# Patient Record
Sex: Male | Born: 1935 | Race: White | Hispanic: No
Health system: Southern US, Community
[De-identification: ages and names within clinical notes are randomized; demographics above are authoritative.]

## PROBLEM LIST (undated history)

## (undated) DIAGNOSIS — C61 Malignant neoplasm of prostate: Secondary | ICD-10-CM

## (undated) DIAGNOSIS — M1990 Unspecified osteoarthritis, unspecified site: Secondary | ICD-10-CM

## (undated) DIAGNOSIS — F0151 Vascular dementia with behavioral disturbance: Secondary | ICD-10-CM

## (undated) DIAGNOSIS — M8448XA Pathological fracture, other site, initial encounter for fracture: Secondary | ICD-10-CM

## (undated) DIAGNOSIS — N400 Enlarged prostate without lower urinary tract symptoms: Secondary | ICD-10-CM

## (undated) DIAGNOSIS — M810 Age-related osteoporosis without current pathological fracture: Secondary | ICD-10-CM

## (undated) DIAGNOSIS — I4892 Unspecified atrial flutter: Secondary | ICD-10-CM

## (undated) DIAGNOSIS — I442 Atrioventricular block, complete: Secondary | ICD-10-CM

## (undated) DIAGNOSIS — I5042 Chronic combined systolic (congestive) and diastolic (congestive) heart failure: Secondary | ICD-10-CM

## (undated) DIAGNOSIS — R634 Abnormal weight loss: Secondary | ICD-10-CM

## (undated) DIAGNOSIS — I509 Heart failure, unspecified: Secondary | ICD-10-CM

## (undated) HISTORY — PX: TONSILLECTOMY: SUR1361

## (undated) HISTORY — DX: Heart failure, unspecified: I50.9

## (undated) HISTORY — DX: Pathological fracture, other site, initial encounter for fracture: M84.48XA

## (undated) HISTORY — DX: Malignant neoplasm of prostate: C61

## (undated) HISTORY — DX: Abnormal weight loss: R63.4

## (undated) HISTORY — DX: Chronic combined systolic (congestive) and diastolic (congestive) heart failure: I50.42

## (undated) HISTORY — DX: Vascular dementia with behavioral disturbance: F01.51

## (undated) HISTORY — DX: Unspecified atrial flutter: I48.92

## (undated) HISTORY — DX: Age-related osteoporosis without current pathological fracture: M81.0

## (undated) HISTORY — PX: ANTERIOR CRUCIATE LIGAMENT REPAIR: SHX115

---

## 2001-05-16 ENCOUNTER — Ambulatory Visit: Admission: RE | Admit: 2001-05-16 | Discharge: 2001-08-14 | Payer: Self-pay | Admitting: Radiation Oncology

## 2001-08-15 ENCOUNTER — Ambulatory Visit: Admission: RE | Admit: 2001-08-15 | Discharge: 2001-11-13 | Payer: Self-pay | Admitting: Radiation Oncology

## 2003-01-20 ENCOUNTER — Ambulatory Visit (HOSPITAL_BASED_OUTPATIENT_CLINIC_OR_DEPARTMENT_OTHER): Admission: RE | Admit: 2003-01-20 | Discharge: 2003-01-20 | Payer: Self-pay | Admitting: Urology

## 2004-03-14 ENCOUNTER — Encounter: Admission: RE | Admit: 2004-03-14 | Discharge: 2004-03-14 | Payer: Self-pay | Admitting: Orthopedic Surgery

## 2004-06-11 LAB — HM COLONOSCOPY

## 2004-07-05 ENCOUNTER — Ambulatory Visit: Payer: Self-pay | Admitting: Internal Medicine

## 2004-07-14 ENCOUNTER — Ambulatory Visit: Payer: Self-pay | Admitting: Internal Medicine

## 2009-07-07 ENCOUNTER — Encounter (INDEPENDENT_AMBULATORY_CARE_PROVIDER_SITE_OTHER): Payer: Self-pay | Admitting: *Deleted

## 2010-02-01 ENCOUNTER — Telehealth: Payer: Self-pay | Admitting: Internal Medicine

## 2010-07-11 NOTE — Progress Notes (Signed)
Summary: Schedule NP3 to Discuss Colonoscopy  Phone Note Outgoing Call Call back at [facility] Phone [phone]   Call placed by: Harlow Mares CMA Duncan Dull),  February 01, 2010 9:51 AM Call placed to: Patient Summary of Call: Left message on patients machine to call back. patient is due for a colonoscopy but will need a office visit to discuss due to age. Initial call taken by: Harlow Mares CMA Duncan Dull),  February 01, 2010 9:52 AM  Follow-up for Phone Call        Patient was told by Dr Jacky Kindle that he did not need a colonoscopy at his age. Follow-up by: Tawni Levy,  February 01, 2010 9:56 AM  Additional Follow-up for Phone Call Additional follow up Details #1::        called patient back and Left a message on patients machine to call back, so i can discuss coming in for an office visit to discuss colonoscopy with Dr. 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 Additional Follow-up by: Harlow Mares CMA Duncan Dull),  February 03, 2010 11:44 AM    Additional Follow-up for Phone Call Additional follow up Details #2::    spoke to the patient he states that he was told by Dr. Jacky Kindle that due to his age he did not need another colonoscopy, I advised him that Dr. Marina Goodell would like to see him in the office to discuss a colonoscopy. He would ike Dr. Jacky Kindle to have an input into if he needs a colonoscopy or not, I advised him I would forward this note to his office and he says he will call them as well. Patient informed he had tubular adenomas on his past colonoscopy in 2006 and he states he is in good heath so i strongly advised him to at least come for an office visit. He still wanted to talk to Dr. Jacky Kindle Follow-up by: Harlow Mares CMA Duncan Dull),  February 03, 2010 2:30 PM

## 2010-07-11 NOTE — Letter (Signed)
Summary: Colonoscopy-Changed to Office Visit Letter  [facility]  [address] [place], [place] [address]   Phone: [phone]  Fax: [phone]      July 07, 2009 MRN: [id number]   KIYOTO SLOMSKI [address] [place], [place]  [address]   Dear Mr. Perillo,   According to our records, it is time for you to schedule a Colonoscopy. However, after reviewing your medical record, I feel that an office visit would be most appropriate to more completely evaluate you and determine your need for a repeat procedure.  Please call [phone] (option #2) at your convenience to schedule an office visit. If you have any questions, concerns, or feel that this letter is in error, we would appreciate your call.   Sincerely,  Wilhemina Bonito. Marina Goodell, M.D.  [facility] [phone]

## 2010-07-12 HISTORY — PX: NM MYOCAR PERF WALL MOTION: HXRAD629

## 2010-08-10 HISTORY — PX: TOTAL HIP ARTHROPLASTY: SHX124

## 2010-08-10 HISTORY — PX: TRANSTHORACIC ECHOCARDIOGRAM: SHX275

## 2010-09-05 ENCOUNTER — Inpatient Hospital Stay (HOSPITAL_COMMUNITY): Payer: Medicare Other

## 2010-09-05 ENCOUNTER — Inpatient Hospital Stay (HOSPITAL_COMMUNITY)
Admission: AD | Admit: 2010-09-05 | Discharge: 2010-09-11 | DRG: 470 | Disposition: A | Discharge status: Home / Self Care | Payer: Medicare Other | Source: Ambulatory Visit | Attending: Orthopedic Surgery | Admitting: Orthopedic Surgery

## 2010-09-05 DIAGNOSIS — I4892 Unspecified atrial flutter: Secondary | ICD-10-CM | POA: Diagnosis not present

## 2010-09-05 DIAGNOSIS — D62 Acute posthemorrhagic anemia: Secondary | ICD-10-CM | POA: Diagnosis not present

## 2010-09-05 DIAGNOSIS — I4891 Unspecified atrial fibrillation: Secondary | ICD-10-CM | POA: Diagnosis present

## 2010-09-05 DIAGNOSIS — I10 Essential (primary) hypertension: Secondary | ICD-10-CM | POA: Diagnosis present

## 2010-09-05 DIAGNOSIS — I4510 Unspecified right bundle-branch block: Secondary | ICD-10-CM | POA: Diagnosis present

## 2010-09-05 DIAGNOSIS — S72009A Fracture of unspecified part of neck of unspecified femur, initial encounter for closed fracture: Principal | ICD-10-CM | POA: Diagnosis present

## 2010-09-05 LAB — TYPE AND SCREEN
ABO/RH(D): A POS
Antibody Screen: NEGATIVE

## 2010-09-05 LAB — CBC
HCT: 40.7 % (ref 39.0–52.0)
Hemoglobin: 14.1 g/dL (ref 13.0–17.0)
MCH: 30.7 pg (ref 26.0–34.0)
MCHC: 34.6 g/dL (ref 30.0–36.0)
MCV: 88.7 fL (ref 78.0–100.0)
Platelets: 206 10*3/uL (ref 150–400)
RBC: 4.59 MIL/uL (ref 4.22–5.81)
RDW: 13.6 % (ref 11.5–15.5)
WBC: 7.4 10*3/uL (ref 4.0–10.5)

## 2010-09-05 LAB — DIFFERENTIAL
Basophils Absolute: 0 10*3/uL (ref 0.0–0.1)
Basophils Relative: 0 % (ref 0–1)
Eosinophils Absolute: 0.1 10*3/uL (ref 0.0–0.7)
Eosinophils Relative: 1 % (ref 0–5)
Lymphocytes Relative: 17 % (ref 12–46)
Lymphs Abs: 1.2 10*3/uL (ref 0.7–4.0)
Monocytes Absolute: 0.8 10*3/uL (ref 0.1–1.0)
Monocytes Relative: 11 % (ref 3–12)
Neutro Abs: 5.2 10*3/uL (ref 1.7–7.7)
Neutrophils Relative %: 71 % (ref 43–77)

## 2010-09-05 LAB — MAGNESIUM: Magnesium: 2.1 mg/dL (ref 1.5–2.5)

## 2010-09-05 LAB — APTT: aPTT: 31 seconds (ref 24–37)

## 2010-09-05 LAB — COMPREHENSIVE METABOLIC PANEL
ALT: 32 U/L (ref 0–53)
AST: 51 U/L — ABNORMAL HIGH (ref 0–37)
Albumin: 3.9 g/dL (ref 3.5–5.2)
Alkaline Phosphatase: 76 U/L (ref 39–117)
BUN: 41 mg/dL — ABNORMAL HIGH (ref 6–23)
CO2: 25 mEq/L (ref 19–32)
Calcium: 9.6 mg/dL (ref 8.4–10.5)
Chloride: 107 mEq/L (ref 96–112)
Creatinine, Ser: 1.18 mg/dL (ref 0.4–1.5)
GFR calc Af Amer: >60 mL/min (ref >60)
GFR calc non Af Amer: 59 mL/min — ABNORMAL LOW (ref >60)
Glucose, Bld: 138 mg/dL — ABNORMAL HIGH (ref 70–99)
Potassium: 3.9 mEq/L (ref 3.5–5.1)
Sodium: 139 mEq/L (ref 135–145)
Total Bilirubin: 1.4 mg/dL — ABNORMAL HIGH (ref 0.3–1.2)
Total Protein: 6.6 g/dL (ref 6.0–8.3)

## 2010-09-05 LAB — SURGICAL PCR SCREEN
MRSA, PCR: NEGATIVE
Staphylococcus aureus: NEGATIVE

## 2010-09-05 LAB — PROTIME-INR
INR: 1.05 (ref 0.00–1.49)
Prothrombin Time: 13.9 seconds (ref 11.6–15.2)

## 2010-09-05 LAB — ABO/RH: ABO/RH(D): A POS

## 2010-09-05 LAB — TSH: TSH: 2.518 u[IU]/mL (ref 0.350–4.500)

## 2010-09-06 ENCOUNTER — Inpatient Hospital Stay (HOSPITAL_COMMUNITY)
Admission: RE | Admit: 2010-09-06 | Payer: PRIVATE HEALTH INSURANCE | Source: Ambulatory Visit | Admitting: Orthopedic Surgery

## 2010-09-06 ENCOUNTER — Inpatient Hospital Stay (HOSPITAL_COMMUNITY): Payer: Medicare Other

## 2010-09-06 ENCOUNTER — Other Ambulatory Visit: Payer: Self-pay | Admitting: Orthopedic Surgery

## 2010-09-06 LAB — COMPREHENSIVE METABOLIC PANEL
ALT: 21 U/L (ref 0–53)
AST: 32 U/L (ref 0–37)
Albumin: 2.8 g/dL — ABNORMAL LOW (ref 3.5–5.2)
Alkaline Phosphatase: 57 U/L (ref 39–117)
BUN: 34 mg/dL — ABNORMAL HIGH (ref 6–23)
CO2: 25 mEq/L (ref 19–32)
Calcium: 8.6 mg/dL (ref 8.4–10.5)
Chloride: 111 mEq/L (ref 96–112)
Creatinine, Ser: 1.19 mg/dL (ref 0.4–1.5)
GFR calc Af Amer: >60 mL/min (ref >60)
GFR calc non Af Amer: 59 mL/min — ABNORMAL LOW (ref >60)
Glucose, Bld: 95 mg/dL (ref 70–99)
Potassium: 3.8 mEq/L (ref 3.5–5.1)
Sodium: 141 mEq/L (ref 135–145)
Total Bilirubin: 0.7 mg/dL (ref 0.3–1.2)
Total Protein: 4.9 g/dL — ABNORMAL LOW (ref 6.0–8.3)

## 2010-09-06 LAB — CBC
HCT: 35.6 % — ABNORMAL LOW (ref 39.0–52.0)
Hemoglobin: 11.9 g/dL — ABNORMAL LOW (ref 13.0–17.0)
MCH: 30.1 pg (ref 26.0–34.0)
MCHC: 33.4 g/dL (ref 30.0–36.0)
MCV: 89.9 fL (ref 78.0–100.0)
Platelets: 176 10*3/uL (ref 150–400)
RBC: 3.96 MIL/uL — ABNORMAL LOW (ref 4.22–5.81)
RDW: 13.8 % (ref 11.5–15.5)
WBC: 5.2 10*3/uL (ref 4.0–10.5)

## 2010-09-07 LAB — BASIC METABOLIC PANEL
BUN: 18 mg/dL (ref 6–23)
CO2: 28 mEq/L (ref 19–32)
Calcium: 8.3 mg/dL — ABNORMAL LOW (ref 8.4–10.5)
Chloride: 105 mEq/L (ref 96–112)
Creatinine, Ser: 0.94 mg/dL (ref 0.4–1.5)
GFR calc Af Amer: >60 mL/min (ref >60)
GFR calc non Af Amer: >60 mL/min (ref >60)
Glucose, Bld: 109 mg/dL — ABNORMAL HIGH (ref 70–99)
Potassium: 3.8 mEq/L (ref 3.5–5.1)
Sodium: 140 mEq/L (ref 135–145)

## 2010-09-07 LAB — CBC
HCT: 30.7 % — ABNORMAL LOW (ref 39.0–52.0)
Hemoglobin: 10.3 g/dL — ABNORMAL LOW (ref 13.0–17.0)
MCH: 29.9 pg (ref 26.0–34.0)
MCHC: 33.6 g/dL (ref 30.0–36.0)
MCV: 89 fL (ref 78.0–100.0)
Platelets: 173 10*3/uL (ref 150–400)
RBC: 3.45 MIL/uL — ABNORMAL LOW (ref 4.22–5.81)
RDW: 13.7 % (ref 11.5–15.5)
WBC: 5.5 10*3/uL (ref 4.0–10.5)

## 2010-09-08 LAB — CBC
HCT: 30.6 % — ABNORMAL LOW (ref 39.0–52.0)
Hemoglobin: 10.2 g/dL — ABNORMAL LOW (ref 13.0–17.0)
MCH: 29.9 pg (ref 26.0–34.0)
MCHC: 33.3 g/dL (ref 30.0–36.0)
MCV: 89.7 fL (ref 78.0–100.0)
Platelets: 166 10*3/uL (ref 150–400)
RBC: 3.41 MIL/uL — ABNORMAL LOW (ref 4.22–5.81)
RDW: 13.8 % (ref 11.5–15.5)
WBC: 7 10*3/uL (ref 4.0–10.5)

## 2010-09-08 LAB — BASIC METABOLIC PANEL
BUN: 17 mg/dL (ref 6–23)
CO2: 27 mEq/L (ref 19–32)
Calcium: 8.4 mg/dL (ref 8.4–10.5)
Chloride: 105 mEq/L (ref 96–112)
Creatinine, Ser: 0.94 mg/dL (ref 0.4–1.5)
GFR calc Af Amer: >60 mL/min (ref >60)
GFR calc non Af Amer: >60 mL/min (ref >60)
Glucose, Bld: 121 mg/dL — ABNORMAL HIGH (ref 70–99)
Potassium: 3.8 mEq/L (ref 3.5–5.1)
Sodium: 139 mEq/L (ref 135–145)

## 2010-09-09 LAB — BASIC METABOLIC PANEL
BUN: 22 mg/dL (ref 6–23)
CO2: 29 mEq/L (ref 19–32)
Chloride: 107 mEq/L (ref 96–112)
Creatinine, Ser: 1 mg/dL (ref 0.4–1.5)
GFR calc Af Amer: >60 mL/min (ref >60)
GFR calc non Af Amer: >60 mL/min (ref >60)
Glucose, Bld: 109 mg/dL — ABNORMAL HIGH (ref 70–99)
Potassium: 4.1 mEq/L (ref 3.5–5.1)
Sodium: 141 mEq/L (ref 135–145)

## 2010-09-09 LAB — CBC
HCT: 28.5 % — ABNORMAL LOW (ref 39.0–52.0)
MCH: 30.1 pg (ref 26.0–34.0)
MCHC: 33.7 g/dL (ref 30.0–36.0)
MCV: 89.3 fL (ref 78.0–100.0)
Platelets: 178 10*3/uL (ref 150–400)
RBC: 3.19 MIL/uL — ABNORMAL LOW (ref 4.22–5.81)
RDW: 13.7 % (ref 11.5–15.5)
WBC: 7.2 10*3/uL (ref 4.0–10.5)

## 2010-09-10 LAB — PROTIME-INR: INR: 1.11 (ref 0.00–1.49)

## 2010-09-11 LAB — URINALYSIS, ROUTINE W REFLEX MICROSCOPIC
Glucose, UA: NEGATIVE mg/dL
Protein, ur: NEGATIVE mg/dL
Specific Gravity, Urine: 1.022 (ref 1.005–1.030)
pH: 6 (ref 5.0–8.0)

## 2010-09-19 NOTE — Consult Note (Signed)
NAME:  Justin Orozco, Justin Orozco NO.:  [id number]  MEDICAL RECORD NO.:  [id number]           PATIENT TYPE:  I  LOCATION:  5034                         FACILITY:  [facility]  PHYSICIAN:  Nanetta Batty, M.D.   DATE OF BIRTH:  10-20-1926  DATE OF CONSULTATION:  09/05/2010 DATE OF DISCHARGE:                                CONSULTATION   Justin Orozco is a pleasant 77 year old male who was referred to Dr. Allyson Sabal earlier this year for an incidental finding of atrial fibrillation. Apparently, the patient has paroxysmal atrial fibrillation with a sick sinus component.  On the monitor as an outpatient, he had a 3.7-second pause but he was asymptomatic.  He denies any syncope or chest pain or unusual dyspnea.  He did have a Myoview study in our office in February of this year that was low risk with diaphragmatic attenuation.  It was not gaited because of paroxysmal atrial flutter.  He was actually scheduled to have an echocardiogram in our office today.  He went to Dr. Candise Bowens office this morning with complaints of some hip pain and apparently the patient has a fracture in his hip.  He is admitted now for hip surgery tomorrow and we are asked to see him for preop clearance.  As noted above, the patient has no chest pain, shortness of breath, dizziness, or syncope.  PAST MEDICAL HISTORY:  Remarkable for BPH.  HOME MEDICATIONS:  As of our last office visit are aspirin 325 mg a day, metoprolol 25 mg b.i.d., and Flomax 0.4 mg a day.  ALLERGIES:  He has no known drug allergies.  SOCIAL HISTORY:  He is widowed with 2 children, 4 grandchildren.  He is nonsmoker, nondrinker, he drinks alcohol occasionally.  FAMILY HISTORY:  Unremarkable.  REVIEW OF SYSTEMS:  Essentially unremarkable except for noted above, there is no history of diabetes.  PHYSICAL EXAMINATION:  VITAL SIGNS:  Blood pressure 128/72, pulse 72, respirations 12. GENERAL:  He is an elderly male in no acute distress. HEENT:   Normocephalic.  Extraocular movements are intact.  Sclerae are nonicteric. NECK:  Without JVD or bruit. CHEST:  Clear to auscultation and percussion. CARDIAC:  Reveals regular rate and rhythm without obvious murmur, rub, or gallop.  Normal S1, S2. ABDOMEN:  Nontender, not distended. EXTREMITIES:  Reveal right hip pain. NEURO:  Grossly intact.  He is awake, alert, oriented, and cooperative.  Labs are pending.  His EKG September 05, 2010 shows sinus rhythm with a right bundle branch block and left anterior fascicular block.  IMPRESSION: 1. Paroxysmal atrial fibrillation and sick sinus syndrome, currently     in sinus rhythm. 2. Right bundle branch block. 3. Low-risk Myoview February 2012. 4. Right hip fracture.  PLAN:  The patient was seen by Dr. Lynnea Ferrier and myself.  We will go ahead and get his echocardiogram this afternoon.  Pending any unexpected findings on his echocardiogram, he will be cleared for surgery.  He will need telemetry postop.  We will follow along during his hospitalization.     Abelino Derrick, P.A.  ______________________________ Nanetta Batty, M.D.    Lenard Lance  D:  09/05/2010  T:  09/05/2010  Job:  [id number]  cc:   Nanetta Batty, M.D. Geoffry Paradise, M.D.  Electronically Signed by Corine Shelter P.A. on 09/08/2010 02:42:53 PM Electronically Signed by Nanetta Batty M.D. on 09/19/2010 10:45:42 AM

## 2010-10-09 NOTE — Op Note (Signed)
  NAME:  Justin Orozco, Justin Orozco NO.:  [id number]  MEDICAL RECORD NO.:  [id number]           PATIENT TYPE:  I  LOCATION:  4708                         FACILITY:  [facility]  PHYSICIAN:  Dyke Brackett, M.D.    DATE OF BIRTH:  September 13, 1926  DATE OF PROCEDURE:  09/06/2010 DATE OF DISCHARGE:                              OPERATIVE REPORT   PREOPERATIVE DIAGNOSIS:  Right displaced femoral neck fracture.  POSTOPERATIVE DIAGNOSIS:  Right displaced femoral neck fracture.  OPERATION:  Right DePuy hemiarthroplasty (Prodigy porous-coated stem, 15 large stature, +0 neck length, and 49-mm unipolar hip ball).  SURGEON:  Dyke Brackett, MD  ASSISTANTS:  [name]. Su Hilt, [place]  BLOOD LOSS:  300.  General anesthetic.  DESCRIPTION OF THE PROCEDURE:  Sterile prep and drape, lateral position, posterior approach to the hip made with a splitting iliotibial band, gluteus maximus fascia.  Identification of the sciatic nerve, followed by splitting the short external rotators of the hip.  Capsulotomy of the capsule revealing a rather high vertically oriented neck fracture, appeared to be more consistent with osteoporosis, but we did send the head to Pathology.  Head was removed with excess bone entrapments from the acetabulum.  The acetabulum showed no significant degenerative change.  We then proceeded to size the acetabulum and the head to be 49 mm.  Following this, we progressively reamed up to accept a 50-mm large stature stem with the appropriate reamers to 14.5 and appropriate broach obtaining good press fit, particularly given the patient's age of approximately 50 that he did have reasonably good bone with a Press-Fit. Again, all bony surfaces were irrigated.  We trialed the prosthesis with the broach with +0 neck length being deemed appropriate with excellent stability noted.  We followed this with placement of the real prosthesis with the appropriate anteversion, porous-coated Prodigy  stem.  We did note that the Prodigy provided slightly more stability than the AML stem and elected to use the Prodigy of the above-mentioned neck lengths.  The final prosthesis was inserted followed by one additional trailing with appropriate neck length.  We then placed the final head and then checked all parameters were excellent.  It could be dislocated with probably greater than 90 degrees flexion, 30-40 degrees adduction, and greater than 60 degrees of internal rotation.  T- capsulotomy was repaired with interrupted Ethibond.  The fascia lata with a running #1 Ethibond, subcutaneous tissues with 2-0 Vicryl and skin clips.  Marcaine with epinephrine was then infiltrated in the skin and placed in sterile dressing, knee immobilizer, taken to the recovery room in stable condition.     Dyke Brackett, M.D.     WDC/MEDQ  D:  09/06/2010  T:  09/07/2010  Job:  [id number]  Electronically Signed by W. Jerrett Baldinger M.D. on 10/09/2010 05:11:25 PM

## 2010-10-26 NOTE — Discharge Summary (Signed)
NAME:  Justin Orozco, Justin Orozco NO.:  [id number]  MEDICAL RECORD NO.:  [id number]           PATIENT TYPE:  I  LOCATION:  5004                         FACILITY:  [facility]  PHYSICIAN:  Justin Orozco, M.D.    DATE OF BIRTH:  [birth year]  DATE OF ADMISSION:  09/05/2010 DATE OF DISCHARGE:  09/11/2010                              DISCHARGE SUMMARY   DISCHARGE SUMMARY:  The patient is an 77 year old with a 2-week history of right hip pain who was initially able to ambulate, but pain worsened significantly over the last several days prior to this admission. Office visit on September 05, 2010, found a right femoral neck fracture, and the patient will be admitted for right hemi hip arthroplasty due to the fracture.  PAST MEDICAL HISTORY:  Significant for benign prostatic hypertrophy, AFib, and right bundle-branch block for his cardiac history.  FAMILY HISTORY:  Unremarkable.  SOCIAL HISTORY:  The patient is a widower who lives alone with supportive family nearby.  He does use occasional alcohol with no tobacco.  No known drug allergies.  MEDICATIONS ON ADMISSION: 1. Aspirin 325 mg daily. 2. Metoprolol 25 mg b.i.d. 3. Flomax 0.4 mg daily.  REVIEW OF SYSTEMS:  Positive for glasses.  He does not have dentures. He denies any shortness of breath, chest pain, or recent illness.  PHYSICAL EXAMINATION:  VITAL SIGNS:  The patient is afebrile, pulse 72, respirations are 12, blood pressure 128/72.  He is 5 feet 9 inches, 144- pound male. NECK:  Full range of motion. CHEST:  Clear to auscultation. HEART:  Regular rate and rhythm. ABDOMEN:  Bowel sounds 2+ and nondistended.  No masses. EXTREMITIES:  Right lower extremity with pain with any attempted movement.  He is otherwise neurovascularly intact to both lower extremities. SKIN:  Within normal limits. NEUROLOGIC:  The patient intact to light touch and moves all extremities.  He is alert and oriented x3.  Cranial nerves II-XII  are intact.  PREOPERATIVE LABORATORIES:  Hemoglobin of 14.1, WBC was 7.4.  PT is 13.9, INR is 31.  BUN of 41, and CO2 of 51.  X-rays show a right femoral neck fracture.  The patient was admitted for pain control the night prior to the procedure as well as a visit from his cardiologist to make sure that he was cleared for surgery due to his previous history of AFib and bundle branch block.  HOSPITAL COURSE:  The patient received clearance for surgery on September 05, 2010, and his surgery was planned for September 06, 2010.  On September 06, 2010, the patient underwent a right hemi hip arthroplasty with a 48-mm head, Prodigy stem by DePuy with 15 mm diameter and large stature and a tapered spacer of 0 mm.  The patient was placed on perioperative antibiotics.  She was placed on postoperative Lovenox prophylaxis who began physical therapy the day of surgery.  POSTOPERATIVE COURSE:  On first postoperative day, the patient was awake and alert, complaining of moderate pain.  No complaints of nausea or vomiting.  Wound was clean and dry.  He was on the telemetry floor for cardiac monitoring and did  show some abnormal rhythms, which kept him on the cardiac floor for continued monitoring and safekeeping.  He was able to undergo at least passive physical therapy in bed, however.  On postoperative day #2, the patient was clear to transfer to 5000 if okayed by Cardiology.  He continued complaining of only moderate pain and was tolerating diet without difficulty.  On postoperative day #3, the patient was able to transfer to the orthopedic floor.  He was making steady progress with physical therapy.  Wound was clean and dry.  With supportive family, it was felt he was cleared to be discharged to his home in their care.  At the time of his discharge, his wound was clean and dry.  He was medically stable and improving orthopedically.  ACTIVITIES:  Weightbearing as tolerated with walker.  Dressing changes  daily or as needed to keep the wound clean and dry and covered.  Diet was regular.  DISCHARGE MEDICATIONS: 1. Tylenol one to two pills by mouth every 6 hours as needed for     temperature greater than 101. 2. Vicodin 5/325 one to two tablets by mouth every 4 hours as needed     for pain. 3. TED hose for continued DVT prophylaxis. 4. Methocarbamol 500 mg one p.o. q.6 h. as needed for spasm. 5. Coumadin as directed by home pharmacy per pharmacy protocol for     prophylaxis. 6. Metoprolol 50 mg one-half tablet by mouth b.i.d. 7. Tamsulosin 0.4 mg one p.o. daily.  The patient will follow up with Dr. Madelon Orozco in approximately 1 week to 10 days.  He will follow up with Cardiology per their schedule, and they will call him for that appointment.  He will return sooner if there is any increase in temperature greater than 101, any uncontrolled drainage, or any increased swelling or pain around the area of the wound of the hip.  Once again, the diagnosis for this admission was right hip femoral neck fracture.  PROCEDURE:  Right hemi hip arthroplasty.     [name]. Justin Orozco   ______________________________ Justin Orozco, M.D.   JBR/MEDQ  D:  10/11/2010  T:  10/12/2010  Job:  [id number]  Electronically Signed by Justin Orozco P.A. on 10/23/2010 01:31:09 PM Electronically Signed by Justin Orozco M.D. on 10/26/2010 11:13:02 AM

## 2011-02-26 ENCOUNTER — Other Ambulatory Visit: Payer: Self-pay | Admitting: Dermatology

## 2011-07-09 DIAGNOSIS — Z79899 Other long term (current) drug therapy: Secondary | ICD-10-CM | POA: Diagnosis not present

## 2011-07-09 DIAGNOSIS — M810 Age-related osteoporosis without current pathological fracture: Secondary | ICD-10-CM | POA: Diagnosis not present

## 2011-07-09 DIAGNOSIS — E785 Hyperlipidemia, unspecified: Secondary | ICD-10-CM | POA: Diagnosis not present

## 2011-07-09 DIAGNOSIS — Z125 Encounter for screening for malignant neoplasm of prostate: Secondary | ICD-10-CM | POA: Diagnosis not present

## 2011-07-16 DIAGNOSIS — I4892 Unspecified atrial flutter: Secondary | ICD-10-CM | POA: Diagnosis not present

## 2011-07-16 DIAGNOSIS — E785 Hyperlipidemia, unspecified: Secondary | ICD-10-CM | POA: Diagnosis not present

## 2011-07-16 DIAGNOSIS — Z0000 Encounter for general adult medical examination without abnormal findings: Secondary | ICD-10-CM | POA: Diagnosis not present

## 2011-07-16 DIAGNOSIS — M1990 Unspecified osteoarthritis, unspecified site: Secondary | ICD-10-CM | POA: Diagnosis not present

## 2011-07-17 DIAGNOSIS — Z1212 Encounter for screening for malignant neoplasm of rectum: Secondary | ICD-10-CM | POA: Diagnosis not present

## 2011-07-19 DIAGNOSIS — Z125 Encounter for screening for malignant neoplasm of prostate: Secondary | ICD-10-CM | POA: Diagnosis not present

## 2011-08-15 DIAGNOSIS — M999 Biomechanical lesion, unspecified: Secondary | ICD-10-CM | POA: Diagnosis not present

## 2011-08-15 DIAGNOSIS — S239XXA Sprain of unspecified parts of thorax, initial encounter: Secondary | ICD-10-CM | POA: Diagnosis not present

## 2011-08-15 DIAGNOSIS — M47817 Spondylosis without myelopathy or radiculopathy, lumbosacral region: Secondary | ICD-10-CM | POA: Diagnosis not present

## 2011-09-04 DIAGNOSIS — L578 Other skin changes due to chronic exposure to nonionizing radiation: Secondary | ICD-10-CM | POA: Diagnosis not present

## 2011-09-04 DIAGNOSIS — L570 Actinic keratosis: Secondary | ICD-10-CM | POA: Diagnosis not present

## 2011-09-04 DIAGNOSIS — L821 Other seborrheic keratosis: Secondary | ICD-10-CM | POA: Diagnosis not present

## 2011-11-26 DIAGNOSIS — H2510 Age-related nuclear cataract, unspecified eye: Secondary | ICD-10-CM | POA: Diagnosis not present

## 2011-11-27 ENCOUNTER — Other Ambulatory Visit: Payer: Self-pay | Admitting: Dermatology

## 2011-11-27 DIAGNOSIS — L578 Other skin changes due to chronic exposure to nonionizing radiation: Secondary | ICD-10-CM | POA: Diagnosis not present

## 2011-11-27 DIAGNOSIS — L570 Actinic keratosis: Secondary | ICD-10-CM | POA: Diagnosis not present

## 2011-11-27 DIAGNOSIS — C44721 Squamous cell carcinoma of skin of unspecified lower limb, including hip: Secondary | ICD-10-CM | POA: Diagnosis not present

## 2011-11-27 DIAGNOSIS — L821 Other seborrheic keratosis: Secondary | ICD-10-CM | POA: Diagnosis not present

## 2011-11-27 DIAGNOSIS — L819 Disorder of pigmentation, unspecified: Secondary | ICD-10-CM | POA: Diagnosis not present

## 2011-11-28 DIAGNOSIS — S239XXA Sprain of unspecified parts of thorax, initial encounter: Secondary | ICD-10-CM | POA: Diagnosis not present

## 2011-11-28 DIAGNOSIS — M47817 Spondylosis without myelopathy or radiculopathy, lumbosacral region: Secondary | ICD-10-CM | POA: Diagnosis not present

## 2011-11-28 DIAGNOSIS — M999 Biomechanical lesion, unspecified: Secondary | ICD-10-CM | POA: Diagnosis not present

## 2011-12-19 DIAGNOSIS — I4891 Unspecified atrial fibrillation: Secondary | ICD-10-CM | POA: Diagnosis not present

## 2012-02-21 DIAGNOSIS — Z23 Encounter for immunization: Secondary | ICD-10-CM | POA: Diagnosis not present

## 2012-02-28 DIAGNOSIS — S239XXA Sprain of unspecified parts of thorax, initial encounter: Secondary | ICD-10-CM | POA: Diagnosis not present

## 2012-02-28 DIAGNOSIS — M999 Biomechanical lesion, unspecified: Secondary | ICD-10-CM | POA: Diagnosis not present

## 2012-02-28 DIAGNOSIS — M47817 Spondylosis without myelopathy or radiculopathy, lumbosacral region: Secondary | ICD-10-CM | POA: Diagnosis not present

## 2012-03-04 DIAGNOSIS — L821 Other seborrheic keratosis: Secondary | ICD-10-CM | POA: Diagnosis not present

## 2012-03-04 DIAGNOSIS — L570 Actinic keratosis: Secondary | ICD-10-CM | POA: Diagnosis not present

## 2012-03-04 DIAGNOSIS — L820 Inflamed seborrheic keratosis: Secondary | ICD-10-CM | POA: Diagnosis not present

## 2012-03-04 DIAGNOSIS — L819 Disorder of pigmentation, unspecified: Secondary | ICD-10-CM | POA: Diagnosis not present

## 2012-03-04 DIAGNOSIS — Z85828 Personal history of other malignant neoplasm of skin: Secondary | ICD-10-CM | POA: Diagnosis not present

## 2012-03-04 DIAGNOSIS — L578 Other skin changes due to chronic exposure to nonionizing radiation: Secondary | ICD-10-CM | POA: Diagnosis not present

## 2012-04-23 DIAGNOSIS — C61 Malignant neoplasm of prostate: Secondary | ICD-10-CM | POA: Diagnosis not present

## 2012-04-27 IMAGING — CR DG HIP 1V PORT*R*
1 series · 1 of 1 positions shown · non-contrast
Comparison: None.

CLINICAL DATA: Right hip pain

PORTABLE RIGHT HIP - 1 VIEW

[view not recorded]
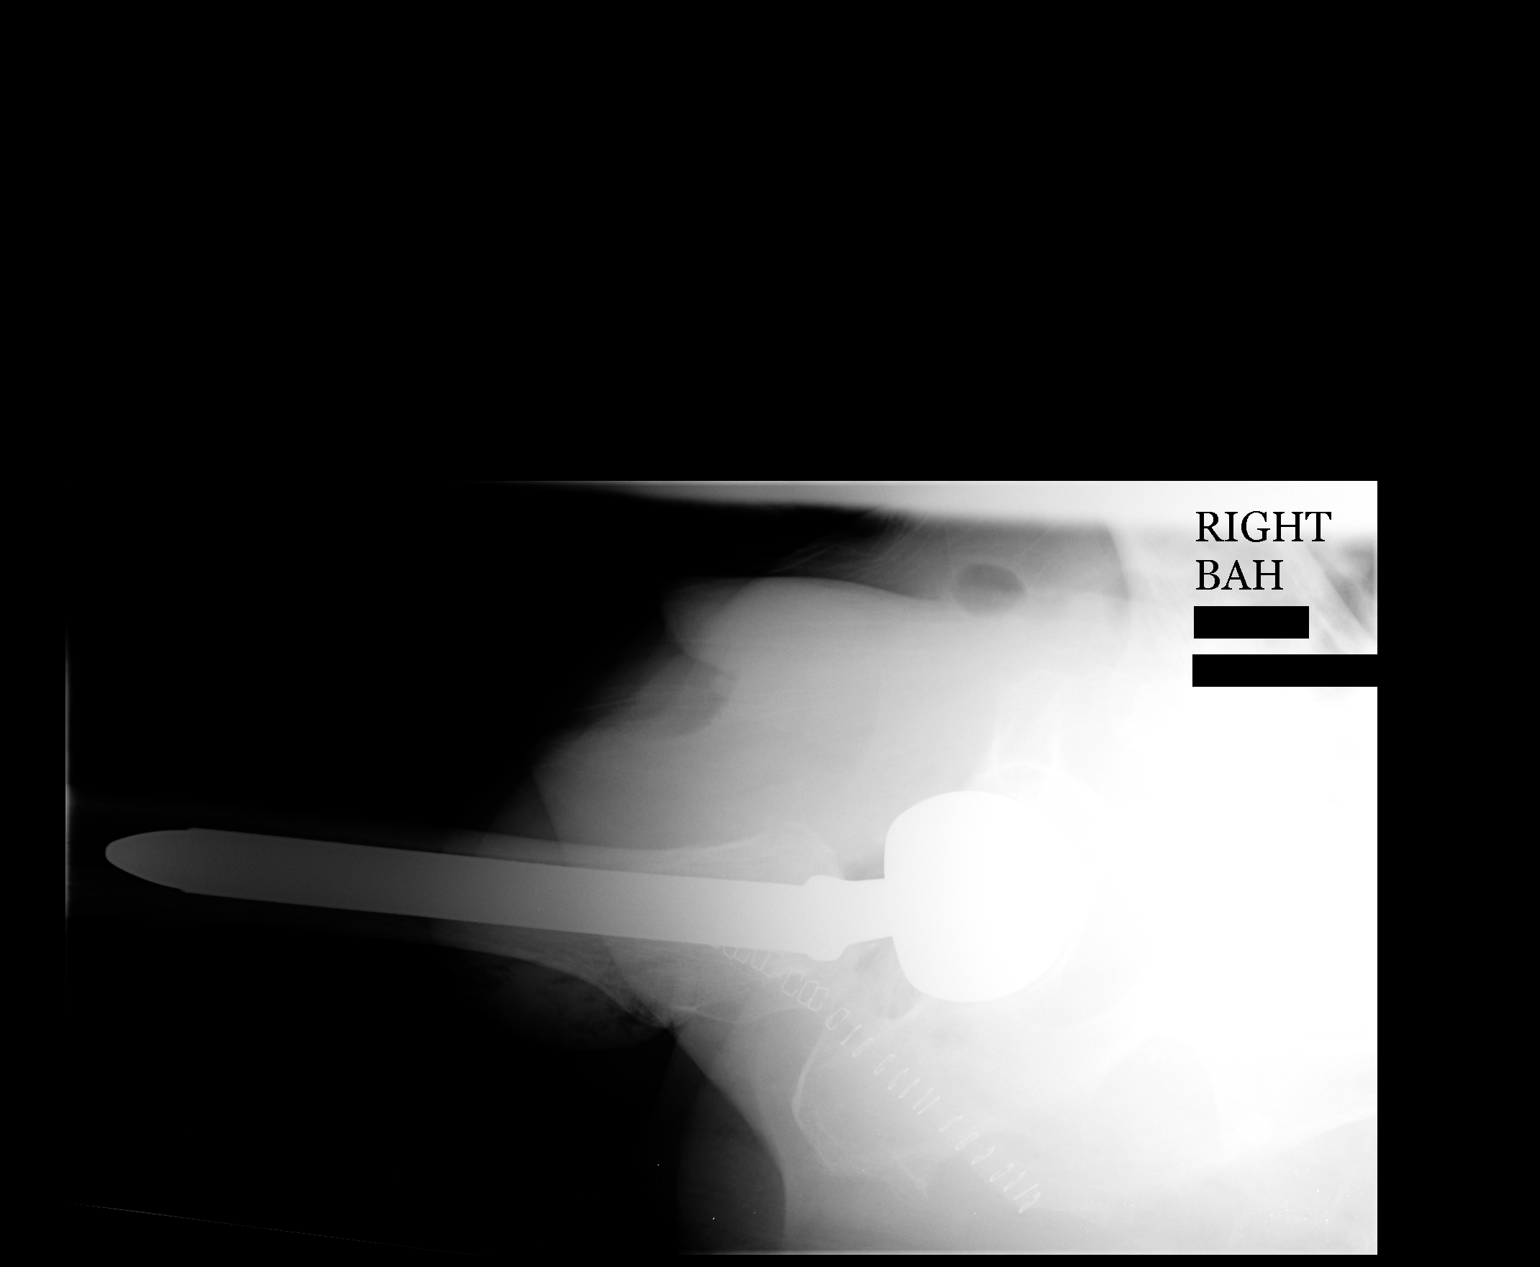

[1 of 1 positions shown; findings below may reference images not displayed]

FINDINGS: Lateral view status post right total hip replacement
demonstrates satisfactory position and alignment.
IMPRESSION: As above.

## 2012-04-30 DIAGNOSIS — N320 Bladder-neck obstruction: Secondary | ICD-10-CM | POA: Diagnosis not present

## 2012-04-30 DIAGNOSIS — C61 Malignant neoplasm of prostate: Secondary | ICD-10-CM | POA: Diagnosis not present

## 2012-05-27 ENCOUNTER — Other Ambulatory Visit: Payer: Self-pay | Admitting: Dermatology

## 2012-05-27 DIAGNOSIS — L819 Disorder of pigmentation, unspecified: Secondary | ICD-10-CM | POA: Diagnosis not present

## 2012-05-27 DIAGNOSIS — C44319 Basal cell carcinoma of skin of other parts of face: Secondary | ICD-10-CM | POA: Diagnosis not present

## 2012-05-27 DIAGNOSIS — L259 Unspecified contact dermatitis, unspecified cause: Secondary | ICD-10-CM | POA: Diagnosis not present

## 2012-05-27 DIAGNOSIS — L821 Other seborrheic keratosis: Secondary | ICD-10-CM | POA: Diagnosis not present

## 2012-05-27 DIAGNOSIS — D485 Neoplasm of uncertain behavior of skin: Secondary | ICD-10-CM | POA: Diagnosis not present

## 2012-05-27 DIAGNOSIS — L578 Other skin changes due to chronic exposure to nonionizing radiation: Secondary | ICD-10-CM | POA: Diagnosis not present

## 2012-06-27 DIAGNOSIS — S239XXA Sprain of unspecified parts of thorax, initial encounter: Secondary | ICD-10-CM | POA: Diagnosis not present

## 2012-06-27 DIAGNOSIS — M999 Biomechanical lesion, unspecified: Secondary | ICD-10-CM | POA: Diagnosis not present

## 2012-06-27 DIAGNOSIS — M47817 Spondylosis without myelopathy or radiculopathy, lumbosacral region: Secondary | ICD-10-CM | POA: Diagnosis not present

## 2012-06-27 DIAGNOSIS — S335XXA Sprain of ligaments of lumbar spine, initial encounter: Secondary | ICD-10-CM | POA: Diagnosis not present

## 2012-07-10 DIAGNOSIS — E785 Hyperlipidemia, unspecified: Secondary | ICD-10-CM | POA: Diagnosis not present

## 2012-07-10 DIAGNOSIS — Z79899 Other long term (current) drug therapy: Secondary | ICD-10-CM | POA: Diagnosis not present

## 2012-07-10 DIAGNOSIS — Z125 Encounter for screening for malignant neoplasm of prostate: Secondary | ICD-10-CM | POA: Diagnosis not present

## 2012-07-10 DIAGNOSIS — M810 Age-related osteoporosis without current pathological fracture: Secondary | ICD-10-CM | POA: Diagnosis not present

## 2012-07-16 DIAGNOSIS — I4892 Unspecified atrial flutter: Secondary | ICD-10-CM | POA: Diagnosis not present

## 2012-07-16 DIAGNOSIS — Z0000 Encounter for general adult medical examination without abnormal findings: Secondary | ICD-10-CM | POA: Diagnosis not present

## 2012-07-16 DIAGNOSIS — Z79899 Other long term (current) drug therapy: Secondary | ICD-10-CM | POA: Diagnosis not present

## 2012-07-16 DIAGNOSIS — Z23 Encounter for immunization: Secondary | ICD-10-CM | POA: Diagnosis not present

## 2012-07-16 DIAGNOSIS — Z1331 Encounter for screening for depression: Secondary | ICD-10-CM | POA: Diagnosis not present

## 2012-07-16 DIAGNOSIS — E785 Hyperlipidemia, unspecified: Secondary | ICD-10-CM | POA: Diagnosis not present

## 2012-07-18 DIAGNOSIS — Z1212 Encounter for screening for malignant neoplasm of rectum: Secondary | ICD-10-CM | POA: Diagnosis not present

## 2012-07-28 DIAGNOSIS — C44319 Basal cell carcinoma of skin of other parts of face: Secondary | ICD-10-CM | POA: Diagnosis not present

## 2012-09-19 DIAGNOSIS — S335XXA Sprain of ligaments of lumbar spine, initial encounter: Secondary | ICD-10-CM | POA: Diagnosis not present

## 2012-09-19 DIAGNOSIS — M47817 Spondylosis without myelopathy or radiculopathy, lumbosacral region: Secondary | ICD-10-CM | POA: Diagnosis not present

## 2012-09-19 DIAGNOSIS — S239XXA Sprain of unspecified parts of thorax, initial encounter: Secondary | ICD-10-CM | POA: Diagnosis not present

## 2012-09-19 DIAGNOSIS — M999 Biomechanical lesion, unspecified: Secondary | ICD-10-CM | POA: Diagnosis not present

## 2012-11-25 DIAGNOSIS — L819 Disorder of pigmentation, unspecified: Secondary | ICD-10-CM | POA: Diagnosis not present

## 2012-11-25 DIAGNOSIS — L578 Other skin changes due to chronic exposure to nonionizing radiation: Secondary | ICD-10-CM | POA: Diagnosis not present

## 2012-11-25 DIAGNOSIS — L821 Other seborrheic keratosis: Secondary | ICD-10-CM | POA: Diagnosis not present

## 2012-11-25 DIAGNOSIS — Z85828 Personal history of other malignant neoplasm of skin: Secondary | ICD-10-CM | POA: Diagnosis not present

## 2012-12-03 DIAGNOSIS — H35379 Puckering of macula, unspecified eye: Secondary | ICD-10-CM | POA: Diagnosis not present

## 2012-12-03 DIAGNOSIS — H2510 Age-related nuclear cataract, unspecified eye: Secondary | ICD-10-CM | POA: Diagnosis not present

## 2012-12-26 DIAGNOSIS — M47817 Spondylosis without myelopathy or radiculopathy, lumbosacral region: Secondary | ICD-10-CM | POA: Diagnosis not present

## 2012-12-26 DIAGNOSIS — S335XXA Sprain of ligaments of lumbar spine, initial encounter: Secondary | ICD-10-CM | POA: Diagnosis not present

## 2012-12-26 DIAGNOSIS — M999 Biomechanical lesion, unspecified: Secondary | ICD-10-CM | POA: Diagnosis not present

## 2012-12-26 DIAGNOSIS — S239XXA Sprain of unspecified parts of thorax, initial encounter: Secondary | ICD-10-CM | POA: Diagnosis not present

## 2013-02-25 DIAGNOSIS — Z85828 Personal history of other malignant neoplasm of skin: Secondary | ICD-10-CM | POA: Diagnosis not present

## 2013-02-25 DIAGNOSIS — L723 Sebaceous cyst: Secondary | ICD-10-CM | POA: Diagnosis not present

## 2013-02-25 DIAGNOSIS — L570 Actinic keratosis: Secondary | ICD-10-CM | POA: Diagnosis not present

## 2013-02-25 DIAGNOSIS — L821 Other seborrheic keratosis: Secondary | ICD-10-CM | POA: Diagnosis not present

## 2013-03-27 DIAGNOSIS — Z23 Encounter for immunization: Secondary | ICD-10-CM | POA: Diagnosis not present

## 2013-04-01 DIAGNOSIS — M47817 Spondylosis without myelopathy or radiculopathy, lumbosacral region: Secondary | ICD-10-CM | POA: Diagnosis not present

## 2013-04-01 DIAGNOSIS — M999 Biomechanical lesion, unspecified: Secondary | ICD-10-CM | POA: Diagnosis not present

## 2013-04-01 DIAGNOSIS — S239XXA Sprain of unspecified parts of thorax, initial encounter: Secondary | ICD-10-CM | POA: Diagnosis not present

## 2013-05-01 DIAGNOSIS — C61 Malignant neoplasm of prostate: Secondary | ICD-10-CM | POA: Diagnosis not present

## 2013-05-01 DIAGNOSIS — R351 Nocturia: Secondary | ICD-10-CM | POA: Diagnosis not present

## 2013-05-01 DIAGNOSIS — Z8546 Personal history of malignant neoplasm of prostate: Secondary | ICD-10-CM | POA: Diagnosis not present

## 2013-07-04 ENCOUNTER — Other Ambulatory Visit: Payer: Self-pay | Admitting: Cardiovascular Disease

## 2013-07-13 DIAGNOSIS — E785 Hyperlipidemia, unspecified: Secondary | ICD-10-CM | POA: Diagnosis not present

## 2013-07-13 DIAGNOSIS — M810 Age-related osteoporosis without current pathological fracture: Secondary | ICD-10-CM | POA: Diagnosis not present

## 2013-07-13 DIAGNOSIS — Z125 Encounter for screening for malignant neoplasm of prostate: Secondary | ICD-10-CM | POA: Diagnosis not present

## 2013-07-13 DIAGNOSIS — Z79899 Other long term (current) drug therapy: Secondary | ICD-10-CM | POA: Diagnosis not present

## 2013-07-17 DIAGNOSIS — E785 Hyperlipidemia, unspecified: Secondary | ICD-10-CM | POA: Diagnosis not present

## 2013-07-17 DIAGNOSIS — Z8546 Personal history of malignant neoplasm of prostate: Secondary | ICD-10-CM | POA: Diagnosis not present

## 2013-07-17 DIAGNOSIS — M1990 Unspecified osteoarthritis, unspecified site: Secondary | ICD-10-CM | POA: Diagnosis not present

## 2013-07-17 DIAGNOSIS — Z1331 Encounter for screening for depression: Secondary | ICD-10-CM | POA: Diagnosis not present

## 2013-07-17 DIAGNOSIS — I4892 Unspecified atrial flutter: Secondary | ICD-10-CM | POA: Diagnosis not present

## 2013-07-17 DIAGNOSIS — IMO0002 Reserved for concepts with insufficient information to code with codable children: Secondary | ICD-10-CM | POA: Diagnosis not present

## 2013-07-17 DIAGNOSIS — Z0000 Encounter for general adult medical examination without abnormal findings: Secondary | ICD-10-CM | POA: Diagnosis not present

## 2013-07-17 DIAGNOSIS — Z125 Encounter for screening for malignant neoplasm of prostate: Secondary | ICD-10-CM | POA: Diagnosis not present

## 2013-07-18 ENCOUNTER — Other Ambulatory Visit: Payer: Self-pay | Admitting: Cardiovascular Disease

## 2013-07-20 DIAGNOSIS — M47817 Spondylosis without myelopathy or radiculopathy, lumbosacral region: Secondary | ICD-10-CM | POA: Diagnosis not present

## 2013-07-20 DIAGNOSIS — S239XXA Sprain of unspecified parts of thorax, initial encounter: Secondary | ICD-10-CM | POA: Diagnosis not present

## 2013-07-20 DIAGNOSIS — M999 Biomechanical lesion, unspecified: Secondary | ICD-10-CM | POA: Diagnosis not present

## 2013-07-22 DIAGNOSIS — Z1212 Encounter for screening for malignant neoplasm of rectum: Secondary | ICD-10-CM | POA: Diagnosis not present

## 2013-07-29 DIAGNOSIS — K921 Melena: Secondary | ICD-10-CM | POA: Diagnosis not present

## 2013-08-09 ENCOUNTER — Other Ambulatory Visit: Payer: Self-pay | Admitting: Cardiovascular Disease

## 2013-08-09 DIAGNOSIS — I442 Atrioventricular block, complete: Secondary | ICD-10-CM

## 2013-08-09 HISTORY — DX: Atrioventricular block, complete: I44.2

## 2013-08-10 NOTE — Telephone Encounter (Signed)
E-sent- refuse rx -- Last visit 12/2011

## 2013-08-10 NOTE — Telephone Encounter (Signed)
FORWARD TO  MED REC NEED PAPER CHART

## 2013-08-13 ENCOUNTER — Other Ambulatory Visit: Payer: Self-pay | Admitting: Cardiovascular Disease

## 2013-08-13 NOTE — Telephone Encounter (Signed)
Rx was sent to pharmacy electronically. 

## 2013-08-20 ENCOUNTER — Encounter (HOSPITAL_COMMUNITY): Payer: Self-pay | Admitting: *Deleted

## 2013-08-20 ENCOUNTER — Inpatient Hospital Stay (HOSPITAL_COMMUNITY): Payer: Medicare Other

## 2013-08-20 ENCOUNTER — Inpatient Hospital Stay (HOSPITAL_COMMUNITY)
Admission: AD | Admit: 2013-08-20 | Discharge: 2013-08-23 | DRG: 244 | Disposition: A | Discharge status: Home / Self Care | Payer: Medicare Other | Source: Ambulatory Visit | Attending: Internal Medicine | Admitting: Internal Medicine

## 2013-08-20 DIAGNOSIS — M810 Age-related osteoporosis without current pathological fracture: Secondary | ICD-10-CM | POA: Diagnosis not present

## 2013-08-20 DIAGNOSIS — R9389 Abnormal findings on diagnostic imaging of other specified body structures: Secondary | ICD-10-CM | POA: Diagnosis not present

## 2013-08-20 DIAGNOSIS — T59891A Toxic effect of other specified gases, fumes and vapors, accidental (unintentional), initial encounter: Secondary | ICD-10-CM | POA: Diagnosis present

## 2013-08-20 DIAGNOSIS — Z96649 Presence of unspecified artificial hip joint: Secondary | ICD-10-CM

## 2013-08-20 DIAGNOSIS — R609 Edema, unspecified: Secondary | ICD-10-CM | POA: Diagnosis not present

## 2013-08-20 DIAGNOSIS — I509 Heart failure, unspecified: Secondary | ICD-10-CM | POA: Diagnosis present

## 2013-08-20 DIAGNOSIS — E785 Hyperlipidemia, unspecified: Secondary | ICD-10-CM | POA: Diagnosis not present

## 2013-08-20 DIAGNOSIS — I080 Rheumatic disorders of both mitral and aortic valves: Secondary | ICD-10-CM | POA: Diagnosis not present

## 2013-08-20 DIAGNOSIS — I359 Nonrheumatic aortic valve disorder, unspecified: Secondary | ICD-10-CM | POA: Diagnosis not present

## 2013-08-20 DIAGNOSIS — I129 Hypertensive chronic kidney disease with stage 1 through stage 4 chronic kidney disease, or unspecified chronic kidney disease: Secondary | ICD-10-CM | POA: Diagnosis present

## 2013-08-20 DIAGNOSIS — Z6825 Body mass index (BMI) 25.0-25.9, adult: Secondary | ICD-10-CM | POA: Diagnosis not present

## 2013-08-20 DIAGNOSIS — T59811A Toxic effect of smoke, accidental (unintentional), initial encounter: Secondary | ICD-10-CM | POA: Diagnosis present

## 2013-08-20 DIAGNOSIS — Z7982 Long term (current) use of aspirin: Secondary | ICD-10-CM

## 2013-08-20 DIAGNOSIS — I442 Atrioventricular block, complete: Secondary | ICD-10-CM | POA: Diagnosis not present

## 2013-08-20 DIAGNOSIS — Z8546 Personal history of malignant neoplasm of prostate: Secondary | ICD-10-CM | POA: Diagnosis not present

## 2013-08-20 DIAGNOSIS — I498 Other specified cardiac arrhythmias: Secondary | ICD-10-CM | POA: Diagnosis present

## 2013-08-20 DIAGNOSIS — N182 Chronic kidney disease, stage 2 (mild): Secondary | ICD-10-CM | POA: Diagnosis present

## 2013-08-20 DIAGNOSIS — Z95810 Presence of automatic (implantable) cardiac defibrillator: Secondary | ICD-10-CM | POA: Diagnosis not present

## 2013-08-20 DIAGNOSIS — J90 Pleural effusion, not elsewhere classified: Secondary | ICD-10-CM | POA: Diagnosis not present

## 2013-08-20 DIAGNOSIS — I4892 Unspecified atrial flutter: Secondary | ICD-10-CM | POA: Diagnosis not present

## 2013-08-20 DIAGNOSIS — I059 Rheumatic mitral valve disease, unspecified: Secondary | ICD-10-CM | POA: Diagnosis not present

## 2013-08-20 DIAGNOSIS — Z79899 Other long term (current) drug therapy: Secondary | ICD-10-CM

## 2013-08-20 DIAGNOSIS — I4891 Unspecified atrial fibrillation: Principal | ICD-10-CM | POA: Diagnosis present

## 2013-08-20 DIAGNOSIS — I10 Essential (primary) hypertension: Secondary | ICD-10-CM | POA: Diagnosis not present

## 2013-08-20 DIAGNOSIS — N400 Enlarged prostate without lower urinary tract symptoms: Secondary | ICD-10-CM | POA: Diagnosis present

## 2013-08-20 HISTORY — DX: Atrioventricular block, complete: I44.2

## 2013-08-20 HISTORY — DX: Benign prostatic hyperplasia without lower urinary tract symptoms: N40.0

## 2013-08-20 LAB — CBC WITH DIFFERENTIAL/PLATELET
BASOS PCT: 0 % (ref 0–1)
Basophils Absolute: 0 10*3/uL (ref 0.0–0.1)
Eosinophils Absolute: 0.2 10*3/uL (ref 0.0–0.7)
Eosinophils Relative: 4 % (ref 0–5)
HEMATOCRIT: 34.1 % — AB (ref 39.0–52.0)
Hemoglobin: 11.6 g/dL — ABNORMAL LOW (ref 13.0–17.0)
LYMPHS PCT: 20 % (ref 12–46)
Lymphs Abs: 1 10*3/uL (ref 0.7–4.0)
MCH: 31 pg (ref 26.0–34.0)
MCHC: 34 g/dL (ref 30.0–36.0)
MCV: 91.2 fL (ref 78.0–100.0)
MONO ABS: 0.5 10*3/uL (ref 0.1–1.0)
Monocytes Relative: 9 % (ref 3–12)
NEUTROS ABS: 3.4 10*3/uL (ref 1.7–7.7)
NEUTROS PCT: 67 % (ref 43–77)
Platelets: 151 10*3/uL (ref 150–400)
RBC: 3.74 MIL/uL — ABNORMAL LOW (ref 4.22–5.81)
RDW: 14.7 % (ref 11.5–15.5)
WBC: 5.1 10*3/uL (ref 4.0–10.5)

## 2013-08-20 LAB — COMPREHENSIVE METABOLIC PANEL
ALBUMIN: 3.1 g/dL — AB (ref 3.5–5.2)
ALT: 40 U/L (ref 0–53)
AST: 36 U/L (ref 0–37)
Alkaline Phosphatase: 86 U/L (ref 39–117)
BUN: 45 mg/dL — AB (ref 6–23)
CO2: 23 mEq/L (ref 19–32)
CREATININE: 1.36 mg/dL — AB (ref 0.50–1.35)
Calcium: 9.1 mg/dL (ref 8.4–10.5)
Chloride: 109 mEq/L (ref 96–112)
GFR calc non Af Amer: 46 mL/min — ABNORMAL LOW (ref >90)
GFR, EST AFRICAN AMERICAN: 53 mL/min — AB (ref >90)
GLUCOSE: 147 mg/dL — AB (ref 70–99)
Potassium: 4.2 mEq/L (ref 3.7–5.3)
Sodium: 145 mEq/L (ref 137–147)
TOTAL PROTEIN: 5.3 g/dL — AB (ref 6.0–8.3)
Total Bilirubin: 0.7 mg/dL (ref 0.3–1.2)

## 2013-08-20 LAB — MAGNESIUM: MAGNESIUM: 2.2 mg/dL (ref 1.5–2.5)

## 2013-08-20 MED ORDER — HYDRALAZINE HCL 10 MG PO TABS
10.0000 mg | ORAL_TABLET | Freq: Three times a day (TID) | ORAL | Status: DC | PRN
  Filled 2013-08-20: qty 1

## 2013-08-20 MED ORDER — FUROSEMIDE 10 MG/ML IJ SOLN
40.0000 mg | Freq: Once | INTRAMUSCULAR | Status: AC
  Administered 2013-08-20: 40 mg via INTRAVENOUS
  Filled 2013-08-20: qty 4

## 2013-08-20 MED ORDER — SODIUM CHLORIDE 0.9 % IJ SOLN: 3.0000 mL | INTRAMUSCULAR | Status: DC | PRN

## 2013-08-20 MED ORDER — ASPIRIN EC 81 MG PO TBEC
81.0000 mg | DELAYED_RELEASE_TABLET | Freq: Every day | ORAL | Status: DC
  Administered 2013-08-20 – 2013-08-23 (×4): 81 mg via ORAL
  Filled 2013-08-20 (×4): qty 1

## 2013-08-20 MED ORDER — SODIUM CHLORIDE 0.9 % IV SOLN
250.0000 mL | INTRAVENOUS | Status: DC | PRN
Start: 2013-08-20 — End: 2013-08-21

## 2013-08-20 MED ORDER — ACETAMINOPHEN 650 MG RE SUPP: 650.0000 mg | Freq: Four times a day (QID) | RECTAL | Status: DC | PRN

## 2013-08-20 MED ORDER — ENOXAPARIN SODIUM 30 MG/0.3ML ~~LOC~~ SOLN
30.0000 mg | SUBCUTANEOUS | Status: DC
  Administered 2013-08-20: 30 mg via SUBCUTANEOUS
  Filled 2013-08-20 (×2): qty 0.3

## 2013-08-20 MED ORDER — SODIUM CHLORIDE 0.9 % IJ SOLN
3.0000 mL | Freq: Two times a day (BID) | INTRAMUSCULAR | Status: DC
  Administered 2013-08-20 – 2013-08-21 (×2): 3 mL via INTRAVENOUS

## 2013-08-20 MED ORDER — ACETAMINOPHEN 325 MG PO TABS: 650.0000 mg | ORAL_TABLET | Freq: Four times a day (QID) | ORAL | Status: DC | PRN

## 2013-08-20 NOTE — Progress Notes (Signed)
Cardiology at bedside with pt, notified MD of BP 202/62 manually.  New orders placed for hydralazine, will carry out MD orders and continue to monitor.

## 2013-08-20 NOTE — Progress Notes (Addendum)
Pt admitted to room 3e30, pt alert and oriented and ambulatory.  BLE +3 pitting edema, BP 202/62 manually and HR sustaining in the 30's.  Pt asymptomatic.  Rapid response called and MD notified.  Will continue to monitor.

## 2013-08-20 NOTE — Consult Note (Signed)
CARDIOLOGY CONSULT NOTE   Patient ID: Justin Orozco MRN: [id number], DOB/AGE: 78-Apr-1928   Admit date: 08/20/2013 Date of Consult: 08/20/2013   Primary Physician: Geoffery Lyons, MD Primary Cardiologist: Dr. [name]  Pt. Profile  80 year old gentleman admitted with marked bradycardia  Problem List  No past medical history on file.  No past surgical history on file.   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 Reynaldo Minium because of worsening peripheral edema for the past 5 or 6 days.  Today he went to see Dr. Reynaldo Minium in the office and was noted to have marked bradycardia with a heart rate in the 30s.  He has a past history of paroxysmal atrial flutter and has been on metoprolol 25 mg twice a day with last dose taken this morning.  He has not been experiencing any chest pain.  He has not had syncope.  He has had some shortness of breath with exertion and has had decreased stamina recently. Inpatient Medications  . aspirin EC  81 mg Oral Daily  . enoxaparin (LOVENOX) injection  30 mg Subcutaneous Q24H  . furosemide  40 mg Intravenous Once  . sodium chloride  3 mL Intravenous Q12H    Family History No family history on file.   Social History History   Social History  . Marital Status: Widowed    Spouse Name: N/A    Number of Children: N/A  . Years of Education: N/A   Occupational History  . Not on file.   Social History Main Topics  . Smoking status: Not on file  . Smokeless tobacco: Not on file  . Alcohol Use: Not on file  . Drug Use: Not on file  . Sexual Activity: Not on file   Other Topics Concern  . Not on file   Social History Narrative  . No narrative on file     Review of Systems  General:  No chills, fever, night sweats or weight changes.  Cardiovascular:  No chest pain, dyspnea on exertion,  orthopnea, palpitations, paroxysmal nocturnal dyspnea.  Positive for peripheral  edema Dermatological: No rash, lesions/masses Respiratory: No cough, dyspnea Urologic: No hematuria, dysuria Abdominal:   No nausea, vomiting, diarrhea, bright red blood per rectum, melena, or hematemesis Neurologic:  No visual changes, wkns, changes in mental status. All other systems reviewed and are otherwise negative except as noted above.  Physical Exam  Blood pressure 202/62, pulse 34, temperature 97.3 F (36.3 C), temperature source Oral, resp. rate 18, height 5\' 9"  (1.753 m), weight 153 lb (69.4 kg), SpO2 100.00%.  General: Pleasant, NAD Psych: Normal affect. Neuro: Alert and oriented X 3. Moves all extremities spontaneously. HEENT: Normal  Neck: Supple without bruits or JVD.  He has intermittent cannon A waves in his neck. Lungs:  Resp regular and unlabored, CTA. Heart: RRR no s3, s4, or murmurs. Abdomen: Soft, non-tender, non-distended, BS + x 4.  Extremities: No clubbing, DP/PT/Radials 2+ and equal bilaterally.  There is significant bilateral pitting edema up to above the knees. Labs  No results found for this basename: CKTOTAL, CKMB, TROPONINI,  in the last 72 hours Lab Results  Component Value Date   WBC 7.2 09/09/2010   HGB 9.6* 09/09/2010   HCT 28.5* 09/09/2010   MCV 89.3 09/09/2010   PLT 178 09/09/2010   No results found for this basename: NA, K, CL, CO2, BUN, CREATININE, CALCIUM, LABALBU, PROT, BILITOT, ALKPHOS, ALT, AST, GLUCOSE,  in the last 168 hours No results found for this basename: CHOL,  HDL,  LDLCALC,  TRIG   No results found for this basename: DDIMER    Radiology/Studies  No results found.  ECG EKG shows normal sinus rhythm with complete heart block  ASSESSMENT AND PLAN 1.  Complete heart block 2.  past history of paroxysmal atrial flutter 3.  recent onset of peripheral edema  Plan: The patient is tolerating his rhythm well.  Lopressor has been stopped we will observe whether his AV conduction improves off of beta blocker.  If not, he will need a  pacemaker.  We will ask EP to round on him tomorrow. He has significant systolic hypertension with blood pressure above 200 tonight.  Some of this reflects his large stroke volume secondary to a heart rate of 30.  He has been given one dose of Lasix for his edema and this should also help his blood pressure.  We will also add low-dose hydralazine.   Signed, Darlin Coco, MD  08/20/2013, 7:29 PM

## 2013-08-20 NOTE — H&P (Signed)
PCP:   No primary provider on file.   Chief Complaint:  swelling  HPI: Justin Orozco is a gentleman well-known to myself with a history of paroxysmal atrial flutter followed by Dr. Alvester Chou and prostate cancer presenting at this time with increasing swelling for about 5-6 days.  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  He is widowed but does have a friend and every supportive family.  He is fully independent in his ADLs presents this time with several days of lower extremity swelling.  He's had no chest pain or shortness of breath.  He's had no syncope or near syncope.  He's had no fever or chills.  He's had no change in bowel or urinary habits.  In the office he was found to have a heart rate in the 30s with fairly significant lower extremity swelling and questionable pleural effusions.  Office EKG demonstrated either atrial fibrillation with very slow ventricular response or a high grade AV block former is favored.  I did discuss this with cardiology and admitted overnight for observation, holding the beta blocker and consideration for pacemaker.  Past Medical History: No past medical history on file. No past surgical history on file.  Medications: Prior to Admission medications   Medication Sig Start Date End Date Taking? Authorizing Provider  metoprolol (LOPRESSOR) 50 MG tablet TAKE 1/2 TABLET BY MOUTH TWICE DAILY. PATIENT NEEDS OFFICE VISIT 08/13/13   Lorretta Harp, MD    Allergies:  Allergies not on file  Social History:  has no tobacco, alcohol, and drug history on file.  Family History: No family history on file.  Physical Exam: There were no vitals filed for this visit. General appearance: alert, cooperative and no distress Head: Normocephalic, without obvious abnormality, atraumatic Eyes: conjunctivae/corneas clear. PERRL, EOM's intact.  Nose: Nares normal. Septum midline. Mucosa normal. No drainage or sinus tenderness. Throat: lips, mucosa, and tongue normal;  teeth and gums normal Neck: no adenopathy, no carotid bruit, no JVD and thyroid not enlarged, symmetric, no tenderness/mass/nodules Resp: clear to auscultation bilaterally Cardio: regular rate and rhythm, distant heart sounds no obvious murmur GI: soft, non-tender; bowel sounds normal; no masses,  no organomegaly Extremities: extremities normal, atraumatic, no cyanosis, 2-3+ pitting edema from the foot up to the mid thigh bilateral Pulses: 2+ and symmetric Lymph nodes: Cervical adenopathy: no cervical lymphadenopathy Neurologic: Alert and oriented X 3, normal strength and tone. Normal symmetric reflexes.     Labs on Admission:  No results found for this basename: NA, K, CL, CO2, GLUCOSE, BUN, CREATININE, CALCIUM, MG, PHOS,  in the last 72 hours No results found for this basename: AST, ALT, ALKPHOS, BILITOT, PROT, ALBUMIN,  in the last 72 hours No results found for this basename: LIPASE, AMYLASE,  in the last 72 hours No results found for this basename: WBC, NEUTROABS, HGB, HCT, MCV, PLT,  in the last 72 hours No results found for this basename: CKTOTAL, CKMB, CKMBINDEX, TROPONINI,  in the last 72 hours No results found for this basename: TSH, T4TOTAL, FREET3, T3FREE, THYROIDAB,  in the last 72 hours No results found for this basename: VITAMINB12, FOLATE, FERRITIN, TIBC, IRON, RETICCTPCT,  in the last 72 hours  Radiological Exams on Admission: No results found. Orders placed during the hospital encounter of 09/05/10  . EKG  . EKG  . EKG  . EKG    Assessment/Plan Active Problems:   CHF (congestive heart failure)    Bradycardia believe atrial fibrillation with slow ventricular response  Admit for diuresis, cardiac workup and consideration for cardiac pacemaker  Doctor Sheahan A 08/20/2013, 5:38 PM

## 2013-08-21 ENCOUNTER — Encounter (HOSPITAL_COMMUNITY): Payer: Self-pay | Admitting: *Deleted

## 2013-08-21 ENCOUNTER — Encounter (HOSPITAL_COMMUNITY)
Admission: AD | Disposition: A | Discharge status: Home / Self Care | Payer: Self-pay | Source: Ambulatory Visit | Attending: Internal Medicine

## 2013-08-21 DIAGNOSIS — I359 Nonrheumatic aortic valve disorder, unspecified: Secondary | ICD-10-CM

## 2013-08-21 DIAGNOSIS — I442 Atrioventricular block, complete: Secondary | ICD-10-CM

## 2013-08-21 DIAGNOSIS — I4892 Unspecified atrial flutter: Secondary | ICD-10-CM

## 2013-08-21 HISTORY — PX: PERMANENT PACEMAKER INSERTION: SHX5480

## 2013-08-21 LAB — BASIC METABOLIC PANEL
BUN: 41 mg/dL — ABNORMAL HIGH (ref 6–23)
CHLORIDE: 109 meq/L (ref 96–112)
CO2: 23 meq/L (ref 19–32)
CREATININE: 1.43 mg/dL — AB (ref 0.50–1.35)
Calcium: 9.3 mg/dL (ref 8.4–10.5)
GFR calc non Af Amer: 43 mL/min — ABNORMAL LOW (ref >90)
GFR, EST AFRICAN AMERICAN: 50 mL/min — AB (ref >90)
Glucose, Bld: 95 mg/dL (ref 70–99)
POTASSIUM: 3.9 meq/L (ref 3.7–5.3)
Sodium: 146 mEq/L (ref 137–147)

## 2013-08-21 LAB — T4, FREE: Free T4: 1.09 ng/dL (ref 0.80–1.80)

## 2013-08-21 LAB — TSH: TSH: 3.81 u[IU]/mL (ref 0.350–4.500)

## 2013-08-21 LAB — PRO B NATRIURETIC PEPTIDE: PRO B NATRI PEPTIDE: 6238 pg/mL — AB (ref 0–450)

## 2013-08-21 SURGERY — PERMANENT PACEMAKER INSERTION
Anesthesia: LOCAL

## 2013-08-21 MED ORDER — SODIUM CHLORIDE 0.9 % IJ SOLN
3.0000 mL | Freq: Two times a day (BID) | INTRAMUSCULAR | Status: DC
  Administered 2013-08-21 – 2013-08-23 (×4): 3 mL via INTRAVENOUS

## 2013-08-21 MED ORDER — HYDROCODONE-ACETAMINOPHEN 5-325 MG PO TABS: 1.0000 | ORAL_TABLET | ORAL | Status: DC | PRN

## 2013-08-21 MED ORDER — LIDOCAINE HCL (PF) 1 % IJ SOLN
INTRAMUSCULAR | Status: AC
Start: 2013-08-21 — End: 2013-08-21
  Filled 2013-08-21: qty 60

## 2013-08-21 MED ORDER — ACETAMINOPHEN 325 MG PO TABS: 325.0000 mg | ORAL_TABLET | ORAL | Status: DC | PRN

## 2013-08-21 MED ORDER — CEFAZOLIN SODIUM-DEXTROSE 2-3 GM-% IV SOLR
2.0000 g | INTRAVENOUS | Status: DC
  Filled 2013-08-21: qty 50

## 2013-08-21 MED ORDER — CEFAZOLIN SODIUM 1-5 GM-% IV SOLN
1.0000 g | Freq: Four times a day (QID) | INTRAVENOUS | Status: AC
  Administered 2013-08-21 – 2013-08-22 (×3): 1 g via INTRAVENOUS
  Filled 2013-08-21 (×3): qty 50

## 2013-08-21 MED ORDER — ONDANSETRON HCL 4 MG/2ML IJ SOLN: 4.0000 mg | Freq: Four times a day (QID) | INTRAMUSCULAR | Status: DC | PRN

## 2013-08-21 MED ORDER — SODIUM CHLORIDE 0.9 % IV SOLN: 250.0000 mL | INTRAVENOUS | Status: DC | PRN

## 2013-08-21 MED ORDER — CHLORHEXIDINE GLUCONATE 4 % EX LIQD
60.0000 mL | Freq: Once | CUTANEOUS | Status: DC
  Filled 2013-08-21: qty 60

## 2013-08-21 MED ORDER — SODIUM CHLORIDE 0.9 % IJ SOLN: 3.0000 mL | INTRAMUSCULAR | Status: DC | PRN

## 2013-08-21 MED ORDER — SODIUM CHLORIDE 0.9 % IV SOLN
INTRAVENOUS | Status: DC
  Administered 2013-08-21: 11:00:00 via INTRAVENOUS

## 2013-08-21 MED ORDER — LIDOCAINE HCL (PF) 1 % IJ SOLN
INTRAMUSCULAR | Status: AC
Start: 2013-08-21 — End: 2013-08-21
  Filled 2013-08-21: qty 30

## 2013-08-21 MED ORDER — SODIUM CHLORIDE 0.9 % IR SOLN
80.0000 mg | Status: DC
  Filled 2013-08-21: qty 2

## 2013-08-21 NOTE — Progress Notes (Signed)
Orthopedic Tech Progress Note Patient Details:  Justin Orozco [birth year] [id number]  Ortho Devices Type of Ortho Device: Arm sling Ortho Device/Splint Interventions: Application   [name] 08/21/2013, 2:43 PM

## 2013-08-21 NOTE — Consult Note (Signed)
ELECTROPHYSIOLOGY CONSULT NOTE    Patient ID: Justin Orozco MRN: [id number], DOB/AGE: [birth year] 80 y.o.  Admit date: 08/20/2013 Date of Consult: 08-21-2013  Primary Physician: Geoffery Lyons, MD Primary Cardiologist: [name]  Reason for Consultation: heart block  HPI:  Justin Orozco is a 80 y.o. male with a past medical history of atrial flutter and BPH.  He was evaluated at his PCP's office yesterday for a 1 week history of lower extremity edema. There he was found to be in complete heart block with a ventricular rate in the 30's and was [facility].  He was evaluated by Dr [name] in 2012 for palpitations and was found to have atrial arrhythmias.  He was placed on Metoprolol at that time with resolution of his symptoms.  Last echo 2012 demonstrated normal EF.  Repeat echo pending this morning.   He denies recent chest pain, shortness of breath, fatigue, palpitations, dizziness or syncope.  He is on Metoprolol 25mg  twice daily at home with last dose 3-12 at 7:30AM.    EP has been asked to evaluate for treatment options.  ROS is negative except as outlined above.   Past Medical History  Diagnosis Date  . Atrial flutter   . BPH (benign prostatic hyperplasia)   . Complete heart block      Surgical History:  Past Surgical History  Procedure Laterality Date  . Tonsillectomy      "when i was 26 or 80 years old"  . Total hip arthroplasty  08/2010    right hip - Dr French Ana     Prescriptions prior to admission  Medication Sig Dispense Refill  . aspirin EC 81 MG tablet Take 81 mg by mouth daily.      . metoprolol (LOPRESSOR) 50 MG tablet Take 25 mg by mouth 2 (two) times daily.      . Multiple Vitamins-Minerals (MULTIVITAMIN PO) Take 1 tablet by mouth daily.      . tamsulosin (FLOMAX) 0.4 MG CAPS capsule Take 0.4-0.8 mg by mouth at bedtime. *MD said he can take a 2nd capsule if he needs it*        Inpatient Medications:  . aspirin EC  81 mg Oral  Daily  .  ceFAZolin (ANCEF) IV  2 g Intravenous On Call  . chlorhexidine  60 mL Topical Once  . chlorhexidine  60 mL Topical Once  . gentamicin irrigation  80 mg Irrigation On Call  . sodium chloride  3 mL Intravenous Q12H    Allergies: No Known Allergies  History   Social History  . Marital Status: Widowed    Spouse Name: N/A    Number of Children: N/A  . Years of Education: N/A   Occupational History  . Not on file.   Social History Main Topics  . Smoking status: Passive Smoke Exposure - Never Smoker  . Smokeless tobacco: Not on file  . Alcohol Use: 2.3 oz/week    3 Glasses of wine, 1 Drinks containing 0.5 oz of alcohol per week  . Drug Use: No  . Sexual Activity: No   Other Topics Concern  . Not on file   Social History Narrative  . No narrative on file     History reviewed. No pertinent family history.    Physical Exam: Filed Vitals:   08/20/13 2025 08/21/13 0117 08/21/13 0500 08/21/13 0701  BP: 151/49 180/55  194/44  Pulse: 55 30  28  Temp: 98 F (36.7 C)  97.4 F (36.3 C)  TempSrc: Oral   Oral  Resp: 18 20  20  Height:      Weight:   149 lb 14.4 oz (67.994 kg)   SpO2: 97% 99%  97%    GEN- The patient is elderly and ill appearing, alert and oriented x 3 today.   Head- normocephalic, atraumatic Eyes-  Sclera clear, conjunctiva pink Ears- hearing intact Oropharynx- clear Neck- supple  Lungs- Clear to ausculation bilaterally, normal work of breathing Heart- bradycardic regular rhythm GI- soft, NT, ND, + BS Extremities- no clubbing, cyanosis, or edema MS- diffuse muscle atrophy Skin- no rash or lesion Psych- euthymic mood, full affect Neuro- strength and sensation are intact  Labs:   Lab Results  Component Value Date   WBC 5.1 08/20/2013   HGB 11.6* 08/20/2013   HCT 34.1* 08/20/2013   MCV 91.2 08/20/2013   PLT 151 08/20/2013     Recent Labs Lab 08/20/13 2058 08/21/13 0554  NA 145 146  K 4.2 3.9  CL 109 109  CO2 23 23  BUN 45* 41*    CREATININE 1.36* 1.43*  CALCIUM 9.1 9.3  PROT 5.3*  --   BILITOT 0.7  --   ALKPHOS 86  --   ALT 40  --   AST 36  --   GLUCOSE 147* 95    Radiology/Studies: Dg Chest 2 View 08/21/2013   CLINICAL DATA:  CHF, former smoker  EXAM: CHEST  2 VIEW  COMPARISON:  DG CHEST 1V PORT dated 09/05/2010  FINDINGS: Grossly unchanged borderline enlarged cardiac silhouette and mediastinal contours with atherosclerotic plaque within the aortic arch. Interval development of small bilateral effusions and associated bibasilar heterogeneous/consolidative opacities, left greater than right. The lungs remain hyperexpanded. Mild cephalization of flow. No pneumothorax. No acute osseus abnormalities.  IMPRESSION: Pulmonary venous congestion superimposed on advanced left cm change with small bilateral effusions and associated bibasilar opacities, left greater than right, atelectasis versus infiltrate.   Electronically Signed   By: John  Watts M.D.   On: 08/21/2013 07:58    EKG:complete heart block, ventricular rate 31, RBBB, LAFB, QRS 140  TELEMETRY: complete heart block, ventricular rates in the 30's  Assessment and Plan:  1. Complete heart block The patient has chronic degenerative conduction disease which has now progressed to complete heart block.  This persists despite adequate washout of metoprolol.  No reversible causes have been found. I would therefore recommend pacemaker implantation at this time.  Risks, benefits, alternatives to pacemaker implantation were discussed in detail with the patient today. The patient understands that the risks include but are not limited to bleeding, infection, pneumothorax, perforation, tamponade, vascular damage, renal failure, MI, stroke, death,  and lead dislodgement and wishes to proceed. We will therefore schedule the procedure at the next available time.  2. Atrial flutter No clinical recurrences Will monitor through his pacemaker   

## 2013-08-21 NOTE — Progress Notes (Signed)
Echocardiogram 2D Echocardiogram has been performed.  Justin Orozco 08/21/2013, 10:30 AM

## 2013-08-21 NOTE — Interval H&P Note (Signed)
History and Physical Interval Note:  08/21/2013 9:07 AM  Justin Orozco  has presented today for surgery, with the diagnosis of snycope  The various methods of treatment have been discussed with the patient and family. After consideration of risks, benefits and other options for treatment, the patient has consented to  Procedure(s): PERMANENT PACEMAKER INSERTION (N/A) as a surgical intervention .  The patient's history has been reviewed, patient examined, no change in status, stable for surgery.  I have reviewed the patient's chart and labs.  Questions were answered to the patient's satisfaction.     [name]

## 2013-08-21 NOTE — Op Note (Signed)
SURGEON:  [name], MD     PREPROCEDURE DIAGNOSIS:  Symptomatic complete heart block    POSTPROCEDURE DIAGNOSIS:  Symptomatic complete heart block     PROCEDURES:   1. Left upper extremity venography.   2. Pacemaker implantation.     INTRODUCTION:  Justin Orozco is a 80 y.o. male with a history of complete heart block who presents today for pacemaker implantation.  The patient reports progressive symptoms of dizziness and fatigue.  He presents in complete heart block. His AV block persists despite adequate holding of beta blocker therapy. No reversible causes have been identified.  The patient therefore presents today for pacemaker implantation.     DESCRIPTION OF PROCEDURE:  Informed written consent was obtained, and  the patient was brought to the electrophysiology lab in a fasting state.  The patient required no sedation for the procedure today.  The patients left chest was prepped and draped in the usual sterile fashion by the EP lab staff. The skin overlying the left deltopectoral region was infiltrated with lidocaine for local analgesia.  A 4-cm incision was made over the left deltopectoral region.  A left subcutaneous pacemaker pocket was fashioned using a combination of sharp and blunt dissection. Electrocautery was required to assure hemostasis.    Left Upper Extremity Venography: A venogram of the left upper extremity was performed, which revealed a small left cephalic vein, which emptied into a large left subclavian vein.  The left axillary vein was moderate in size.    RA/RV Lead Placement: The left axillary vein was therefore cannulated.  Through the left axillary vein, a [employer] model [id number] (serial number  [id number]) right atrial lead and a [facility] model [phone] (serial number  [id number]) right ventricular lead were advanced with fluoroscopic visualization into the right atrial appendage and right ventricular apex positions respectively.  Initial atrial lead P-  waves measured 7 mV with impedance of 447 ohms and a threshold of 0.5 V at 0.5 msec.  Right ventricular lead R-waves measured 3 mV with an impedance of 678 ohms and a threshold of 0.5 V at 0.5 msec.  Both leads were secured to the pectoralis fascia using #2-0 silk over the suture sleeves.   Device Placement:  The leads were then connected to a [employer] DR  model [id number] (serial number  [id number] ) pacemaker.  The pocket was irrigated with copious gentamicin solution.  The pacemaker was then placed into the pocket.  The pocket was then closed in 2 layers with 2.0 Vicryl suture for the subcutaneous and subcuticular layers.  Steri-  Strips and a sterile dressing were then applied.  There were no early apparent complications.     CONCLUSIONS:   1. Successful implantation of a [employer] DR  dual-chamber pacemaker for symptomatic complete heart block  2. No early apparent complications.           [name], MD 08/21/2013 1:49 PM

## 2013-08-21 NOTE — H&P (View-Only) (Signed)
ELECTROPHYSIOLOGY CONSULT NOTE    Patient ID: Justin Orozco MRN: [id number], DOB/AGE: [birth year] 80 y.o.  Admit date: 08/20/2013 Date of Consult: 08-21-2013  Primary Physician: Geoffery Lyons, MD Primary Cardiologist: [name]  Reason for Consultation: heart block  HPI:  ANOTHONY Orozco is a 80 y.o. male with a past medical history of atrial flutter and BPH.  He was evaluated at his PCP's office yesterday for a 1 week history of lower extremity edema. There he was found to be in complete heart block with a ventricular rate in the 30's and [facility].  He was evaluated by Dr [name] in 2012 for palpitations and was found to have atrial arrhythmias.  He was placed on Metoprolol at that time with resolution of his symptoms.  Last echo 2012 demonstrated normal EF.  Repeat echo pending this morning.   He denies recent chest pain, shortness of breath, fatigue, palpitations, dizziness or syncope.  He is on Metoprolol 25mg  twice daily at home with last dose 3-12 at 7:30AM.    EP has been asked to evaluate for treatment options.  ROS is negative except as outlined above.   Past Medical History  Diagnosis Date  . Atrial flutter   . BPH (benign prostatic hyperplasia)   . Complete heart block      Surgical History:  Past Surgical History  Procedure Laterality Date  . Tonsillectomy      "when i was 26 or 80 years old"  . Total hip arthroplasty  08/2010    right hip - Dr French Ana     Prescriptions prior to admission  Medication Sig Dispense Refill  . aspirin EC 81 MG tablet Take 81 mg by mouth daily.      . metoprolol (LOPRESSOR) 50 MG tablet Take 25 mg by mouth 2 (two) times daily.      . Multiple Vitamins-Minerals (MULTIVITAMIN PO) Take 1 tablet by mouth daily.      . tamsulosin (FLOMAX) 0.4 MG CAPS capsule Take 0.4-0.8 mg by mouth at bedtime. *MD said he can take a 2nd capsule if he needs it*        Inpatient Medications:  . aspirin EC  81 mg Oral  Daily  .  ceFAZolin (ANCEF) IV  2 g Intravenous On Call  . chlorhexidine  60 mL Topical Once  . chlorhexidine  60 mL Topical Once  . gentamicin irrigation  80 mg Irrigation On Call  . sodium chloride  3 mL Intravenous Q12H    Allergies: No Known Allergies  History   Social History  . Marital Status: Widowed    Spouse Name: N/A    Number of Children: N/A  . Years of Education: N/A   Occupational History  . Not on file.   Social History Main Topics  . Smoking status: Passive Smoke Exposure - Never Smoker  . Smokeless tobacco: Not on file  . Alcohol Use: 2.3 oz/week    3 Glasses of wine, 1 Drinks containing 0.5 oz of alcohol per week  . Drug Use: No  . Sexual Activity: No   Other Topics Concern  . Not on file   Social History Narrative  . No narrative on file     History reviewed. No pertinent family history.    Physical Exam: Filed Vitals:   08/20/13 2025 08/21/13 0117 08/21/13 0500 08/21/13 0701  BP: 151/49 180/55  194/44  Pulse: 55 30  28  Temp: 98 F (36.7 C)  97.4 F (36.3 C)  TempSrc: Oral   Oral  Resp: 18 20  20   Height:      Weight:   149 lb 14.4 oz (67.994 kg)   SpO2: 97% 99%  97%    GEN- The patient is elderly and ill appearing, alert and oriented x 3 today.   Head- normocephalic, atraumatic Eyes-  Sclera clear, conjunctiva pink Ears- hearing intact Oropharynx- clear Neck- supple  Lungs- Clear to ausculation bilaterally, normal work of breathing Heart- bradycardic regular rhythm GI- soft, NT, ND, + BS Extremities- no clubbing, cyanosis, or edema MS- diffuse muscle atrophy Skin- no rash or lesion Psych- euthymic mood, full affect Neuro- strength and sensation are intact  Labs:   Lab Results  Component Value Date   WBC 5.1 08/20/2013   HGB 11.6* 08/20/2013   HCT 34.1* 08/20/2013   MCV 91.2 08/20/2013   PLT 151 08/20/2013     Recent Labs Lab 08/20/13 2058 08/21/13 0554  NA 145 146  K 4.2 3.9  CL 109 109  CO2 23 23  BUN 45* 41*    CREATININE 1.36* 1.43*  CALCIUM 9.1 9.3  PROT 5.3*  --   BILITOT 0.7  --   ALKPHOS 86  --   ALT 40  --   AST 36  --   GLUCOSE 147* 95    Radiology/Studies: Dg Chest 2 View 08/21/2013   CLINICAL DATA:  CHF, former smoker  EXAM: CHEST  2 VIEW  COMPARISON:  DG CHEST 1V PORT dated 09/05/2010  FINDINGS: Grossly unchanged borderline enlarged cardiac silhouette and mediastinal contours with atherosclerotic plaque within the aortic arch. Interval development of small bilateral effusions and associated bibasilar heterogeneous/consolidative opacities, left greater than right. The lungs remain hyperexpanded. Mild cephalization of flow. No pneumothorax. No acute osseus abnormalities.  IMPRESSION: Pulmonary venous congestion superimposed on advanced left cm change with small bilateral effusions and associated bibasilar opacities, left greater than right, atelectasis versus infiltrate.   Electronically Signed   By: Sandi Mariscal M.D.   On: 08/21/2013 07:58    CXK:GYJEHUDJ heart block, ventricular rate 31, RBBB, LAFB, QRS 140  TELEMETRY: complete heart block, ventricular rates in the 30's  Assessment and Plan:  1. Complete heart block The patient has chronic degenerative conduction disease which has now progressed to complete heart block.  This persists despite adequate washout of metoprolol.  No reversible causes have been found. I would therefore recommend pacemaker implantation at this time.  Risks, benefits, alternatives to pacemaker implantation were discussed in detail with the patient today. The patient understands that the risks include but are not limited to bleeding, infection, pneumothorax, perforation, tamponade, vascular damage, renal failure, MI, stroke, death,  and lead dislodgement and wishes to proceed. We will therefore schedule the procedure at the next available time.  2. Atrial flutter No clinical recurrences Will monitor through his pacemaker

## 2013-08-21 NOTE — Care Management Note (Signed)
    Page 1 of 1   08/21/2013     4:35:14 PM   CARE MANAGEMENT NOTE 08/21/2013  Patient:  Justin Orozco, Justin Orozco   Account Number:  [id number]  Date Initiated:  08/21/2013  Documentation initiated by:  [facility]  Subjective/Objective Assessment:   80 y.o. male with a past medical history of atrial flutter and BPH. In PCP office, he was found to be in complete heart block with a ventricular rate in the 30s.//Home alone     Action/Plan:   Pacemaker insertion//Access for [facility] needs   Anticipated DC Date:  08/22/2013   Anticipated DC Plan:  [place]  CM consult      Choice offered to / List presented to:             Status of service:  In process, will continue to follow Medicare Important Message given?   (If response is "NO", the following Medicare IM given date fields will be blank) Date Medicare IM given:   Date Additional Medicare IM given:    Discharge Disposition:    Per UR Regulation:    If discussed at Long Length of Stay Meetings, dates discussed:    Comments:

## 2013-08-21 NOTE — Progress Notes (Signed)
Subjective: Well, echo in progrwess. dtr bedside. Pacemaker today.  Objective: Vital signs in last 24 hours: Temp:  [97.3 F (36.3 C)-98 F (36.7 C)] 97.4 F (36.3 C) (03/13 0701) Pulse Rate:  [28-55] 28 (03/13 0701) Resp:  [18-20] 20 (03/13 0701) BP: (151-202)/(44-62) 194/44 mmHg (03/13 0701) SpO2:  [97 %-100 %] 97 % (03/13 0701) Weight:  [67.994 kg (149 lb 14.4 oz)-69.4 kg (153 lb)] 67.994 kg (149 lb 14.4 oz) (03/13 0500) Weight change:   CBG (last 3)  No results found for this basename: GLUCAP,  in the last 72 hours  Intake/Output from previous day: 03/12 0701 - 03/13 0700 In: -  Out: 650 [Urine:650]  Physical Exam: Awake, alert, no distress No jvd, flat Chest decrease breath soubnds at bases, o/w clear HSm distant, slow Edema 2-3+ Neuro normal    Lab Results:  Recent Labs  08/20/13 2058 08/21/13 0554  NA 145 146  K 4.2 3.9  CL 109 109  CO2 23 23  GLUCOSE 147* 95  BUN 45* 41*  CREATININE 1.36* 1.43*  CALCIUM 9.1 9.3  MG 2.2  --     Recent Labs  08/20/13 2058  AST 36  ALT 40  ALKPHOS 86  BILITOT 0.7  PROT 5.3*  ALBUMIN 3.1*    Recent Labs  08/20/13 2058  WBC 5.1  NEUTROABS 3.4  HGB 11.6*  HCT 34.1*  MCV 91.2  PLT 151   Lab Results  Component Value Date   INR 1.55* 09/11/2010   INR 1.11 09/10/2010   INR 1.05 09/05/2010   No results found for this basename: CKTOTAL, CKMB, CKMBINDEX, TROPONINI,  in the last 72 hours  Recent Labs  08/20/13 2058  TSH 3.810   No results found for this basename: VITAMINB12, FOLATE, FERRITIN, TIBC, IRON, RETICCTPCT,  in the last 72 hours  Studies/Results: Dg Chest 2 View  08/21/2013   CLINICAL DATA:  CHF, former smoker  EXAM: CHEST  2 VIEW  COMPARISON:  DG CHEST 1V PORT dated 09/05/2010  FINDINGS: Grossly unchanged borderline enlarged cardiac silhouette and mediastinal contours with atherosclerotic plaque within the aortic arch. Interval development of small bilateral effusions and associated bibasilar  heterogeneous/consolidative opacities, left greater than right. The lungs remain hyperexpanded. Mild cephalization of flow. No pneumothorax. No acute osseus abnormalities.  IMPRESSION: Pulmonary venous congestion superimposed on advanced left cm change with small bilateral effusions and associated bibasilar opacities, left greater than right, atelectasis versus infiltrate.   Electronically Signed   By: Sandi Mariscal M.D.   On: 08/21/2013 07:58     Assessment/Plan: 1. CHB-pacemaker 2. CHF right and left sided, echo pending, suspect will improve with pacemaker and improved CO 3. HTN- consider meds add after pacemaker, bblocker held 4. CKD2-baseline  pacemaker   LOS: 1 day   Buzz Axel A 08/21/2013, 9:27 AM

## 2013-08-21 NOTE — Progress Notes (Signed)
Pt in complete heart block with heart rate of 27-56. Pt also hypertensive. Previous EKG from 7pm had shown marked SB (HR 30)with R BBB and LAFB. MD aware and was on floor to assess patient. Patient has been asymptomatic throughout shift. Orders received for PRN Hydralazine. Will continue to closely monitor.

## 2013-08-21 NOTE — Progress Notes (Signed)
Utilization Review Completed Areana Kosanke J. Zalika Tieszen, RN, BSN, NCM [phone]  

## 2013-08-21 NOTE — Progress Notes (Signed)
HR still 27-30 with hypertension.  No symptoms from pt.  EP to see today.

## 2013-08-21 NOTE — Progress Notes (Signed)
AM EKG performed. Pt continues with complete heart block with HR around 30 bpm. MD made aware. No new orders at this time. MDs plan for an EP consult. Will continue to monitor.

## 2013-08-22 ENCOUNTER — Inpatient Hospital Stay (HOSPITAL_COMMUNITY): Payer: Medicare Other

## 2013-08-22 LAB — BASIC METABOLIC PANEL
BUN: 32 mg/dL — AB (ref 6–23)
CALCIUM: 8.6 mg/dL (ref 8.4–10.5)
CO2: 24 mEq/L (ref 19–32)
Chloride: 108 mEq/L (ref 96–112)
Creatinine, Ser: 1.28 mg/dL (ref 0.50–1.35)
GFR, EST AFRICAN AMERICAN: 58 mL/min — AB (ref >90)
GFR, EST NON AFRICAN AMERICAN: 50 mL/min — AB (ref >90)
Glucose, Bld: 104 mg/dL — ABNORMAL HIGH (ref 70–99)
POTASSIUM: 3.7 meq/L (ref 3.7–5.3)
Sodium: 144 mEq/L (ref 137–147)

## 2013-08-22 MED ORDER — HYDRALAZINE HCL 10 MG PO TABS
10.0000 mg | ORAL_TABLET | Freq: Two times a day (BID) | ORAL | Status: DC
  Administered 2013-08-22 – 2013-08-23 (×3): 10 mg via ORAL
  Filled 2013-08-22 (×4): qty 1

## 2013-08-22 NOTE — Progress Notes (Signed)
Subjective: S/P Pacer and already feeling better. Fairly week.  Min Shoulder soreness. Difficulty with urination and has missed the urinal and got some on the bed. Had BM yesterday. Eating well  Objective: Vital signs in last 24 hours: Temp:  [97.4 F (36.3 C)-98.5 F (36.9 C)] 97.4 F (36.3 C) (03/14 0932) Pulse Rate:  [30-79] 66 (03/14 0932) Resp:  [18-20] 20 (03/14 0932) BP: (140-178)/(60-82) 140/67 mmHg (03/14 0932) SpO2:  [96 %-98 %] 98 % (03/14 0932) Weight:  [66.8 kg (147 lb 4.3 oz)] 66.8 kg (147 lb 4.3 oz) (03/14 0449) Weight change: -2.6 kg (-5 lb 11.7 oz)  CBG (last 3)  No results found for this basename: GLUCAP,  in the last 72 hours  Intake/Output from previous day: 03/13 0701 - 03/14 0700 In: 600 [P.O.:600] Out: 975 [Urine:975]  Physical Exam:  Awake, alert, no distress No jvd Chest decrease breath soubnds at bases, o/w clear HS +m distant, Pacer L Chest Edema 2-3+ Neuro normal    Lab Results:  Recent Labs  08/20/13 2058 08/21/13 0554 08/22/13 0542  NA 145 146 144  K 4.2 3.9 3.7  CL 109 109 108  CO2 23 23 24   GLUCOSE 147* 95 104*  BUN 45* 41* 32*  CREATININE 1.36* 1.43* 1.28  CALCIUM 9.1 9.3 8.6  MG 2.2  --   --     Recent Labs  08/20/13 2058  AST 36  ALT 40  ALKPHOS 86  BILITOT 0.7  PROT 5.3*  ALBUMIN 3.1*    Recent Labs  08/20/13 2058  WBC 5.1  NEUTROABS 3.4  HGB 11.6*  HCT 34.1*  MCV 91.2  PLT 151   Lab Results  Component Value Date   INR 1.55* 09/11/2010   INR 1.11 09/10/2010   INR 1.05 09/05/2010   No results found for this basename: CKTOTAL, CKMB, CKMBINDEX, TROPONINI,  in the last 72 hours  Recent Labs  08/20/13 2058  TSH 3.810   No results found for this basename: VITAMINB12, FOLATE, FERRITIN, TIBC, IRON, RETICCTPCT,  in the last 72 hours  Studies/Results: Dg Chest 2 View  08/22/2013   CLINICAL DATA:  Status post defibrillator placement  EXAM: CHEST  2 VIEW  COMPARISON:  08/20/2013  FINDINGS: Cardiac shadow  is stable. Chronic changes are noted in the bases bilaterally. No new focal infiltrate is seen. No pneumothorax is noted.  IMPRESSION: No acute abnormality noted.   Electronically Signed   By: Inez Catalina M.D.   On: 08/22/2013 07:16   Dg Chest 2 View  08/21/2013   CLINICAL DATA:  CHF, former smoker  EXAM: CHEST  2 VIEW  COMPARISON:  DG CHEST 1V PORT dated 09/05/2010  FINDINGS: Grossly unchanged borderline enlarged cardiac silhouette and mediastinal contours with atherosclerotic plaque within the aortic arch. Interval development of small bilateral effusions and associated bibasilar heterogeneous/consolidative opacities, left greater than right. The lungs remain hyperexpanded. Mild cephalization of flow. No pneumothorax. No acute osseus abnormalities.  IMPRESSION: Pulmonary venous congestion superimposed on advanced left cm change with small bilateral effusions and associated bibasilar opacities, left greater than right, atelectasis versus infiltrate.   Electronically Signed   By: Sandi Mariscal M.D.   On: 08/21/2013 07:58     Assessment/Plan: 1. CHB-pacemaker has helped.  Anticipate D/c tomorrow. 2. CHF right and left sided, echo:  Left ventricle: The cavity size was normal. Wall thickness was normal. Systolic function was normal c EF 60% to 65%. Wall motion was normal; there were no regional wall motion  abnormalities. - Aortic valve: Sclerosis without stenosis. Mild regurgitation. - Mitral valve: Mild regurgitation. - Left atrium: The atrium was mildly dilated. - Right atrium: The atrium was mildly dilated. - Systemic veins: Marked dilitation of the IVC. - Pericardium, extracardiac: A trivial pericardial effusion was identified posterior to the heart. Expect improvement with pacemaker and improved Cardiac outpt. Will let him auto-diurese and if needed Low dose diuretic as outpt.  Will follow up with Dr A this week and Dr Thana Farr for PPM followup   3. HTN-  Currently Hydralazine prn and will  do low dose standing. bblocker held and can restart prn 4. CKD2-baseline to 1.28    LOS: 2 days   Crystalann Korf M 08/22/2013, 11:44 AM

## 2013-08-22 NOTE — Progress Notes (Signed)
Patient ID: Justin Orozco, male   DOB: 29-Jul-1926, 80 y.o.   MRN: [id number]   Patient Name: Justin Orozco Date of Encounter: 08/22/2013     Active Problems:   CHF (congestive heart failure)   Complete heart block    SUBJECTIVE  S/p PPM, doing well. No chest pain or sob. CURRENT MEDS . aspirin EC  81 mg Oral Daily  . sodium chloride  3 mL Intravenous Q12H    OBJECTIVE  Filed Vitals:   08/21/13 1630 08/21/13 1700 08/21/13 2006 08/22/13 0449  BP: 153/62 170/63 146/60 155/71  Pulse:   76 64  Temp:   98.5 F (36.9 C) 98 F (36.7 C)  TempSrc:   Oral Oral  Resp:   18 18  Height:      Weight:    147 lb 4.3 oz (66.8 kg)  SpO2:   96% 98%    Intake/Output Summary (Last 24 hours) at 08/22/13 0812 Last data filed at 08/21/13 2300  Gross per 24 hour  Intake    600 ml  Output    975 ml  Net   -375 ml   Filed Weights   08/20/13 1819 08/21/13 0500 08/22/13 0449  Weight: 153 lb (69.4 kg) 149 lb 14.4 oz (67.994 kg) 147 lb 4.3 oz (66.8 kg)    PHYSICAL EXAM  General: Pleasant, elderly man, NAD. Neuro: Alert and oriented X 3. Moves all extremities spontaneously. HEENT:  Normal  Neck: Supple without bruits or JVD. Lungs:  Resp regular and unlabored, CTA. No hematoma. Heart: RRR no s3, s4, or murmurs. Abdomen: Soft, non-tender, non-distended, BS + x 4.  Extremities: No clubbing, cyanosis or edema. DP/PT/Radials 2+ and equal bilaterally.  Accessory Clinical Findings  CBC  Recent Labs  08/20/13 2058  WBC 5.1  NEUTROABS 3.4  HGB 11.6*  HCT 34.1*  MCV 91.2  PLT 578   Basic Metabolic Panel  Recent Labs  08/20/13 2058 08/21/13 0554 08/22/13 0542  NA 145 146 144  K 4.2 3.9 3.7  CL 109 109 108  CO2 23 23 24   GLUCOSE 147* 95 104*  BUN 45* 41* 32*  CREATININE 1.36* 1.43* 1.28  CALCIUM 9.1 9.3 8.6  MG 2.2  --   --    Liver Function Tests  Recent Labs  08/20/13 2058  AST 36  ALT 40  ALKPHOS 86  BILITOT 0.7  PROT 5.3*  ALBUMIN 3.1*   No results  found for this basename: LIPASE, AMYLASE,  in the last 72 hours Cardiac Enzymes No results found for this basename: CKTOTAL, CKMB, CKMBINDEX, TROPONINI,  in the last 72 hours BNP No components found with this basename: POCBNP,  D-Dimer No results found for this basename: DDIMER,  in the last 72 hours Hemoglobin A1C No results found for this basename: HGBA1C,  in the last 72 hours Fasting Lipid Panel No results found for this basename: CHOL, HDL, LDLCALC, TRIG, CHOLHDL, LDLDIRECT,  in the last 72 hours Thyroid Function Tests  Recent Labs  08/20/13 2058  TSH 3.810    TELE NSR with P synchronous ventricular pacing  Radiology/Studies  Dg Chest 2 View  08/22/2013   CLINICAL DATA:  Status post defibrillator placement  EXAM: CHEST  2 VIEW  COMPARISON:  08/20/2013  FINDINGS: Cardiac shadow is stable. Chronic changes are noted in the bases bilaterally. No new focal infiltrate is seen. No pneumothorax is noted.  IMPRESSION: No acute abnormality noted.   Electronically Signed   By: Linus Mako.D.  On: 08/22/2013 07:16   Dg Chest 2 View  08/21/2013   CLINICAL DATA:  CHF, former smoker  EXAM: CHEST  2 VIEW  COMPARISON:  DG CHEST 1V PORT dated 09/05/2010  FINDINGS: Grossly unchanged borderline enlarged cardiac silhouette and mediastinal contours with atherosclerotic plaque within the aortic arch. Interval development of small bilateral effusions and associated bibasilar heterogeneous/consolidative opacities, left greater than right. The lungs remain hyperexpanded. Mild cephalization of flow. No pneumothorax. No acute osseus abnormalities.  IMPRESSION: Pulmonary venous congestion superimposed on advanced left cm change with small bilateral effusions and associated bibasilar opacities, left greater than right, atelectasis versus infiltrate.   Electronically Signed   By: Sandi Mariscal M.D.   On: 08/21/2013 07:58    ASSESSMENT AND PLAN 1. CHB 2. S/p PPM  Rec: ok for discharge home, either today or  tomorrow. We will arrange PPM followup in our office.  Gregg Taylor,M.D.  08/22/2013 8:12 AM

## 2013-08-22 NOTE — Progress Notes (Signed)
Justin Orozco ambulated in the hall approximately 500 feet. He did report slight lightheadedness with walking. Symptoms resolved with sitting.

## 2013-08-23 MED ORDER — ACETAMINOPHEN 325 MG PO TABS: 325.0000 mg | ORAL_TABLET | Freq: Four times a day (QID) | ORAL | Status: DC | PRN

## 2013-08-23 MED ORDER — HYDRALAZINE HCL 10 MG PO TABS: 10.0000 mg | ORAL_TABLET | Freq: Two times a day (BID) | ORAL | Status: DC

## 2013-08-23 NOTE — Discharge Instructions (Signed)
Biventricular Pacemaker Implantation °A pacemaker is a small, lightweight, battery-powered device that is placed (implanted) under the skin in the upper chest. Your caregiver may prescribe a pacemaker for you if your heartbeat is too slow (bradycardia). A biventricular pacemaker is a pulse generator connected by wires called leads that go into the two lower chambers on the right and left sides of your heart (ventricles). It is used to treat symptoms of heart failure. The pulse generator is a small computer run by a battery. The generator creates a regular electronic pulse. The pulse is sent through the leads, which go through a blood vessel and into the ventricles of your heart. This type of pacemaker makes a weak heart more efficient. °LET YOUR CAREGIVER KNOW ABOUT °· Any allergies. Some allergies can cause serious problems during the procedure. Allergies to shellfish or agents, such as iodine, used in liquids that enhance specific areas of your body on X-ray images (contrast dyes) are especially problematic.   °· All medicines you take. These include vitamins, herbs, eyedrops, over-the-counter medicines, and creams.   °· Use of steroids.   °· Problems with numbing medicines (anesthetics).   °· Bleeding problems.   °· Past surgeries.   °· Other health problems. °RISKS AND COMPLICATIONS °Implanting a biventricular pacemaker is usually a safe procedure but problems can occur. For example:  °· Too much bleeding may occur.   °· Infection may develop.   °· Blood vessels, your lungs, or your heart may be harmed.   °· The pacemaker may not make your condition better. °BEFORE THE PROCEDURE  °· You may need to have blood tests, heart tests, or a chest X-ray done before the day of the procedure.   °· Ask your caregiver about changing or stopping your regular medicines.   °· Make plans to have someone drive you home. You will usually need to stay in the hospital overnight after the procedure.   °· Stop smoking at least 24  hours before the procedure.   °· Take a bath or shower the night before the procedure. You may need to scrub your chest with a special type of soap.   °· Do not eat or drink anything after midnight the night before your procedure. Ask if it is okay to take any needed medicine with a small sip of water. °PROCEDURE °The procedure to put a pacemaker in your chest is usually done at a hospital in a room that has a large X-ray machine called a fluoroscope. The machine will be above you during the procedure. It will help your doctor see your heart during the procedure. Implanting a biventricular pacemaker usually takes 2 5 hours. Before the procedure:  °· Small monitors will be put on your body. They will be used to check your heart, blood pressure, and oxygen level.   °· A needle will be put into a vein in your hand or arm. This is called an intravenous (IV) access tube. Fluids and medicine will flow directly into your body through the IV tube.   °· Your chest will be cleaned with a germ-killing (antiseptic) solution. Your chest may be shaved.   °· You may be given medicine to help you relax (sedative).   °· You will be given a numbing medicine called a local anesthetic. This medicine will make your chest area have no feeling while the pacemaker is implanted. You will be sleepy but awake during the procedure. °After you are numb the procedure will begin. The caregiver will:  °· Make a small cut (incision). This will make a pocket deep under your skin that will   hold the pulse generator.   Guide the leads through a large blood vessel into your heart and attach them to the heart muscles.   Test the pacemaker.   Close the incision with stitches, glue, or staples. AFTER THE PROCEDURE  You may feel pain. Some pain is normal. It may last a few days.   You may stay in a recovery area until the local anesthetic has worn off. Your blood pressure and pulse will be checked often. You will be taken to a room where your  heart beat will be monitored.   A chest X-ray will be taken. This checks that the pacemaker is in the right place.   You may stay in the hospital overnight.   The pacemaker will be checked before you go home. It can be adjusted if that is needed. Document Released: 02/20/2012 Document Reviewed: 02/20/2012 [facility] Patient Information 2014 [place], [place].

## 2013-08-23 NOTE — Progress Notes (Signed)
D/C Tele, D/C IV, D/C Instructions reviewed with pt. And pt.'s daughter, D/C paperwork and prescriptions given to pt., Pt. Belongings sent with pt., Pt. And daughter verbalized understanding of D/C instructions and pt. Showed no signs or symptoms of distress.

## 2013-08-23 NOTE — Discharge Summary (Signed)
Physician Discharge Summary  DISCHARGE SUMMARY   Patient ID: Justin Orozco MR#: [id number] DOB/AGE: 1926-12-07 80 y.o.   Attending 53 M  Patient's PPJ:KDTOIZT,IWPYKDX A, MD  Consults:Treatment Team:  Rounding Lbcardiology, MD**  Admit date: 08/20/2013 Discharge date: 08/23/2013  Discharge Diagnoses:  Active Problems:   CHF (congestive heart failure)   Complete heart block   Patient Active Problem List   Diagnosis Date Noted  . CHF (congestive heart failure) 08/20/2013  . Complete heart block 08/20/2013   Past Medical History  Diagnosis Date  . Atrial flutter   . BPH (benign prostatic hyperplasia)   . Complete heart block 08/2013    s/p STJ dual chamber pacemaker implant    Discharged Condition: Better/Stable.   Discharge Medications:   Medication List    STOP taking these medications       metoprolol 50 MG tablet  Commonly known as:  LOPRESSOR      TAKE these medications       acetaminophen 325 MG tablet  Commonly known as:  TYLENOL  Take 1-2 tablets (325-650 mg total) by mouth every 6 (six) hours as needed for mild pain or moderate pain.     aspirin EC 81 MG tablet  Take 81 mg by mouth daily.     hydrALAZINE 10 MG tablet  Commonly known as:  APRESOLINE  Take 1 tablet (10 mg total) by mouth every 12 (twelve) hours.     MULTIVITAMIN PO  Take 1 tablet by mouth daily.     tamsulosin 0.4 MG Caps capsule  Commonly known as:  FLOMAX  Take 0.4-0.8 mg by mouth at bedtime. *MD said he can take a 2nd capsule [facility]: Dg Chest 2 View  08/22/2013   CLINICAL DATA:  Status post defibrillator placement  EXAM: CHEST  2 VIEW  COMPARISON:  08/20/2013  FINDINGS: Cardiac shadow is stable. Chronic changes are noted in the bases bilaterally. No new focal infiltrate is seen. No pneumothorax is noted.  IMPRESSION: No acute abnormality noted.   Electronically Signed   By: Inez Catalina M.D.   On: 08/22/2013 07:16    Dg Chest 2 View  08/21/2013   CLINICAL DATA:  CHF, former smoker  EXAM: CHEST  2 VIEW  COMPARISON:  DG CHEST 1V PORT dated 09/05/2010  FINDINGS: Grossly unchanged borderline enlarged cardiac silhouette and mediastinal contours with atherosclerotic plaque within the aortic arch. Interval development of small bilateral effusions and associated bibasilar heterogeneous/consolidative opacities, left greater than right. The lungs remain hyperexpanded. Mild cephalization of flow. No pneumothorax. No acute osseus abnormalities.  IMPRESSION: Pulmonary venous congestion superimposed on advanced left cm change with small bilateral effusions and associated bibasilar opacities, left greater than right, atelectasis versus infiltrate.   Electronically Signed   By: Sandi Mariscal M.D.   On: 08/21/2013 07:58    History of Present Illness:  Justin Orozco is a gentleman well-known to Dr A c a H/O paroxysmal atrial flutter followed by Dr. [name] and prostate cancer whe presented to med attention 08/20/13 c increasing swelling for about 5-6 days. 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 He is widowed but does have a friend and a very supportive family. He is fully independent in his ADLs. He's had no chest pain or shortness of breath. He's had no syncope or near syncope. He's had no fever or chills. He's had no change in bowel or urinary habits. In the  office he was found to have a heart rate in the 30s with fairly significant lower extremity swelling and questionable pleural effusions. Office EKG demonstrated either atrial fibrillation with very slow ventricular response or a high grade AV block former is favored. Dr A did discuss this with cardiology and admitted overnight for observation, holding the beta blocker and consideration for pacemaker.   Hospital Course:  Pt admitted to room 3E30 c Bradycardia/atrial fibrillation with slow ventricular response/ and Vol Overload.  He was admitted for diuresis, cardiac  workup and consideration for cardiac pacemaker On arrival pt alert and oriented and ambulatory. BLE +3 pitting edema, BP 202/62 manually and HR sustaining in the 30's. Pt asymptomatic. Rapid response called and MD notified.  Cards saw the pt and EKG showed complete heart block. He was tolerating his rhythm well. Lopressor had been stopped. Initial plan was to observe whether his AV conduction improves off of beta blocker. If not, he will need a pacemaker. EP rounded on him 3/13. HR still 27-30 with hypertension.  He was taken to EP lab and he had a L upper chest successful implantation by Dr Rayann Heman of a [place] DR dual-chamber pacemaker for symptomatic complete heart block. No early apparent complications. L arm was placed in a sling. Dr Lovena Le saw pt the following am and ok for discharge home, either today or tomorrow. We will arrange PPM followup in our office. i saw him on 3/14 and he was not ready for D/c. CXR on 3/14 showed: No acute abnormality noted  However he is ready for D/c today on 3/15:  S/P Pacer- feeling better, much less edema.  Fairly week but improving - walking well.  Min Shoulder soreness.  Eating well/Having BMs.   Systolic hypertension with blood pressure above 200 tonight. Some of this reflected his large stroke volume secondary to a heart rate of 30. He had been given one dose of Lasix for his edema.  Low-dose hydralazine was added.  I have yet to restart BB at this moment..  1. CHB-pacemaker has helped tremendously. D/c today.  2. CHF right sided, echo: Left ventricle: The cavity size was normal. Wall thickness was normal. Systolic function was normal c EF 60% to 65%. Wall motion was normal; there were no regional wall motion abnormalities. - Aortic valve: Sclerosis without stenosis. Mild regurgitation. - Mitral valve: Mild regurgitation. - Left atrium: The atrium was mildly dilated. - Right atrium: The atrium was mildly dilated. - Systemic veins: Marked  dilitation of the IVC. - Pericardium, extracardiac: A trivial pericardial effusion was identified posterior to the heart.  Expect improvement with pacemaker and improved Cardiac outpt.  Will let him auto-diurese and if needed Low dose diuretic we can add as outpt.  Will follow up with Dr A this week and Dr Thana Farr for PPM followup   3. HTN- Currently Hydralazine and will do low dose standing. bblocker held and can restart prn  4. CKD2-baseline to 1.28    Day of Discharge Exam BP 153/67  Pulse 63  Temp(Src) 97.3 F (36.3 C) (Oral)  Resp 18  Ht 5\' 9"  (1.753 m)  Wt 66.316 kg (146 lb 3.2 oz)  BMI 21.58 kg/m2  SpO2 96%  Physical Exam: Awake, alert, no distress  No jvd  Chest decrease breath soubnds at bases, o/w clear  HS +m distant, Pacer L Chest  Edema 2+ - he states much better than admission. Neuro normal    Discharge Labs:  Recent Labs  08/20/13  2058 08/21/13 0554 08/22/13 0542  NA 145 146 144  K 4.2 3.9 3.7  CL 109 109 108  CO2 23 23 24   GLUCOSE 147* 95 104*  BUN 45* 41* 32*  CREATININE 1.36* 1.43* 1.28  CALCIUM 9.1 9.3 8.6  MG 2.2  --   --     Recent Labs  08/20/13 2058  AST 36  ALT 40  ALKPHOS 86  BILITOT 0.7  PROT 5.3*  ALBUMIN 3.1*    Recent Labs  08/20/13 2058  WBC 5.1  NEUTROABS 3.4  HGB 11.6*  HCT 34.1*  MCV 91.2  PLT 151   No results found for this basename: CKTOTAL, CKMB, CKMBINDEX, TROPONINI,  in the last 72 hours  Recent Labs  08/20/13 2058  TSH 3.810   No results found for this basename: VITAMINB12, FOLATE, FERRITIN, TIBC, IRON, RETICCTPCT,  in the last 72 hours Lab Results  Component Value Date   INR 1.55* 09/11/2010   INR 1.11 09/10/2010   INR 1.05 09/05/2010       Discharge instructions:  Future Appointments Provider Department Dept Phone   08/26/2013 11:30 AM Lorretta Harp, MD [facility] [phone]   09/02/2013 2:30 PM [facility] [place] [facility] [phone]    12/04/2013 9:45 AM [name], MD [place] [facility] [phone]     01-Home or Self Care Follow-up Information   Follow up with [name],[name], MD In 1 week.   Specialty:  Internal Medicine   Contact information:   [facility] [place] [address] [phone]        Disposition: home  Follow-up Appts: Follow-up with Dr. Reynaldo Minium at [facility] in 1 week.  Call for appointment.  Condition on Discharge: stable/better  Tests Needing Follow-up: Labs and volume status c BP med adjustment.  Time spent in discharge (includes decision making & examination of pt): 35 min  Signed: Moana Munford M 08/23/2013, 8:28 AM

## 2013-08-24 ENCOUNTER — Encounter: Payer: Self-pay | Admitting: Internal Medicine

## 2013-08-24 ENCOUNTER — Telehealth: Payer: Self-pay | Admitting: Cardiovascular Disease

## 2013-08-24 NOTE — Telephone Encounter (Signed)
Pt had pacemaker put in Friday and he came home yesterday from the hospital. She wants to know if he still need the appt with Dr [name] on Wednesday?

## 2013-08-24 NOTE — Telephone Encounter (Signed)
Advised that patient come in - has not been seen in office since EPIC - recent pacemaker. Daughter agreed with plan & voiced understanding.

## 2013-08-26 ENCOUNTER — Ambulatory Visit (INDEPENDENT_AMBULATORY_CARE_PROVIDER_SITE_OTHER): Payer: Medicare Other | Admitting: Cardiovascular Disease

## 2013-08-26 ENCOUNTER — Encounter: Payer: Self-pay | Admitting: Cardiovascular Disease

## 2013-08-26 VITALS — BP 147/81 | HR 99 | Ht 69.0 in | Wt 148.3 lb

## 2013-08-26 DIAGNOSIS — R600 Localized edema: Secondary | ICD-10-CM

## 2013-08-26 DIAGNOSIS — R609 Edema, unspecified: Secondary | ICD-10-CM | POA: Diagnosis not present

## 2013-08-26 DIAGNOSIS — I442 Atrioventricular block, complete: Secondary | ICD-10-CM

## 2013-08-26 DIAGNOSIS — Z79899 Other long term (current) drug therapy: Secondary | ICD-10-CM | POA: Diagnosis not present

## 2013-08-26 DIAGNOSIS — I4892 Unspecified atrial flutter: Secondary | ICD-10-CM | POA: Diagnosis not present

## 2013-08-26 MED ORDER — POTASSIUM CHLORIDE ER 10 MEQ PO TBCR: 10.0000 meq | EXTENDED_RELEASE_TABLET | Freq: Every day | ORAL | Status: DC

## 2013-08-26 MED ORDER — HYDROCHLOROTHIAZIDE 12.5 MG PO CAPS: 12.5000 mg | ORAL_CAPSULE | Freq: Every day | ORAL | Status: DC

## 2013-08-26 NOTE — Assessment & Plan Note (Signed)
He noted bilateral lower extremity edema approximately a week prior to admission for his complete heart block requiring insertion of permanent transvenous pacemaker. He has 2-3+ pitting edema and some dyspnea. Echo performed 3 years ago showed normal LV systolic function. I would put him on low-dose diuretic and potassium repletion, check a BMET In 2-3 weeks and have him see a mid-level provider back after that. I'm also going to get a 2-D echocardiogram for LV function.

## 2013-08-26 NOTE — Assessment & Plan Note (Signed)
The patient was recently noted to have a ventricular response of 30 and was symptomatic. He was admitted and consulted on by Dr. Mare Ferrari and underwent permanent transvenous pacemaker insertion by Dr. [name]. This was approximately one week ago. He is symptomatically improved and has appointment to see Dr. Rayann Heman in approximately one to 2 weeks.

## 2013-08-26 NOTE — Patient Instructions (Signed)
  Your physician wants you to follow-up with him in : 1 year with Dr [name]                                            and with an extender in : 1 months and again in 6 months                    You will receive a reminder letter in the mail one month in advance. If you don't receive a letter, please call our office to schedule the follow-up appointment.   Your physician recommends that you return for lab work in: 1-2 weeks   Your physician has recommended you make the following change in your medication:  1. Start Hctz 12.5mg  daily 2. Start Potassium chloride 30meq daily   Your physician has ordered the following tests: echocardiogrm

## 2013-08-26 NOTE — Progress Notes (Signed)
08/26/2013 Justin Orozco [facility]   06/25/1926  [id number]  Primary Physician [name],[name], MD Primary Cardiologist: Lorretta Harp MD Renae Gloss   HPI:  The patient is a delightful 80 year old, thin appearing, widowed Caucasian male, father of 2, grandfather to 4 grandchildren, a patient of Dr. Tamera Punt, whom I last saw in the office a year ago. He has a history of paroxysmal atrial flutter in the past. He has had a negative Myoview and a normal 2D echo.he was admitted for symptomatic bradycardia and complete heart block with ventricular response of 30 on March 12. He underwent insertion of a permanent transvenous pacemaker by Dr. [name] with improvement in his symptoms. He was complaining of shortness of breath prior to this with lower extremity edema but denied chest pain.    Current Outpatient Prescriptions  Medication Sig Dispense Refill  . acetaminophen (TYLENOL) 325 MG tablet Take 1-2 tablets (325-650 mg total) by mouth every 6 (six) hours as needed for mild pain or moderate pain.      Marland Kitchen aspirin EC 81 MG tablet Take 81 mg by mouth daily.      . hydrALAZINE (APRESOLINE) 10 MG tablet Take 1 tablet (10 mg total) by mouth every 12 (twelve) hours.  60 tablet  3  . tamsulosin (FLOMAX) 0.4 MG CAPS capsule Take 0.4-0.8 mg by mouth at bedtime. *MD said he can take a 2nd capsule if he needs it*      . hydrochlorothiazide (MICROZIDE) 12.5 MG capsule Take 1 capsule (12.5 mg total) by mouth daily.  90 capsule  3  . potassium chloride (K-DUR) 10 MEQ tablet Take 1 tablet (10 mEq total) by mouth daily.  90 tablet  3   No current facility-administered medications for this visit.    No Known Allergies  History   Social History  . Marital Status: Widowed    Spouse Name: N/A    Number of Children: N/A  . Years of Education: N/A   Occupational History  . Not on file.   Social History Main Topics  . Smoking status: Passive Smoke Exposure - Never Smoker  .  Smokeless tobacco: Not on file  . Alcohol Use: 2.3 oz/week    3 Glasses of wine, 1 Drinks containing 0.5 oz of alcohol per week  . Drug Use: No  . Sexual Activity: No   Other Topics Concern  . Not on file   Social History Narrative  . No narrative on file     Review of Systems: General: negative for chills, fever, night sweats or weight changes.  Cardiovascular: negative for chest pain, dyspnea on exertion, edema, orthopnea, palpitations, paroxysmal nocturnal dyspnea or shortness of breath Dermatological: negative for rash Respiratory: negative for cough or wheezing Urologic: negative for hematuria Abdominal: negative for nausea, vomiting, diarrhea, bright red blood per rectum, melena, or hematemesis Neurologic: negative for visual changes, syncope, or dizziness All other systems reviewed and are otherwise negative except as noted above.    Blood pressure 147/81, pulse 99, height 5\' 9"  (1.753 m), weight 148 lb 4.8 oz (67.268 kg).  General appearance: alert and no distress Neck: no adenopathy, no carotid bruit, no JVD, supple, symmetrical, trachea midline and thyroid not enlarged, symmetric, no tenderness/mass/nodules Lungs: clear to auscultation bilaterally Heart: regular rate and rhythm, S1, S2 normal, no murmur, click, rub or gallop Extremities: extremities normal, atraumatic, no cyanosis or edema  EKG not performed today  ASSESSMENT AND PLAN:   Complete heart block The patient was  recently noted to have a ventricular response of 30 and was symptomatic. He was admitted and consulted on by Dr. Mare Ferrari and underwent permanent transvenous pacemaker insertion by Dr. [name]. This was approximately one week ago. He is symptomatically improved and has appointment to see Dr. Rayann Heman in approximately one to 2 weeks.  Lower extremity edema He noted bilateral lower extremity edema approximately a week prior to admission for his complete heart block requiring insertion of  permanent transvenous pacemaker. He has 2-3+ pitting edema and some dyspnea. Echo performed 3 years ago showed normal LV systolic function. I would put him on low-dose diuretic and potassium repletion, check a BMET In 2-3 weeks and have him see a mid-level provider back after that. I'm also going to get a 2-D echocardiogram for LV function.      Lorretta Harp MD FACP,FACC,FAHA, [facility] 08/26/2013 12:53 PM

## 2013-09-02 ENCOUNTER — Ambulatory Visit (INDEPENDENT_AMBULATORY_CARE_PROVIDER_SITE_OTHER): Payer: Medicare Other | Admitting: *Deleted

## 2013-09-02 DIAGNOSIS — I442 Atrioventricular block, complete: Secondary | ICD-10-CM | POA: Diagnosis not present

## 2013-09-02 LAB — MDC_IDC_ENUM_SESS_TYPE_INCLINIC
Battery Remaining Longevity: 72 mo
Brady Statistic RA Percent Paced: 20 %
Brady Statistic RV Percent Paced: 99.88 %
Date Time Interrogation Session: 20150325150242
Implantable Pulse Generator Model: 2240
Implantable Pulse Generator Serial Number: 7596067
Lead Channel Impedance Value: 637.5 Ohm
Lead Channel Pacing Threshold Amplitude: 0.75 V
Lead Channel Pacing Threshold Pulse Width: 0.5 ms
Lead Channel Pacing Threshold Pulse Width: 0.5 ms
Lead Channel Sensing Intrinsic Amplitude: 3.5 mV
Lead Channel Setting Pacing Amplitude: 3.5 V
Lead Channel Setting Pacing Pulse Width: 0.5 ms
Lead Channel Setting Sensing Sensitivity: 5 mV
MDC IDC MSMT BATTERY VOLTAGE: 3.05 V
MDC IDC MSMT LEADCHNL RA IMPEDANCE VALUE: 512.5 Ohm
MDC IDC MSMT LEADCHNL RA PACING THRESHOLD AMPLITUDE: 0.75 V
MDC IDC SET LEADCHNL RV PACING AMPLITUDE: 3.5 V

## 2013-09-02 NOTE — Progress Notes (Signed)
Wound check appointment. Steri-strips removed. Wound without redness or edema. Incision edges approximated, wound well healed. Normal device function. Thresholds, sensing, and impedances consistent with implant measurements. Device programmed at 3.5V/auto capture programmed on for extra safety margin until 3 month visit. Histogram distribution appropriate for patient and level of activity. 19 mode switches the longest > 7 minutes, - coumadin.  No high ventricular rates or PMT noted. Patient educated about wound care, arm mobility, lifting restrictions. ROV in 3 months with implanting physician.

## 2013-09-07 ENCOUNTER — Ambulatory Visit (HOSPITAL_COMMUNITY)
Admission: RE | Admit: 2013-09-07 | Discharge: 2013-09-07 | Disposition: A | Discharge status: Home / Self Care | Payer: Medicare Other | Source: Ambulatory Visit | Attending: Cardiovascular Disease | Admitting: Cardiovascular Disease

## 2013-09-07 DIAGNOSIS — I319 Disease of pericardium, unspecified: Secondary | ICD-10-CM

## 2013-09-07 DIAGNOSIS — Z79899 Other long term (current) drug therapy: Secondary | ICD-10-CM | POA: Diagnosis not present

## 2013-09-07 DIAGNOSIS — R609 Edema, unspecified: Secondary | ICD-10-CM | POA: Diagnosis not present

## 2013-09-07 DIAGNOSIS — R600 Localized edema: Secondary | ICD-10-CM

## 2013-09-07 NOTE — Progress Notes (Signed)
  Echocardiogram 2D Echocardiogram has been performed.  Justin Orozco 09/07/2013, 11:39 AM

## 2013-09-08 LAB — BASIC METABOLIC PANEL WITH GFR
BUN: 34 mg/dL — ABNORMAL HIGH (ref 6–23)
CHLORIDE: 104 meq/L (ref 96–112)
CO2: 27 mEq/L (ref 19–32)
Calcium: 9.8 mg/dL (ref 8.4–10.5)
Creat: 1.17 mg/dL (ref 0.50–1.35)
GFR, EST AFRICAN AMERICAN: 66 mL/min
GFR, EST NON AFRICAN AMERICAN: 57 mL/min — AB
Glucose, Bld: 90 mg/dL (ref 70–99)
Potassium: 5.2 mEq/L (ref 3.5–5.3)
Sodium: 139 mEq/L (ref 135–145)

## 2013-09-21 DIAGNOSIS — IMO0002 Reserved for concepts with insufficient information to code with codable children: Secondary | ICD-10-CM | POA: Diagnosis not present

## 2013-09-21 DIAGNOSIS — E785 Hyperlipidemia, unspecified: Secondary | ICD-10-CM | POA: Diagnosis not present

## 2013-09-21 DIAGNOSIS — M810 Age-related osteoporosis without current pathological fracture: Secondary | ICD-10-CM | POA: Diagnosis not present

## 2013-09-21 DIAGNOSIS — Z8546 Personal history of malignant neoplasm of prostate: Secondary | ICD-10-CM | POA: Diagnosis not present

## 2013-09-21 DIAGNOSIS — R609 Edema, unspecified: Secondary | ICD-10-CM | POA: Diagnosis not present

## 2013-09-21 DIAGNOSIS — Z950 Presence of cardiac pacemaker: Secondary | ICD-10-CM | POA: Diagnosis not present

## 2013-09-21 DIAGNOSIS — I459 Conduction disorder, unspecified: Secondary | ICD-10-CM | POA: Diagnosis not present

## 2013-09-28 ENCOUNTER — Encounter: Payer: Self-pay | Admitting: Internal Medicine

## 2013-10-09 ENCOUNTER — Ambulatory Visit (INDEPENDENT_AMBULATORY_CARE_PROVIDER_SITE_OTHER): Payer: Medicare Other | Admitting: Cardiology

## 2013-10-09 ENCOUNTER — Encounter: Payer: Self-pay | Admitting: Cardiology

## 2013-10-09 VITALS — BP 120/60 | HR 76 | Ht 69.0 in | Wt 134.0 lb

## 2013-10-09 DIAGNOSIS — Z950 Presence of cardiac pacemaker: Secondary | ICD-10-CM

## 2013-10-09 DIAGNOSIS — I509 Heart failure, unspecified: Secondary | ICD-10-CM | POA: Diagnosis not present

## 2013-10-09 NOTE — Patient Instructions (Addendum)
1. Continue your medications you are taking now  2.Your physician recommends that you schedule a follow-up appointment in:  One year with Dr. [name]  3. If you develop any Shortness of breath or Chest pain or any other problems that concern you ,don't hesitate to call  4. See Dr. Rayann Heman as scheduled

## 2013-10-09 NOTE — Progress Notes (Signed)
Patient ID: Justin Orozco, male   DOB: 04-May-1927, 80 y.o.   MRN: [id number]     10/09/2013 [facility]   06-06-1927  [id number]  Primary Physician JustinRICHARD A, MD Primary Cardiologist: Justin Harp MD Justin Orozco   HPI:  The patient is Orozco delightful 80 year old, thin appearing, widowed Caucasian male, father of 2, grandfather to 4 grandchildren, Orozco patient of Dr. Tamera Orozco.  Dr. Gwenlyn Orozco is his primary cardiologist.  He has Orozco history of paroxysmal atrial flutter in the past. He has had Orozco negative Myoview and Orozco normal 2D echo. He was admitted for symptomatic bradycardia and complete heart block with ventricular response of 30 on August 20, 2013. He underwent insertion of Orozco permanent transvenous pacemaker by Dr. Thompson Orozco with improvement in his symptoms. He was complaining of shortness of breath prior to this with lower extremity edema but denied chest pain.  He was seen by Dr. Gwenlyn Orozco for post hospital f/u on 08/26/13. He was doing fairly well, but had 2-3+ bilateral LEE. Dr. Gwenlyn Orozco opted to place him on HCTZ and daily potassium supplementation. He ordered for him to have Orozco BMP 2 weeks afterwards to assess renal function and electrolytes. Orozco BMP was obtained on 09/07/13 and both renal function and potassium were WNL. He also ordered Orozco 2D echo to reassess LV systolic function. EF was 40-45%. This was worse compared to prior study, which demonstrated an EF of 60%. There was also grade I diastolic dysfunction. Despite this, he denies any SOB. He has had no chest pain and his LEE has resolved. He denies orthopnea, PND and LEE.     Current Outpatient Prescriptions  Medication Sig Dispense Refill  . acetaminophen (TYLENOL) 325 MG tablet Take 1-2 tablets (325-650 mg total) by mouth every 6 (six) hours as needed for mild pain or moderate pain.      Marland Kitchen aspirin EC 81 MG tablet Take 81 mg by mouth daily.      . hydrochlorothiazide (MICROZIDE) 12.5 MG capsule Take 1 capsule (12.5 mg total)  by mouth daily.  90 capsule  3  . metoprolol (LOPRESSOR) 50 MG tablet       . potassium chloride (K-DUR) 10 MEQ tablet Take 1 tablet (10 mEq total) by mouth daily.  90 tablet  3   No current facility-administered medications for this visit.    No Known Allergies  History   Social History  . Marital Status: Widowed    Spouse Name: N/Orozco    Number of Children: N/Orozco  . Years of Education: N/Orozco   Occupational History  . Not on file.   Social History Main Topics  . Smoking status: Passive Smoke Exposure - Never Smoker  . Smokeless tobacco: Not on file  . Alcohol Use: 2.3 oz/week    3 Glasses of wine, 1 Drinks containing 0.5 oz of alcohol per week  . Drug Use: No  . Sexual Activity: No   Other Topics Concern  . Not on file   Social History Narrative  . No narrative on file     Review of Systems: General: negative for chills, fever, night sweats or weight changes.  Cardiovascular: negative for chest pain, dyspnea on exertion, edema, orthopnea, palpitations, paroxysmal nocturnal dyspnea or shortness of breath Dermatological: negative for rash Respiratory: negative for cough or wheezing Urologic: negative for hematuria Abdominal: negative for nausea, vomiting, diarrhea, bright red blood per rectum, melena, or hematemesis Neurologic: negative for visual changes, syncope, or dizziness All other systems reviewed  and are otherwise negative except as noted above.    Blood pressure 120/60, pulse 76, height 5\' 9"  (1.753 m), weight 134 lb (60.782 kg).  General appearance: alert and no distress Neck: no adenopathy, no carotid bruit, no JVD, supple, symmetrical, trachea midline and thyroid not enlarged, symmetric, no tenderness/mass/nodules Lungs: clear to auscultation bilaterally Heart: regular rate and rhythm, S1, S2 normal, no murmur, click, rub or gallop Extremities: extremities normal, atraumatic, no cyanosis or edema  EKG: ventricular rate of 76 bpm  ASSESSMENT AND PLAN:    CHF (congestive heart failure) Recent 2D echo demonstrated drop in systolic function to 90-24%. He denies any SOB. No CP. Bilateral LEE has resolved with initiation of HTCZ. F/u BMP demonstrated that renal function and potassium have remained stable. BP also stable. Continue daily HCTZ with potassium supplementation.   Pacemaker Followed by Dr. Rayann Orozco. Next scheduled appointment is 12/04/13.  Plan: Continue current plan of care. Will discuss change in systolic function with Dr. Gwenlyn Orozco. He denies any SOB since undergoing PPM insertion. Edema has resolved with diuretic. He denies any recent chest pain. ? Need for ischemic eval. He will likely also benefit from addition of Orozco low dose ACE-I. After discussion with Dr. Gwenlyn Orozco, we will contact patient via phone, should we decided to start Orozco low dose ACE/ARB and will notify if Orozco NST is needed. If medications are added, we will e-prescribe order to patient's pharmacy.   Justin Jester, PA-C 10/09/13 2:49 pm

## 2013-10-09 NOTE — Assessment & Plan Note (Signed)
Recent 2D echo demonstrated drop in systolic function to 61-53%. He denies any SOB. No CP. Bilateral LEE has resolved with initiation of HTCZ. F/u BMP demonstrated that renal function and potassium have remained stable. BP also stable. Continue daily HCTZ with potassium supplementation.

## 2013-10-09 NOTE — Assessment & Plan Note (Signed)
Followed by Dr. Rayann Heman. Next scheduled appointment is 12/04/13.

## 2013-10-11 ENCOUNTER — Other Ambulatory Visit: Payer: Self-pay | Admitting: Cardiovascular Disease

## 2013-10-12 ENCOUNTER — Telehealth: Payer: Self-pay | Admitting: Cardiovascular Disease

## 2013-10-12 NOTE — Telephone Encounter (Signed)
Returned call and pt verified x 2.  Pt stated he was looking at his medications.  Stated there's one, hydralazine, he was supposed to stop taking.  Pt also stated he was told to stop taking metoprolol.  Pt informed RN reviewed chart and hydralazine was dc'd, but not metoprolol.  Discharge instructions also stated pt is to "continue your medications you are taking now."  Pt informed RN will have JC, LPN review chart as he works w/ Event [employer], [employer] and may be able to advise further.  Pt asked to speak w/ JC now.  [place] notified and reviewed chart.  Stated he will contact Brittainy and call pt back.  Informed JC, LPN that pt is not available during lunch time.  Pt informed and agreed w/ plan.  Pt will await a call back later this afternoon.  Message forwarded to [employer], PA-C/J.C. Tressie Stalker, LPN.

## 2013-10-12 NOTE — Telephone Encounter (Signed)
Saw Justin Orozco last week.  Has questions about discontinuing a med that Dr Virgina Jock prescribed him.Marland KitchenMarland KitchenHydralazine  Please call.

## 2013-10-12 NOTE — Telephone Encounter (Signed)
Rx refill sent to patient pharmacy   

## 2013-10-12 NOTE — Telephone Encounter (Signed)
Wants to talk to jc  Did not understand message

## 2013-10-14 NOTE — Telephone Encounter (Signed)
I will talk with brittany on friday

## 2013-10-20 NOTE — Telephone Encounter (Signed)
Talked with brittany PA and it was decided that on last visit pt. Was instructed to stop taking apresoline and continue metoprolol, I attempted to call pt several times over the past week without sucess

## 2013-10-23 NOTE — Addendum Note (Signed)
Addended by: Dolan Amen. on: 10/23/2013 03:01 PM   Modules accepted: Orders

## 2013-11-17 ENCOUNTER — Other Ambulatory Visit: Payer: Self-pay | Admitting: *Deleted

## 2013-11-17 DIAGNOSIS — I998 Other disorder of circulatory system: Secondary | ICD-10-CM

## 2013-11-18 ENCOUNTER — Telehealth: Payer: Self-pay | Admitting: *Deleted

## 2013-11-18 ENCOUNTER — Telehealth: Payer: Self-pay | Admitting: Cardiovascular Disease

## 2013-11-18 NOTE — Telephone Encounter (Signed)
Patient is returning a call from [place].

## 2013-11-18 NOTE — Telephone Encounter (Signed)
Pt called to tell Dr [name] that he is cancelling his Stress Test on 11-25-13. He says he does not feel it is necessary,he will do his exercise program and that will help him.

## 2013-11-18 NOTE — Telephone Encounter (Signed)
Pt's potassium reordered

## 2013-11-18 NOTE — Telephone Encounter (Signed)
I called Brittney and she said she had not called this pt.

## 2013-11-18 NOTE — Telephone Encounter (Signed)
Justin Orozco is declined to have a stress test done, he feels like it isn't needed and is going do his exercise program

## 2013-11-25 ENCOUNTER — Other Ambulatory Visit: Payer: Self-pay | Admitting: Dermatology

## 2013-11-25 ENCOUNTER — Encounter (HOSPITAL_COMMUNITY): Payer: Medicare Other

## 2013-11-25 DIAGNOSIS — L570 Actinic keratosis: Secondary | ICD-10-CM | POA: Diagnosis not present

## 2013-11-25 DIAGNOSIS — C44621 Squamous cell carcinoma of skin of unspecified upper limb, including shoulder: Secondary | ICD-10-CM | POA: Diagnosis not present

## 2013-11-25 DIAGNOSIS — Z85828 Personal history of other malignant neoplasm of skin: Secondary | ICD-10-CM | POA: Diagnosis not present

## 2013-12-02 ENCOUNTER — Other Ambulatory Visit: Payer: Self-pay

## 2013-12-03 DIAGNOSIS — C4492 Squamous cell carcinoma of skin, unspecified: Secondary | ICD-10-CM | POA: Diagnosis not present

## 2013-12-03 DIAGNOSIS — Z85828 Personal history of other malignant neoplasm of skin: Secondary | ICD-10-CM | POA: Diagnosis not present

## 2013-12-04 ENCOUNTER — Encounter: Payer: Medicare Other | Admitting: Internal Medicine

## 2013-12-07 DIAGNOSIS — H2510 Age-related nuclear cataract, unspecified eye: Secondary | ICD-10-CM | POA: Diagnosis not present

## 2013-12-31 ENCOUNTER — Other Ambulatory Visit: Payer: Self-pay

## 2014-01-01 ENCOUNTER — Ambulatory Visit (INDEPENDENT_AMBULATORY_CARE_PROVIDER_SITE_OTHER): Payer: Medicare Other | Admitting: Internal Medicine

## 2014-01-01 ENCOUNTER — Encounter: Payer: Self-pay | Admitting: Internal Medicine

## 2014-01-01 VITALS — BP 130/76 | HR 82 | Ht 69.5 in | Wt 135.2 lb

## 2014-01-01 DIAGNOSIS — I442 Atrioventricular block, complete: Secondary | ICD-10-CM | POA: Diagnosis not present

## 2014-01-01 DIAGNOSIS — I4892 Unspecified atrial flutter: Secondary | ICD-10-CM

## 2014-01-01 DIAGNOSIS — Z950 Presence of cardiac pacemaker: Secondary | ICD-10-CM | POA: Diagnosis not present

## 2014-01-01 NOTE — Progress Notes (Signed)
PCP: Geoffery Lyons, MD Primary Cardiologist: Justin Orozco is a 80 y.o. male who presents today for routine electrophysiology followup.  Since pacemaker implant 08/2013, the patient reports doing reasonably well.  Device interrogation today demonstrates brief periods of atrial flutter, the longest of which was <30 minutes. The patient is unaware of these episodes.   Today, he denies symptoms of palpitations, chest pain, shortness of breath,  lower extremity edema, dizziness, presyncope, or syncope.  The patient is otherwise without complaint today.   Past Medical History  Diagnosis Date  . Atrial flutter   . BPH (benign prostatic hyperplasia)   . Complete heart block 08/2013    s/p STJ dual chamber pacemaker implant  . Lower extremity edema   . Paroxysmal atrial flutter   . Lower extremity edema   . CHF (congestive heart failure)    Past Surgical History  Procedure Laterality Date  . Tonsillectomy      "when i was 38 or 80 years old"  . Total hip arthroplasty  08/2010    right hip - Dr French Ana  . Pacemaker insertion  08/21/2013    STJ Assurity dual chamber pacemaker implanted by Dr Rayann Heman for complete heart block    ROS- all systems are reviewed and negative except as per HPI above  Current Outpatient Prescriptions  Medication Sig Dispense Refill  . aspirin EC 81 MG tablet Take 81 mg by mouth daily.      . hydrochlorothiazide (MICROZIDE) 12.5 MG capsule Take 1 capsule (12.5 mg total) by mouth daily.  90 capsule  3  . metoprolol (LOPRESSOR) 50 MG tablet TAKE ONE-HALF TABLET BY MOUTH TWICE DAILY  30 tablet  6  . potassium chloride (K-DUR) 10 MEQ tablet Take 1 tablet (10 mEq total) by mouth daily.  90 tablet  3  . hydrALAZINE (APRESOLINE) 10 MG tablet Take 1 tablet by mouth daily.       No current facility-administered medications for this visit.    Physical Exam: Filed Vitals:   01/01/14 1553  BP: 130/76  Pulse: 82  Height: 5' 9.5" (1.765 m)  Weight: 135 lb 3.2  oz (61.326 kg)    GEN- The patient is well appearing, alert and oriented x 3 today.   Head- normocephalic, atraumatic Eyes-  Sclera clear, conjunctiva pink Ears- hearing intact Oropharynx- clear Lungs- Clear to ausculation bilaterally, normal work of breathing Chest- pacemaker pocket is well healed Heart- Regular rate and rhythm, no murmurs, rubs or gallops, PMI not laterally displaced GI- soft, NT, ND, + BS Extremities- no clubbing, cyanosis, or edema  Pacemaker interrogation- reviewed in detail today,  See PACEART report  Assessment and Plan:  1. Complete heart block Normal pacemaker function See Pace Art report No changes today  2.  Atrial flutter Asymptomatic and brief, infrequent episodes CHADS2VASC 3.  I will defer anticoagulation to Dr [name].  If his arrhythmia burden increases then he may require long term anticoagulation  Compliance with merlin remote transmissions is encouraged Return to the device clinic in 1year

## 2014-01-01 NOTE — Patient Instructions (Addendum)
Look for [employer] home monitor at home.  If you have box, hook up and call us to let us know so we can verify we are receiving transmissions.  If you do not have box, call us back to let us know so that we can send you one.    Remote monitoring is used to monitor your Pacemaker of ICD from home. This monitoring reduces the number of office visits required to check your device to one time per year. It allows [place] to keep an eye on the functioning of your device to ensure it is working properly. You are scheduled for a device check from home on April 05, 2014. You may send your transmission at any time that day. If you have a wireless device, the transmission will be sent automatically. After your physician reviews your transmission, you will receive a postcard with your next transmission date.  Your physician wants you to follow-up in: 1 year with Dr Rayann Heman.  You will receive a reminder letter in the mail two months in advance. If you don't receive a letter, please call our office to schedule the follow-up appointment.  Your physician recommends that you schedule a follow-up appointment in: 3 months with Dr [name]

## 2014-01-14 ENCOUNTER — Telehealth: Payer: Self-pay | Admitting: *Deleted

## 2014-01-14 NOTE — Telephone Encounter (Signed)
Message copied by Chauncy Lean on Thu Jan 14, 2014  1:28 PM ------      Message from: Lorretta Harp      Created: Mon Jan 04, 2014  6:53 AM      Regarding: Oral Anticoagulation       S/P PTVPM insertion. Dr. Rayann Heman raises the issue of oral anticaogulatin given increased CHA2DSVASC score of 3. ROV with me to discuss.            JJB ------

## 2014-01-14 NOTE — Telephone Encounter (Signed)
I spoke with patient and made him an appt to see Dr [name] 8/24.  Patient was agreeable.

## 2014-01-22 ENCOUNTER — Encounter: Payer: Self-pay | Admitting: *Deleted

## 2014-02-01 ENCOUNTER — Encounter: Payer: Self-pay | Admitting: Cardiovascular Disease

## 2014-02-01 ENCOUNTER — Ambulatory Visit (INDEPENDENT_AMBULATORY_CARE_PROVIDER_SITE_OTHER): Payer: Medicare Other | Admitting: Cardiovascular Disease

## 2014-02-01 VITALS — BP 108/70 | HR 100 | Ht 69.25 in | Wt 133.0 lb

## 2014-02-01 DIAGNOSIS — R609 Edema, unspecified: Secondary | ICD-10-CM | POA: Diagnosis not present

## 2014-02-01 DIAGNOSIS — R600 Localized edema: Secondary | ICD-10-CM

## 2014-02-01 DIAGNOSIS — I442 Atrioventricular block, complete: Secondary | ICD-10-CM | POA: Diagnosis not present

## 2014-02-01 NOTE — Progress Notes (Signed)
02/01/2014 Justin Orozco [facility]   [birth year]  [id number]  Primary Physician [name],[name], MD Primary Cardiologist: Lorretta Harp MD Renae Gloss   HPI:  The patient is a delightful 80 year old, thin appearing, widowed Caucasian male, father of 2, grandfather to 4 grandchildren, a patient of Dr. Tamera Punt, whom I last saw in the office a year ago. He has a history of paroxysmal atrial flutter in the past. He has had a negative Myoview and a normal 2D echo.he was admitted for symptomatic bradycardia and complete heart block with ventricular response of 30 on March 12. He underwent insertion of a permanent transvenous pacemaker by Dr. [name] with improvement in his symptoms. He was complaining of shortness of breath prior to this with lower extremity edema but denied chest pain.I saw him in the office approximately 6 months ago and Dr. Rayann Heman  saw him just one month ago. He is completely asymptomatic.Dr. Rayann Heman did interrogate his pacemaker and found a short run of paroxysmal atrial flutter lasting less than 30 minutes.   85 Current Outpatient Prescriptions  Medication Sig Dispense Refill  . aspirin EC 81 MG tablet Take 81 mg by mouth daily.      . hydrALAZINE (APRESOLINE) 10 MG tablet Take 1 tablet by mouth daily.      . hydrochlorothiazide (MICROZIDE) 12.5 MG capsule Take 1 capsule (12.5 mg total) by mouth daily.  90 capsule  3  . metoprolol (LOPRESSOR) 50 MG tablet TAKE ONE-HALF TABLET BY MOUTH TWICE DAILY  30 tablet  6  . potassium chloride (K-DUR) 10 MEQ tablet Take 1 tablet (10 mEq total) by mouth daily.  90 tablet  3   No current facility-administered medications for this visit.    No Known Allergies  History   Social History  . Marital Status: Widowed    Spouse Name: N/A    Number of Children: 2  . Years of Education: N/A   Occupational History  . Not on file.   Social History Main Topics  . Smoking status: Passive Smoke Exposure - Never Smoker    . Smokeless tobacco: Not on file  . Alcohol Use: 2.3 oz/week    3 Glasses of wine, 1 Drinks containing 0.5 oz of alcohol per week  . Drug Use: No  . Sexual Activity: No   Other Topics Concern  . Not on file   Social History Narrative  . No narrative on file     Review of Systems: General: negative for chills, fever, night sweats or weight changes.  Cardiovascular: negative for chest pain, dyspnea on exertion, edema, orthopnea, palpitations, paroxysmal nocturnal dyspnea or shortness of breath Dermatological: negative for rash Respiratory: negative for cough or wheezing Urologic: negative for hematuria Abdominal: negative for nausea, vomiting, diarrhea, bright red blood per rectum, melena, or hematemesis Neurologic: negative for visual changes, syncope, or dizziness All other systems reviewed and are otherwise negative except as noted above.    Blood pressure 108/70, pulse 100, height 5' 9.25" (1.759 m), weight 133 lb (60.328 kg).  General appearance: alert and no distress Neck: no adenopathy, no carotid bruit, no JVD, supple, symmetrical, trachea midline and thyroid not enlarged, symmetric, no tenderness/mass/nodules Lungs: clear to auscultation bilaterally Heart: regular rate and rhythm, S1, S2 normal, no murmur, click, rub or gallop Extremities: extremities normal, atraumatic, no cyanosis or edema  EKG not performed today  ASSESSMENT AND PLAN:   Complete heart block His pacemaker insertion by Dr. [name] who follows him as an  outpatient. He was recently interrogated last month and the pacer was found to be functioning normally. There was a short run of paroxysmal atrial flutter lasting less than 30 minutes.  Lower extremity edema On low-dose diuretic, no longer an issue      Lorretta Harp MD [facility], [facility] 02/01/2014 3:00 PM

## 2014-02-01 NOTE — Assessment & Plan Note (Signed)
His pacemaker insertion by Dr. [name] who follows him as an outpatient. He was recently interrogated last month and the pacer was found to be functioning normally. There was a short run of paroxysmal atrial flutter lasting less than 30 minutes.

## 2014-02-01 NOTE — Assessment & Plan Note (Signed)
On low-dose diuretic, no longer an issue

## 2014-02-01 NOTE — Patient Instructions (Signed)
Your physician wants you to follow-up in: 1 year with Dr Berry. You will receive a reminder letter in the mail two months in advance. If you don't receive a letter, please call our office to schedule the follow-up appointment.  

## 2014-02-03 ENCOUNTER — Encounter: Payer: Self-pay | Admitting: Internal Medicine

## 2014-02-26 DIAGNOSIS — Z23 Encounter for immunization: Secondary | ICD-10-CM | POA: Diagnosis not present

## 2014-03-09 ENCOUNTER — Other Ambulatory Visit: Payer: Self-pay | Admitting: Dermatology

## 2014-03-09 DIAGNOSIS — Z85828 Personal history of other malignant neoplasm of skin: Secondary | ICD-10-CM | POA: Diagnosis not present

## 2014-03-09 DIAGNOSIS — L259 Unspecified contact dermatitis, unspecified cause: Secondary | ICD-10-CM | POA: Diagnosis not present

## 2014-03-09 DIAGNOSIS — L570 Actinic keratosis: Secondary | ICD-10-CM | POA: Diagnosis not present

## 2014-03-09 DIAGNOSIS — C44621 Squamous cell carcinoma of skin of unspecified upper limb, including shoulder: Secondary | ICD-10-CM | POA: Diagnosis not present

## 2014-04-30 ENCOUNTER — Other Ambulatory Visit: Payer: Self-pay | Admitting: Dermatology

## 2014-04-30 DIAGNOSIS — C4442 Squamous cell carcinoma of skin of scalp and neck: Secondary | ICD-10-CM | POA: Diagnosis not present

## 2014-04-30 DIAGNOSIS — Z85828 Personal history of other malignant neoplasm of skin: Secondary | ICD-10-CM | POA: Diagnosis not present

## 2014-04-30 DIAGNOSIS — C44329 Squamous cell carcinoma of skin of other parts of face: Secondary | ICD-10-CM | POA: Diagnosis not present

## 2014-05-20 ENCOUNTER — Encounter (HOSPITAL_COMMUNITY): Payer: Self-pay | Admitting: Internal Medicine

## 2014-05-20 DIAGNOSIS — M5124 Other intervertebral disc displacement, thoracic region: Secondary | ICD-10-CM | POA: Diagnosis not present

## 2014-05-20 DIAGNOSIS — M5408 Panniculitis affecting regions of neck and back, sacral and sacrococcygeal region: Secondary | ICD-10-CM | POA: Diagnosis not present

## 2014-05-20 DIAGNOSIS — M9903 Segmental and somatic dysfunction of lumbar region: Secondary | ICD-10-CM | POA: Diagnosis not present

## 2014-05-20 DIAGNOSIS — M9905 Segmental and somatic dysfunction of pelvic region: Secondary | ICD-10-CM | POA: Diagnosis not present

## 2014-05-20 DIAGNOSIS — S39012A Strain of muscle, fascia and tendon of lower back, initial encounter: Secondary | ICD-10-CM | POA: Diagnosis not present

## 2014-05-20 DIAGNOSIS — M9902 Segmental and somatic dysfunction of thoracic region: Secondary | ICD-10-CM | POA: Diagnosis not present

## 2014-05-25 DIAGNOSIS — M9905 Segmental and somatic dysfunction of pelvic region: Secondary | ICD-10-CM | POA: Diagnosis not present

## 2014-05-25 DIAGNOSIS — S39012A Strain of muscle, fascia and tendon of lower back, initial encounter: Secondary | ICD-10-CM | POA: Diagnosis not present

## 2014-05-25 DIAGNOSIS — M9903 Segmental and somatic dysfunction of lumbar region: Secondary | ICD-10-CM | POA: Diagnosis not present

## 2014-05-25 DIAGNOSIS — M5408 Panniculitis affecting regions of neck and back, sacral and sacrococcygeal region: Secondary | ICD-10-CM | POA: Diagnosis not present

## 2014-05-25 DIAGNOSIS — M9902 Segmental and somatic dysfunction of thoracic region: Secondary | ICD-10-CM | POA: Diagnosis not present

## 2014-05-25 DIAGNOSIS — M5124 Other intervertebral disc displacement, thoracic region: Secondary | ICD-10-CM | POA: Diagnosis not present

## 2014-05-31 ENCOUNTER — Other Ambulatory Visit: Payer: Self-pay | Admitting: Physician Assistant

## 2014-05-31 ENCOUNTER — Inpatient Hospital Stay (HOSPITAL_COMMUNITY): Payer: Medicare Other

## 2014-05-31 ENCOUNTER — Inpatient Hospital Stay (HOSPITAL_COMMUNITY)
Admission: AD | Admit: 2014-05-31 | Discharge: 2014-06-07 | DRG: 470 | Disposition: A | Discharge status: Skilled Nursing Facility | Payer: Medicare Other | Source: Ambulatory Visit | Attending: Internal Medicine | Admitting: Internal Medicine

## 2014-05-31 ENCOUNTER — Encounter (HOSPITAL_COMMUNITY): Payer: Self-pay | Admitting: General Practice

## 2014-05-31 DIAGNOSIS — S72002A Fracture of unspecified part of neck of left femur, initial encounter for closed fracture: Principal | ICD-10-CM | POA: Diagnosis present

## 2014-05-31 DIAGNOSIS — Z8679 Personal history of other diseases of the circulatory system: Secondary | ICD-10-CM | POA: Diagnosis not present

## 2014-05-31 DIAGNOSIS — Z8546 Personal history of malignant neoplasm of prostate: Secondary | ICD-10-CM

## 2014-05-31 DIAGNOSIS — Z01818 Encounter for other preprocedural examination: Secondary | ICD-10-CM

## 2014-05-31 DIAGNOSIS — I4891 Unspecified atrial fibrillation: Secondary | ICD-10-CM | POA: Diagnosis not present

## 2014-05-31 DIAGNOSIS — Z720 Tobacco use: Secondary | ICD-10-CM

## 2014-05-31 DIAGNOSIS — R278 Other lack of coordination: Secondary | ICD-10-CM | POA: Diagnosis not present

## 2014-05-31 DIAGNOSIS — Z96641 Presence of right artificial hip joint: Secondary | ICD-10-CM | POA: Diagnosis present

## 2014-05-31 DIAGNOSIS — R600 Localized edema: Secondary | ICD-10-CM | POA: Diagnosis present

## 2014-05-31 DIAGNOSIS — M1990 Unspecified osteoarthritis, unspecified site: Secondary | ICD-10-CM | POA: Diagnosis not present

## 2014-05-31 DIAGNOSIS — M6281 Muscle weakness (generalized): Secondary | ICD-10-CM | POA: Diagnosis not present

## 2014-05-31 DIAGNOSIS — R531 Weakness: Secondary | ICD-10-CM | POA: Diagnosis not present

## 2014-05-31 DIAGNOSIS — Z7982 Long term (current) use of aspirin: Secondary | ICD-10-CM

## 2014-05-31 DIAGNOSIS — I110 Hypertensive heart disease with heart failure: Secondary | ICD-10-CM | POA: Diagnosis not present

## 2014-05-31 DIAGNOSIS — M7989 Other specified soft tissue disorders: Secondary | ICD-10-CM | POA: Diagnosis not present

## 2014-05-31 DIAGNOSIS — S72042A Displaced fracture of base of neck of left femur, initial encounter for closed fracture: Secondary | ICD-10-CM | POA: Diagnosis not present

## 2014-05-31 DIAGNOSIS — Z923 Personal history of irradiation: Secondary | ICD-10-CM | POA: Diagnosis not present

## 2014-05-31 DIAGNOSIS — I4892 Unspecified atrial flutter: Secondary | ICD-10-CM | POA: Diagnosis present

## 2014-05-31 DIAGNOSIS — E871 Hypo-osmolality and hyponatremia: Secondary | ICD-10-CM | POA: Diagnosis not present

## 2014-05-31 DIAGNOSIS — S72002D Fracture of unspecified part of neck of left femur, subsequent encounter for closed fracture with routine healing: Secondary | ICD-10-CM

## 2014-05-31 DIAGNOSIS — S72002S Fracture of unspecified part of neck of left femur, sequela: Secondary | ICD-10-CM | POA: Diagnosis not present

## 2014-05-31 DIAGNOSIS — Z950 Presence of cardiac pacemaker: Secondary | ICD-10-CM | POA: Diagnosis not present

## 2014-05-31 DIAGNOSIS — D62 Acute posthemorrhagic anemia: Secondary | ICD-10-CM | POA: Diagnosis not present

## 2014-05-31 DIAGNOSIS — T148XXA Other injury of unspecified body region, initial encounter: Secondary | ICD-10-CM

## 2014-05-31 DIAGNOSIS — X58XXXA Exposure to other specified factors, initial encounter: Secondary | ICD-10-CM | POA: Diagnosis present

## 2014-05-31 DIAGNOSIS — K5900 Constipation, unspecified: Secondary | ICD-10-CM | POA: Diagnosis not present

## 2014-05-31 DIAGNOSIS — J984 Other disorders of lung: Secondary | ICD-10-CM | POA: Diagnosis not present

## 2014-05-31 DIAGNOSIS — Z9889 Other specified postprocedural states: Secondary | ICD-10-CM | POA: Diagnosis not present

## 2014-05-31 DIAGNOSIS — Z471 Aftercare following joint replacement surgery: Secondary | ICD-10-CM | POA: Diagnosis not present

## 2014-05-31 DIAGNOSIS — S72009A Fracture of unspecified part of neck of unspecified femur, initial encounter for closed fracture: Secondary | ICD-10-CM | POA: Diagnosis present

## 2014-05-31 DIAGNOSIS — I5042 Chronic combined systolic (congestive) and diastolic (congestive) heart failure: Secondary | ICD-10-CM | POA: Diagnosis present

## 2014-05-31 DIAGNOSIS — Z96642 Presence of left artificial hip joint: Secondary | ICD-10-CM | POA: Diagnosis not present

## 2014-05-31 DIAGNOSIS — R2681 Unsteadiness on feet: Secondary | ICD-10-CM | POA: Diagnosis not present

## 2014-05-31 DIAGNOSIS — M85852 Other specified disorders of bone density and structure, left thigh: Secondary | ICD-10-CM | POA: Diagnosis not present

## 2014-05-31 DIAGNOSIS — S72012A Unspecified intracapsular fracture of left femur, initial encounter for closed fracture: Secondary | ICD-10-CM | POA: Diagnosis not present

## 2014-05-31 DIAGNOSIS — I10 Essential (primary) hypertension: Secondary | ICD-10-CM | POA: Diagnosis not present

## 2014-05-31 DIAGNOSIS — M25552 Pain in left hip: Secondary | ICD-10-CM | POA: Diagnosis not present

## 2014-05-31 HISTORY — DX: Unspecified osteoarthritis, unspecified site: M19.90

## 2014-05-31 HISTORY — DX: Chronic combined systolic (congestive) and diastolic (congestive) heart failure: I50.42

## 2014-05-31 LAB — COMPREHENSIVE METABOLIC PANEL
ALBUMIN: 3.7 g/dL (ref 3.5–5.2)
ALK PHOS: 83 U/L (ref 39–117)
ALK PHOS: 70 U/L (ref 39–117)
ALT: 17 U/L (ref 0–53)
ALT: 13 U/L (ref 0–53)
ANION GAP: 12 (ref 5–15)
AST: 30 U/L (ref 0–37)
AST: 23 U/L (ref 0–37)
Albumin: 2.9 g/dL — ABNORMAL LOW (ref 3.5–5.2)
Anion gap: 8 (ref 5–15)
BILIRUBIN TOTAL: 0.8 mg/dL (ref 0.3–1.2)
BUN: 38 mg/dL — AB (ref 6–23)
BUN: 40 mg/dL — ABNORMAL HIGH (ref 6–23)
CALCIUM: 9.1 mg/dL (ref 8.4–10.5)
CHLORIDE: 102 meq/L (ref 96–112)
CO2: 27 mEq/L (ref 19–32)
CO2: 27 mEq/L (ref 19–32)
CREATININE: 1.2 mg/dL (ref 0.50–1.35)
Calcium: 9.8 mg/dL (ref 8.4–10.5)
Chloride: 105 mEq/L (ref 96–112)
Creatinine, Ser: 1.35 mg/dL (ref 0.50–1.35)
GFR calc Af Amer: 62 mL/min — ABNORMAL LOW (ref >90)
GFR calc Af Amer: 54 mL/min — ABNORMAL LOW (ref >90)
GFR calc non Af Amer: 53 mL/min — ABNORMAL LOW (ref >90)
GFR, EST NON AFRICAN AMERICAN: 46 mL/min — AB (ref >90)
GLUCOSE: 143 mg/dL — AB (ref 70–99)
Glucose, Bld: 116 mg/dL — ABNORMAL HIGH (ref 70–99)
POTASSIUM: 3.6 meq/L — AB (ref 3.7–5.3)
Potassium: 3.7 mEq/L (ref 3.7–5.3)
SODIUM: 140 meq/L (ref 137–147)
Sodium: 141 mEq/L (ref 137–147)
TOTAL PROTEIN: 6.7 g/dL (ref 6.0–8.3)
Total Bilirubin: 0.4 mg/dL (ref 0.3–1.2)
Total Protein: 5.5 g/dL — ABNORMAL LOW (ref 6.0–8.3)

## 2014-05-31 LAB — CBC WITH DIFFERENTIAL/PLATELET
BASOS ABS: 0 10*3/uL (ref 0.0–0.1)
Basophils Relative: 0 % (ref 0–1)
EOS ABS: 0.1 10*3/uL (ref 0.0–0.7)
Eosinophils Relative: 2 % (ref 0–5)
HCT: 40.4 % (ref 39.0–52.0)
Hemoglobin: 13.3 g/dL (ref 13.0–17.0)
Lymphocytes Relative: 17 % (ref 12–46)
Lymphs Abs: 1.1 10*3/uL (ref 0.7–4.0)
MCH: 30 pg (ref 26.0–34.0)
MCHC: 32.9 g/dL (ref 30.0–36.0)
MCV: 91 fL (ref 78.0–100.0)
MONOS PCT: 10 % (ref 3–12)
Monocytes Absolute: 0.7 10*3/uL (ref 0.1–1.0)
NEUTROS ABS: 4.7 10*3/uL (ref 1.7–7.7)
NEUTROS PCT: 71 % (ref 43–77)
Platelets: 224 10*3/uL (ref 150–400)
RBC: 4.44 MIL/uL (ref 4.22–5.81)
RDW: 13.9 % (ref 11.5–15.5)
WBC: 6.6 10*3/uL (ref 4.0–10.5)

## 2014-05-31 LAB — URINALYSIS, ROUTINE W REFLEX MICROSCOPIC
Bilirubin Urine: NEGATIVE
Glucose, UA: NEGATIVE mg/dL
HGB URINE DIPSTICK: NEGATIVE
Ketones, ur: NEGATIVE mg/dL
Leukocytes, UA: NEGATIVE
Nitrite: NEGATIVE
PROTEIN: NEGATIVE mg/dL
Specific Gravity, Urine: 1.024 (ref 1.005–1.030)
UROBILINOGEN UA: 0.2 mg/dL (ref 0.0–1.0)
pH: 5 (ref 5.0–8.0)

## 2014-05-31 LAB — SURGICAL PCR SCREEN
MRSA, PCR: NEGATIVE
Staphylococcus aureus: NEGATIVE

## 2014-05-31 LAB — APTT: aPTT: 30 seconds (ref 24–37)

## 2014-05-31 LAB — TYPE AND SCREEN
ABO/RH(D): A POS
Antibody Screen: NEGATIVE

## 2014-05-31 MED ORDER — HYDROCODONE-ACETAMINOPHEN 5-325 MG PO TABS
1.0000 | ORAL_TABLET | Freq: Four times a day (QID) | ORAL | Status: DC | PRN
  Administered 2014-06-01 – 2014-06-02 (×2): 1 via ORAL
  Administered 2014-06-02 – 2014-06-05 (×5): 2 via ORAL
  Administered 2014-06-06: 1 via ORAL
  Filled 2014-05-31: qty 2
  Filled 2014-05-31 (×2): qty 1
  Filled 2014-05-31: qty 2
  Filled 2014-05-31: qty 1
  Filled 2014-05-31 (×2): qty 2
  Filled 2014-05-31: qty 1
  Filled 2014-05-31: qty 2

## 2014-05-31 MED ORDER — CEFAZOLIN SODIUM-DEXTROSE 2-3 GM-% IV SOLR
2.0000 g | INTRAVENOUS | Status: AC
  Administered 2014-06-01: 2 g via INTRAVENOUS
  Filled 2014-05-31: qty 50

## 2014-05-31 MED ORDER — POTASSIUM CHLORIDE ER 10 MEQ PO TBCR
10.0000 meq | EXTENDED_RELEASE_TABLET | Freq: Every day | ORAL | Status: DC
  Administered 2014-05-31 – 2014-06-07 (×7): 10 meq via ORAL
  Filled 2014-05-31 (×8): qty 1

## 2014-05-31 MED ORDER — DEXTROSE-NACL 5-0.45 % IV SOLN
100.0000 mL/h | INTRAVENOUS | Status: DC
  Administered 2014-05-31: 100 mL/h via INTRAVENOUS

## 2014-05-31 MED ORDER — SENNOSIDES-DOCUSATE SODIUM 8.6-50 MG PO TABS
1.0000 | ORAL_TABLET | Freq: Every evening | ORAL | Status: DC | PRN
  Filled 2014-05-31: qty 1

## 2014-05-31 MED ORDER — METOPROLOL TARTRATE 50 MG PO TABS
50.0000 mg | ORAL_TABLET | Freq: Every day | ORAL | Status: DC
  Administered 2014-06-02 – 2014-06-07 (×6): 50 mg via ORAL
  Filled 2014-05-31 (×8): qty 1

## 2014-05-31 MED ORDER — CHLORHEXIDINE GLUCONATE 4 % EX LIQD
60.0000 mL | Freq: Once | CUTANEOUS | Status: AC
  Administered 2014-06-01: 4 via TOPICAL
  Filled 2014-05-31: qty 60

## 2014-05-31 MED ORDER — ASPIRIN EC 81 MG PO TBEC
81.0000 mg | DELAYED_RELEASE_TABLET | Freq: Every day | ORAL | Status: DC
  Filled 2014-05-31: qty 1

## 2014-05-31 MED ORDER — HYDROCHLOROTHIAZIDE 12.5 MG PO CAPS
12.5000 mg | ORAL_CAPSULE | Freq: Every day | ORAL | Status: DC
  Administered 2014-06-02: 12.5 mg via ORAL
  Filled 2014-05-31 (×3): qty 1

## 2014-05-31 MED ORDER — FLEET ENEMA 7-19 GM/118ML RE ENEM
1.0000 | ENEMA | Freq: Once | RECTAL | Status: AC | PRN
  Filled 2014-05-31: qty 1

## 2014-05-31 MED ORDER — SODIUM CHLORIDE 0.9 % IV SOLN
INTRAVENOUS | Status: DC
  Administered 2014-05-31: 17:00:00 via INTRAVENOUS

## 2014-05-31 MED ORDER — CEFAZOLIN SODIUM-DEXTROSE 2-3 GM-% IV SOLR
2.0000 g | INTRAVENOUS | Status: DC
  Filled 2014-05-31: qty 50

## 2014-05-31 MED ORDER — ACETAMINOPHEN 500 MG PO TABS
1000.0000 mg | ORAL_TABLET | Freq: Once | ORAL | Status: AC
  Administered 2014-05-31: 1000 mg via ORAL
  Filled 2014-05-31: qty 2

## 2014-05-31 MED ORDER — HYDRALAZINE HCL 10 MG PO TABS
10.0000 mg | ORAL_TABLET | Freq: Two times a day (BID) | ORAL | Status: DC
  Administered 2014-05-31 – 2014-06-07 (×12): 10 mg via ORAL
  Filled 2014-05-31 (×15): qty 1

## 2014-05-31 MED ORDER — BISACODYL 10 MG RE SUPP: 10.0000 mg | Freq: Every day | RECTAL | Status: DC | PRN

## 2014-05-31 MED ORDER — DOCUSATE SODIUM 100 MG PO CAPS
100.0000 mg | ORAL_CAPSULE | Freq: Two times a day (BID) | ORAL | Status: DC
  Administered 2014-05-31 – 2014-06-06 (×11): 100 mg via ORAL
  Filled 2014-05-31 (×15): qty 1

## 2014-05-31 MED ORDER — MORPHINE SULFATE 2 MG/ML IJ SOLN: 0.5000 mg | INTRAMUSCULAR | Status: DC | PRN

## 2014-05-31 NOTE — Progress Notes (Signed)
Orthopedic Tech Progress Note Patient Details:  Justin Orozco August 27, 1926 [id number]  Patient ID: Justin Orozco, male   DOB: March 30, 1927, 80 y.o.   MRN: [id number]  Pt unable to use trapeze bar patient helper; RN notified Hildred Priest 05/31/2014, 3:14 PM

## 2014-05-31 NOTE — H&P (Signed)
Justin Orozco is an 80 y.o. male.   Chief Complaint: left hip pain HPI: Patient was seen in out outpatient office today with left hip complaints x2 days without injury or trauma.  He has a history of right hip hemiarthroplasty performed by Dr. French Justin in 2012 for a fracture.  Community ambulator requiring a walker over the last 2 days.  Also currently being treated for LBP by chiropractor.  Lives alone here with family members today.  Recent history of pacemaker placement in March of this year.  Xrays of the pelvis and left hip taken today show a displaced subcapital femoral neck fracture of the left hip closed.    Past Medical History  Diagnosis Date  . Atrial flutter   . BPH (benign prostatic hyperplasia)   . Complete heart block 08/2013    s/p STJ dual chamber pacemaker implant  . Lower extremity edema   . Paroxysmal atrial flutter   . Lower extremity edema   . CHF (congestive heart failure)   . Prostate cancer     radiation    Past Surgical History  Procedure Laterality Date  . Tonsillectomy      "when i was 6 or 80 years old"  . Total hip arthroplasty Right 08/2010    Dr. French Justin  . Pacemaker insertion  08/21/2013    STJ Assurity dual chamber pacemaker implanted by Dr Rayann Heman for complete heart block  . Transthoracic echocardiogram  08/2010    EF 55-60%, mod conc hypertrophy, grade 1 diastolic dysfunction, AV sclerosis, mild regurg; RA mildly dilated  . Nm myocar perf wall motion  07/2010    bruce myoview - perfusion defect in basal-apical inferior region (more at rest than stress) - non-gated (A-Flutter), low risk  . Permanent pacemaker insertion N/A 08/21/2013    Procedure: PERMANENT PACEMAKER INSERTION;  Surgeon: Justin Mark, MD;  Location: [place] CATH LAB;  Service: Cardiovascular;  Laterality: N/A;    No family history on file. Social History:  reports that he has been passively smoking.  He does not have any smokeless tobacco history on file. He reports that he drinks about 2.3  oz of alcohol per week. He reports that he does not use illicit drugs.  Allergies: No Known Allergies   (Not in a hospital admission)  No results found for this or any previous visit (from the past 48 hour(s)). No results found.  Review of Systems  Constitutional: Negative.   HENT: Negative.   Eyes: Negative.   Respiratory: Negative.   Cardiovascular: Negative.   Gastrointestinal: Negative.   Genitourinary: Negative.   Musculoskeletal: Positive for back pain and joint pain. Negative for falls.  Skin: Negative.   Neurological: Negative.   Endo/Heme/Allergies: Negative.   Psychiatric/Behavioral: Negative.     There were no vitals taken for this visit. Physical Exam  Constitutional: He is oriented to person, place, and time. He appears well-developed. No distress.  HENT:  Head: Normocephalic and atraumatic.  Nose: Nose normal.  Eyes: Conjunctivae and EOM are normal. Pupils are equal, round, and reactive to light.  Neck: Normal range of motion. Neck supple.  Cardiovascular: Normal rate and intact distal pulses.   Respiratory: Effort normal. No respiratory distress. He has no wheezes.  GI: Soft. He exhibits no distension. There is no tenderness.  Musculoskeletal:       Left hip: He exhibits decreased range of motion, tenderness and bony tenderness.  Lymphadenopathy:    He has no cervical adenopathy.  Neurological: He is alert and  oriented to person, place, and time. No cranial nerve deficit.  Skin: Skin is warm and dry. No rash noted. No erythema.  Psychiatric: He has a normal mood and affect. His behavior is normal.     Assessment/Plan Left hip fracture  Discussed risks and benefits of Left hip hemiarthroplasty and patient wishes to proceed.  He will be direct admitted to Justin Orozco to medical service for clearance prior to surgery on telemetry bed for surgery tomorrow with Dr. Edmonia Orozco.  NPO after midnight.  Likely 3-4 day hospital stay.  Believe he was able to d/c  with HHPT following his right hip surgery but will assess with PT following surgery.      Justin Orozco 05/31/2014, 2:13 PM

## 2014-05-31 NOTE — Anesthesia Preprocedure Evaluation (Addendum)
Anesthesia Evaluation  Patient identified by MRN, date of birth, ID band Patient awake    Reviewed: Allergy & Precautions, H&P , NPO status , Patient's Chart, lab work & pertinent test results, reviewed documented beta blocker date and time   Airway Mallampati: II   Neck ROM: Full    Dental  (+) Missing   Pulmonary  breath sounds clear to auscultation        Cardiovascular +CHF + dysrhythmias + pacemaker Rhythm:Irregular  ECHO 08/2013 EF 40-45%   Neuro/Psych    GI/Hepatic negative GI ROS, Neg liver ROS,   Endo/Other    Renal/GU GFR 60     Musculoskeletal   Abdominal (+)  Abdomen: soft.    Peds  Hematology   Anesthesia Other Findings   Reproductive/Obstetrics                           Anesthesia Physical Anesthesia Plan  ASA: III  Anesthesia Plan: General   Post-op Pain Management:    Induction: Intravenous  Airway Management Planned: Oral ETT  Additional Equipment:   Intra-op Plan:   Post-operative Plan: Extubation in OR  Informed Consent: I have reviewed the patients History and Physical, chart, labs and discussed the procedure including the risks, benefits and alternatives for the proposed anesthesia with the patient or authorized representative who has indicated his/her understanding and acceptance.     Plan Discussed with:   Anesthesia Plan Comments:         Anesthesia Quick Evaluation

## 2014-05-31 NOTE — H&P (Signed)
[facility] History and Physical  Justin Orozco GXQ:[id number] DOB: Mar 20, 1927 DOA: 05/31/2014  Referring physician: [name] PCP: Geoffery Lyons, MD  Specialists: Dr. [name], cardiologist  Chief Complaint: Left hip pain  HPI: Justin Orozco is a 80 y.o. male  With a history of complete heart block status post pacemaker placement in March 2015, paroxysmal atrial flutter, heart failure, but presented as a direct admission from [name]'s office. Patient was seen today in the outpatient setting, as he complained of left hip pain for approximately 2 days. Patient denied any trauma, injury, fall. Patient does have a history of right hip fracture with heme arthroplasty in 2012. Patient does use a walker to aid in ambulation. Patient does state he had pacemaker placed in March 2015, and follows up with Dr. [name].  Patient denies any chest pain, shortness of breath, recent illness or travel upon admission.  Review of Systems:  Constitutional: Denies fever, chills, diaphoresis, appetite change and fatigue.  HEENT: Denies photophobia, eye pain, redness, hearing loss, ear pain, congestion, sore throat, rhinorrhea, sneezing, mouth sores, trouble swallowing, neck pain, neck stiffness and tinnitus.   Respiratory: Denies SOB, DOE, cough, chest tightness,  and wheezing.   Cardiovascular: Denies chest pain, palpitations and leg swelling.  Gastrointestinal: Denies nausea, vomiting, abdominal pain, diarrhea, constipation, blood in stool and abdominal distention.  Genitourinary: Denies dysuria, urgency, frequency, hematuria, flank pain and difficulty urinating.  Musculoskeletal: Complains of hip pain and back pain.  Skin: Denies pallor, rash and wound.  Neurological: Denies dizziness, seizures, syncope, weakness, light-headedness, numbness and headaches.  Hematological: Denies adenopathy. Easy bruising, personal or family bleeding history  Psychiatric/Behavioral: Denies suicidal  ideation, mood changes, confusion, nervousness, sleep disturbance and agitation  Past Medical History  Diagnosis Date  . Atrial flutter   . BPH (benign prostatic hyperplasia)   . Complete heart block 08/2013    s/p STJ dual chamber pacemaker implant  . Paroxysmal atrial flutter   . Lower extremity edema   . CHF (congestive heart failure)   . Presence of permanent cardiac pacemaker   . Arthritis     "nothing serious" (05/31/2014)  . Prostate cancer     radiation   Past Surgical History  Procedure Laterality Date  . Total hip arthroplasty Right 08/2010    Dr. French Ana  . Transthoracic echocardiogram  08/2010    EF 55-60%, mod conc hypertrophy, grade 1 diastolic dysfunction, AV sclerosis, mild regurg; RA mildly dilated  . Nm myocar perf wall motion  07/2010    bruce myoview - perfusion defect in basal-apical inferior region (more at rest than stress) - non-gated (A-Flutter), low risk  . Permanent pacemaker insertion N/A 08/21/2013    Procedure: PERMANENT PACEMAKER INSERTION;  Surgeon: Coralyn Mark, MD;  Location: [facility];  Service: Cardiovascular;  Laterality: N/A;  . Insert / replace / remove pacemaker    . Tonsillectomy  ~ 1935  . Anterior cruciate ligament repair Right ~ 1950   Social History:  reports that he has been passively smoking.  He has never used smokeless tobacco. He reports that he drinks about 2.3 oz of alcohol per week. He reports that he does not use illicit drugs. With the home alone. Has good family support.  No Known Allergies  History reviewed. No pertinent family history.   Prior to Admission medications   Medication Sig Start Date End Date Taking? Authorizing Provider  aspirin EC 81 MG tablet Take 81 mg by mouth daily.   Yes  Historical Provider, MD  hydrALAZINE (APRESOLINE) 10 MG tablet Take 1 tablet by mouth 2 (two) times daily.  12/21/13  Yes Historical Provider, MD  hydrochlorothiazide (MICROZIDE) 12.5 MG capsule Take 1 capsule (12.5 mg total) by mouth  daily. 08/26/13  Yes Lorretta Harp, MD  metoprolol (LOPRESSOR) 50 MG tablet TAKE ONE-HALF TABLET BY MOUTH TWICE DAILY Patient taking differently: TAKE one TABLET BY MOUTH once DAILY   Yes Lorretta Harp, MD  potassium chloride (K-DUR) 10 MEQ tablet Take 1 tablet (10 mEq total) by mouth daily. 08/26/13  Yes Lorretta Harp, MD   Physical Exam: Filed Vitals:   05/31/14 1500  BP: 140/63  Pulse: 91  Temp: 98.1 F (36.7 C)  Resp: 18     General: Well developed, well nourished, NAD, appears stated age  HEENT: NCAT, PERRLA, EOMI, Anicteic Sclera, mucous membranes moist.   Neck: Supple, no JVD, no masses  Cardiovascular: S1 S2 auscultated, no rubs, murmurs or gallops. Regular rate and rhythm.  Respiratory: Clear to auscultation bilaterally with equal chest rise  Abdomen: Soft, nontender, nondistended, + bowel sounds  Extremities: warm dry without cyanosis clubbing. Trace edema in LE Bilaterally.   Pain with palpation of Left hip.  Neuro: AAOx3, cranial nerves grossly intact. Strength equal bilaterally in upper extremities, difficult to test left lower extremity due to pain, decreased range of motion of LLE  Psych: Normal affect and demeanor with intact judgement and insight  Labs on Admission:  Basic Metabolic Panel: No results for input(s): NA, K, CL, CO2, GLUCOSE, BUN, CREATININE, CALCIUM, MG, PHOS in the last 168 hours. Liver Function Tests: No results for input(s): AST, ALT, ALKPHOS, BILITOT, PROT, ALBUMIN in the last 168 hours. No results for input(s): LIPASE, AMYLASE in the last 168 hours. No results for input(s): AMMONIA in the last 168 hours. CBC:  Recent Labs Lab 05/31/14 1620  WBC 6.6  NEUTROABS 4.7  HGB 13.3  HCT 40.4  MCV 91.0  PLT 224   Cardiac Enzymes: No results for input(s): CKTOTAL, CKMB, CKMBINDEX, TROPONINI in the last 168 hours.  BNP (last 3 results)  Recent Labs  08/21/13 0554  PROBNP 6238.0*   CBG: No results for input(s): GLUCAP in  the last 168 hours.  Radiological Exams on Admission: No results found.  EKG: None  Assessment/Plan  Left hip pain secondary to closed hip fracture -Patient directly admitted to telemetry from Dr. [name]'s office -Orthopedics aware of patient's arrival and admission -Tentative surgery 06/01/2014 -Patient is moderate risk secondary to his age as well as history of complete heart block however has pacemaker in place -Will obtain chest x-ray as well as EKG -Fracture likely pathologic -PT and OT will assess patient post surgery -Patient would like to return home post surgery with home health  Chronic combined systolic and diastolic heart failure -Echo in March 2015 shows EF of 44-92%, grade 1 diastolic dysfunction -Currently appears compensated -Continue home medications of metoprolol, HCTZ -Will monitor patient's intake and output as well as daily weights  History of paroxysmal atrial flutter -Currently rate and appears to be rhythm controlled -Continue metoprolol -Will obtain EKG  History of complete heart block -Patient has dual-chamber pacemaker, implanted 08/2013  History of prostate cancer -Patient did receive radiation  Chronic lower extremity edema -Per patient, appears to be at baseline  DVT prophylaxis: SCDs  Code Status: Full  Condition: Stable  Family Communication: Daughter at bedside. Admission, patients condition and plan of care including tests being ordered have been discussed  with the patient and daughter who indicate understanding and agree with the plan and Code Status.  Disposition Plan: Admitted, expected length of stay 3-4 days  Time spent: 60 minutes  Elesha Thedford D.O. [facility] Pager [phone]  If 7PM-7AM, please contact night-coverage [email or web address] Password [email or web address] 05/31/2014, 5:15 PM

## 2014-05-31 NOTE — Progress Notes (Signed)
INITIAL NUTRITION ASSESSMENT  DOCUMENTATION CODES Per approved criteria  -unable to assess   INTERVENTION: 1.  Supplements; Ensure Complete po BID, each supplement provides 350 kcal and 13 grams of protein 2.  General healthful diet; encourage intake of meals and beverages as appropriate.  Note patient is to be NPO after midnight tonight.  NUTRITION DIAGNOSIS: Increased nutrient needs related to healing as evidenced by hip fx.  Monitor:  1.  Food/Beverage; pt meeting >/=90% estimated needs with tolerance. 2.  Wt/wt change; monitor trends  Reason for Assessment: hip fx  80 y.o. male  Admitting Dx: hip pain  ASSESSMENT: Pt admitted with hip pain, found to have hip facture and direct admit from ortho office.  Pt is planning for OR tomorrow.  In with RN for pre-op assessment at time of visit.  RD unable to complete visit.   Note recent weight change from 150 lbs to 133 lbs over the past 6 months which warrants further investigation.   RD to follow. Have ordered Ensure for today (pre-op) and will plan to continue post-op if patient is agreeable.   Height: Ht Readings from Last 1 Encounters:  02/01/14 5' 9.25" (1.759 m)    Weight: Wt Readings from Last 1 Encounters:  02/01/14 133 lb (60.328 kg)    Ideal Body Weight: 160 lbs  % Ideal Body Weight: 83%  Wt Readings from Last 10 Encounters:  02/01/14 133 lb (60.328 kg)  01/01/14 135 lb 3.2 oz (61.326 kg)  10/09/13 134 lb (60.782 kg)  08/26/13 148 lb 4.8 oz (67.268 kg)  08/23/13 146 lb 3.2 oz (66.316 kg)    Usual Body Weight: 145-150 lbs  % Usual Body Weight: 89%  BMI:  There is no weight on file to calculate BMI.  Estimated Nutritional Needs: Kcal: 1600-1800 Protein: 70-85g Fluid: >1.8 L/day  Skin: intact  Diet Order: Diet NPO time specified  EDUCATION NEEDS: -Education not appropriate at this time  No intake or output data in the 24 hours ending 05/31/14 1622  Last BM: PTA   Labs:  No results for  input(s): NA, K, CL, CO2, BUN, CREATININE, CALCIUM, MG, PHOS, GLUCOSE in the last 168 hours.  CBG (last 3)  No results for input(s): GLUCAP in the last 72 hours.  Scheduled Meds: . [START ON 06/01/2014]  ceFAZolin (ANCEF) IV  2 g Intravenous On Call to OR  . docusate sodium  100 mg Oral BID    Continuous Infusions: . sodium chloride      Past Medical History  Diagnosis Date  . Atrial flutter   . BPH (benign prostatic hyperplasia)   . Complete heart block 08/2013    s/p STJ dual chamber pacemaker implant  . Paroxysmal atrial flutter   . Lower extremity edema   . CHF (congestive heart failure)   . Presence of permanent cardiac pacemaker   . Arthritis     "nothing serious" (05/31/2014)  . Prostate cancer     radiation    Past Surgical History  Procedure Laterality Date  . Total hip arthroplasty Right 08/2010    Dr. French Ana  . Transthoracic echocardiogram  08/2010    EF 55-60%, mod conc hypertrophy, grade 1 diastolic dysfunction, AV sclerosis, mild regurg; RA mildly dilated  . Nm myocar perf wall motion  07/2010    bruce myoview - perfusion defect in basal-apical inferior region (more at rest than stress) - non-gated (A-Flutter), low risk  . Permanent pacemaker insertion N/A 08/21/2013    Procedure: PERMANENT PACEMAKER INSERTION;  Surgeon: Coralyn Mark, MD;  Location: [facility] CATH [facility];  Service: Cardiovascular;  Laterality: N/A;  . Insert / replace / remove pacemaker    . Tonsillectomy  ~ 1935  . Anterior cruciate ligament repair Right ~ [place], MS RD LDN Clinical Inpatient Dietitian Weekend/After hours pager: [phone]

## 2014-06-01 ENCOUNTER — Inpatient Hospital Stay (HOSPITAL_COMMUNITY): Payer: Medicare Other

## 2014-06-01 ENCOUNTER — Inpatient Hospital Stay (HOSPITAL_COMMUNITY): Payer: Medicare Other | Admitting: Anesthesiology

## 2014-06-01 ENCOUNTER — Encounter (HOSPITAL_COMMUNITY)
Admission: AD | Disposition: A | Discharge status: Skilled Nursing Facility | Payer: Self-pay | Source: Ambulatory Visit | Attending: Internal Medicine

## 2014-06-01 ENCOUNTER — Encounter (HOSPITAL_COMMUNITY): Payer: Self-pay | Admitting: Anesthesiology

## 2014-06-01 DIAGNOSIS — S72002D Fracture of unspecified part of neck of left femur, subsequent encounter for closed fracture with routine healing: Secondary | ICD-10-CM

## 2014-06-01 HISTORY — PX: HIP ARTHROPLASTY: SHX981

## 2014-06-01 SURGERY — HEMIARTHROPLASTY, HIP, DIRECT ANTERIOR APPROACH, FOR FRACTURE
Anesthesia: General | Site: Hip | Laterality: Left

## 2014-06-01 MED ORDER — ENSURE COMPLETE PO LIQD
237.0000 mL | Freq: Two times a day (BID) | ORAL | Status: DC
  Administered 2014-06-02 – 2014-06-07 (×8): 237 mL via ORAL

## 2014-06-01 MED ORDER — FENTANYL CITRATE 0.05 MG/ML IJ SOLN
INTRAMUSCULAR | Status: AC
  Filled 2014-06-01: qty 5

## 2014-06-01 MED ORDER — ROCURONIUM BROMIDE 100 MG/10ML IV SOLN
INTRAVENOUS | Status: DC | PRN
Start: 2014-06-01 — End: 2014-06-01
  Administered 2014-06-01: 30 mg via INTRAVENOUS

## 2014-06-01 MED ORDER — ASPIRIN EC 325 MG PO TBEC
325.0000 mg | DELAYED_RELEASE_TABLET | Freq: Every day | ORAL | Status: DC
  Administered 2014-06-02 – 2014-06-07 (×6): 325 mg via ORAL
  Filled 2014-06-01 (×7): qty 1

## 2014-06-01 MED ORDER — CEFAZOLIN SODIUM-DEXTROSE 2-3 GM-% IV SOLR
2.0000 g | Freq: Four times a day (QID) | INTRAVENOUS | Status: AC
  Administered 2014-06-01 (×2): 2 g via INTRAVENOUS
  Filled 2014-06-01 (×2): qty 50

## 2014-06-01 MED ORDER — LIDOCAINE HCL (CARDIAC) 20 MG/ML IV SOLN
INTRAVENOUS | Status: AC
  Filled 2014-06-01: qty 5

## 2014-06-01 MED ORDER — ONDANSETRON HCL 4 MG/2ML IJ SOLN
INTRAMUSCULAR | Status: DC | PRN
  Administered 2014-06-01: 4 mg via INTRAVENOUS

## 2014-06-01 MED ORDER — PHENYLEPHRINE HCL 10 MG/ML IJ SOLN: INTRAMUSCULAR | Status: DC | PRN

## 2014-06-01 MED ORDER — EPHEDRINE SULFATE 50 MG/ML IJ SOLN
INTRAMUSCULAR | Status: AC
  Filled 2014-06-01: qty 1

## 2014-06-01 MED ORDER — ARTIFICIAL TEARS OP OINT
TOPICAL_OINTMENT | OPHTHALMIC | Status: DC | PRN
  Administered 2014-06-01: 1 via OPHTHALMIC

## 2014-06-01 MED ORDER — NEOSTIGMINE METHYLSULFATE 10 MG/10ML IV SOLN
INTRAVENOUS | Status: DC | PRN
Start: 2014-06-01 — End: 2014-06-01
  Administered 2014-06-01: 3 mg via INTRAVENOUS

## 2014-06-01 MED ORDER — BUPIVACAINE-EPINEPHRINE (PF) 0.5% -1:200000 IJ SOLN
INTRAMUSCULAR | Status: AC
  Filled 2014-06-01: qty 30

## 2014-06-01 MED ORDER — PROPOFOL 10 MG/ML IV BOLUS
INTRAVENOUS | Status: DC | PRN
  Administered 2014-06-01: 125 mg via INTRAVENOUS

## 2014-06-01 MED ORDER — NEOSTIGMINE METHYLSULFATE 10 MG/10ML IV SOLN
INTRAVENOUS | Status: AC
  Filled 2014-06-01: qty 1

## 2014-06-01 MED ORDER — ACETAMINOPHEN 650 MG RE SUPP: 650.0000 mg | Freq: Four times a day (QID) | RECTAL | Status: DC | PRN

## 2014-06-01 MED ORDER — GLYCOPYRROLATE 0.2 MG/ML IJ SOLN
INTRAMUSCULAR | Status: AC
  Filled 2014-06-01: qty 2

## 2014-06-01 MED ORDER — FENTANYL CITRATE 0.05 MG/ML IJ SOLN
INTRAMUSCULAR | Status: DC | PRN
  Administered 2014-06-01 (×3): 50 ug via INTRAVENOUS

## 2014-06-01 MED ORDER — MENTHOL 3 MG MT LOZG
1.0000 | LOZENGE | OROMUCOSAL | Status: DC | PRN
Start: 2014-06-01 — End: 2014-06-07

## 2014-06-01 MED ORDER — PHENOL 1.4 % MT LIQD: 1.0000 | OROMUCOSAL | Status: DC | PRN

## 2014-06-01 MED ORDER — LIDOCAINE HCL (CARDIAC) 20 MG/ML IV SOLN
INTRAVENOUS | Status: DC | PRN
  Administered 2014-06-01: 50 mg via INTRAVENOUS

## 2014-06-01 MED ORDER — ASPIRIN EC 325 MG PO TBEC: 325.0000 mg | DELAYED_RELEASE_TABLET | Freq: Every day | ORAL | Status: DC

## 2014-06-01 MED ORDER — DOCUSATE SODIUM 100 MG PO CAPS: 100.0000 mg | ORAL_CAPSULE | Freq: Two times a day (BID) | ORAL | Status: DC

## 2014-06-01 MED ORDER — FENTANYL CITRATE 0.05 MG/ML IJ SOLN
25.0000 ug | INTRAMUSCULAR | Status: DC | PRN
  Administered 2014-06-01: 25 ug via INTRAVENOUS

## 2014-06-01 MED ORDER — ROCURONIUM BROMIDE 50 MG/5ML IV SOLN
INTRAVENOUS | Status: AC
  Filled 2014-06-01: qty 1

## 2014-06-01 MED ORDER — PROPOFOL 10 MG/ML IV BOLUS
INTRAVENOUS | Status: AC
  Filled 2014-06-01: qty 20

## 2014-06-01 MED ORDER — LACTATED RINGERS IV SOLN
INTRAVENOUS | Status: DC | PRN
  Administered 2014-06-01 (×2): via INTRAVENOUS

## 2014-06-01 MED ORDER — SODIUM CHLORIDE 0.9 % IJ SOLN
INTRAMUSCULAR | Status: AC
  Filled 2014-06-01: qty 10

## 2014-06-01 MED ORDER — ACETAMINOPHEN 325 MG PO TABS: 650.0000 mg | ORAL_TABLET | Freq: Four times a day (QID) | ORAL | Status: DC | PRN

## 2014-06-01 MED ORDER — GLYCOPYRROLATE 0.2 MG/ML IJ SOLN
INTRAMUSCULAR | Status: DC | PRN
  Administered 2014-06-01: 0.4 mg via INTRAVENOUS

## 2014-06-01 MED ORDER — ARTIFICIAL TEARS OP OINT
TOPICAL_OINTMENT | OPHTHALMIC | Status: AC
  Filled 2014-06-01: qty 3.5

## 2014-06-01 MED ORDER — PHENYLEPHRINE HCL 10 MG/ML IJ SOLN
INTRAMUSCULAR | Status: DC | PRN
  Administered 2014-06-01 (×2): 40 ug via INTRAVENOUS
  Administered 2014-06-01: 80 ug via INTRAVENOUS

## 2014-06-01 MED ORDER — PHENYLEPHRINE 40 MCG/ML (10ML) SYRINGE FOR IV PUSH (FOR BLOOD PRESSURE SUPPORT)
PREFILLED_SYRINGE | INTRAVENOUS | Status: AC
  Filled 2014-06-01: qty 10

## 2014-06-01 MED ORDER — 0.9 % SODIUM CHLORIDE (POUR BTL) OPTIME
TOPICAL | Status: DC | PRN
  Administered 2014-06-01: 1000 mL

## 2014-06-01 MED ORDER — PROMETHAZINE HCL 25 MG/ML IJ SOLN
6.2500 mg | INTRAMUSCULAR | Status: DC | PRN
Start: 2014-06-01 — End: 2014-06-01

## 2014-06-01 MED ORDER — FENTANYL CITRATE 0.05 MG/ML IJ SOLN
INTRAMUSCULAR | Status: AC
  Filled 2014-06-01: qty 2

## 2014-06-01 MED ORDER — ONDANSETRON HCL 4 MG/2ML IJ SOLN
INTRAMUSCULAR | Status: AC
  Filled 2014-06-01: qty 2

## 2014-06-01 MED ORDER — HYDROCODONE-ACETAMINOPHEN 5-325 MG PO TABS: 1.0000 | ORAL_TABLET | ORAL | Status: DC | PRN

## 2014-06-01 MED ORDER — MEPERIDINE HCL 25 MG/ML IJ SOLN: 6.2500 mg | INTRAMUSCULAR | Status: DC | PRN

## 2014-06-01 SURGICAL SUPPLY — 51 items
APL SKNCLS STERI-STRIP NONHPOA (GAUZE/BANDAGES/DRESSINGS) ×1
BENZOIN TINCTURE PRP APPL 2/3 (GAUZE/BANDAGES/DRESSINGS) ×3 IMPLANT
BIT DRILL 7/64X5 DISP (BIT) ×3 IMPLANT
BLADE SAGITTAL 25.0X1.27X90 (BLADE) ×2 IMPLANT
BLADE SAGITTAL 25.0X1.27X90MM (BLADE) ×1
CAPT HIP HEMI 2 ×3 IMPLANT
CLOSURE STERI-STRIP 1/2X4 (GAUZE/BANDAGES/DRESSINGS) ×1
CLSR STERI-STRIP ANTIMIC 1/2X4 (GAUZE/BANDAGES/DRESSINGS) ×2 IMPLANT
COVER SURGICAL LIGHT HANDLE (MISCELLANEOUS) ×3 IMPLANT
DRAPE IMP U-DRAPE 54X76 (DRAPES) ×3 IMPLANT
DRAPE ORTHO SPLIT 77X108 STRL (DRAPES) ×6
DRAPE SURG ORHT 6 SPLT 77X108 (DRAPES) ×2 IMPLANT
DRAPE U-SHAPE 47X51 STRL (DRAPES) ×3 IMPLANT
DRSG AQUACEL AG ADV 3.5X10 (GAUZE/BANDAGES/DRESSINGS) ×3 IMPLANT
DRSG MEPILEX BORDER 4X8 (GAUZE/BANDAGES/DRESSINGS) ×1 IMPLANT
DURAPREP 26ML APPLICATOR (WOUND CARE) ×3 IMPLANT
ELECT CAUTERY BLADE 6.4 (BLADE) ×3 IMPLANT
ELECT REM PT RETURN 9FT ADLT (ELECTROSURGICAL) ×3
ELECTRODE REM PT RTRN 9FT ADLT (ELECTROSURGICAL) ×1 IMPLANT
FACESHIELD WRAPAROUND (MASK) ×3 IMPLANT
FACESHIELD WRAPAROUND OR TEAM (MASK) ×1 IMPLANT
GLOVE BIO SURGEON STRL SZ7.5 (GLOVE) IMPLANT
GLOVE BIOGEL PI IND STRL 8 (GLOVE) ×1 IMPLANT
GLOVE BIOGEL PI INDICATOR 8 (GLOVE) ×2
GOWN STRL REUS W/ TWL LRG LVL3 (GOWN DISPOSABLE) ×1 IMPLANT
GOWN STRL REUS W/ TWL XL LVL3 (GOWN DISPOSABLE) ×1 IMPLANT
GOWN STRL REUS W/TWL LRG LVL3 (GOWN DISPOSABLE) ×3
GOWN STRL REUS W/TWL XL LVL3 (GOWN DISPOSABLE) ×3
HIP CAPITATED HEMI 2 IMPLANT
KIT BASIN OR (CUSTOM PROCEDURE TRAY) ×3 IMPLANT
KIT ROOM TURNOVER OR (KITS) ×3 IMPLANT
MANIFOLD NEPTUNE II (INSTRUMENTS) ×3 IMPLANT
NS IRRIG 1000ML POUR BTL (IV SOLUTION) ×3 IMPLANT
PACK TOTAL JOINT (CUSTOM PROCEDURE TRAY) ×3 IMPLANT
PACK UNIVERSAL I (CUSTOM PROCEDURE TRAY) ×3 IMPLANT
PAD ARMBOARD 7.5X6 YLW CONV (MISCELLANEOUS) ×6 IMPLANT
PILLOW ABDUCTION HIP (SOFTGOODS) ×3 IMPLANT
RETRIEVER SUT HEWSON (MISCELLANEOUS) ×3 IMPLANT
SUT FIBERWIRE #2 38 REV NDL BL (SUTURE) ×6
SUT MNCRL AB 4-0 PS2 18 (SUTURE) ×3 IMPLANT
SUT MON AB 2-0 CT1 36 (SUTURE) ×3 IMPLANT
SUT VIC AB 0 CT1 27 (SUTURE) ×6
SUT VIC AB 0 CT1 27XBRD ANBCTR (SUTURE) ×1 IMPLANT
SUT VIC AB 2-0 CT1 27 (SUTURE) ×3
SUT VIC AB 2-0 CT1 TAPERPNT 27 (SUTURE) IMPLANT
SUTURE FIBERWR#2 38 REV NDL BL (SUTURE) ×2 IMPLANT
TOWEL OR 17X24 6PK STRL BLUE (TOWEL DISPOSABLE) ×3 IMPLANT
TOWEL OR 17X26 10 PK STRL BLUE (TOWEL DISPOSABLE) ×3 IMPLANT
TOWEL OR NON WOVEN STRL DISP B (DISPOSABLE) ×3 IMPLANT
TRAY FOLEY CATH 14FR (SET/KITS/TRAYS/PACK) IMPLANT
WATER STERILE IRR 1000ML POUR (IV SOLUTION) ×2 IMPLANT

## 2014-06-01 NOTE — Anesthesia Postprocedure Evaluation (Signed)
  Anesthesia Post-op Note  Patient: Justin Orozco  Procedure(s) Performed: Procedure(s) with comments: LEFT HIP HEMI ARTHROPLASTY  (Left) - Requesting Larkin Ina or April RNFA  Patient Location: PACU  Anesthesia Type:General  Level of Consciousness: awake and alert   Airway and Oxygen Therapy: Patient Spontanous Breathing  Post-op Pain: mild  Post-op Assessment: Post-op Vital signs reviewed, Patient's Cardiovascular Status Stable, Respiratory Function Stable, Patent Airway and No signs of Nausea or vomiting  Post-op Vital Signs: Reviewed and stable  Last Vitals:  Filed Vitals:   06/01/14 0816  BP: 134/56  Pulse: 60  Temp: 36.7 C  Resp:     Complications: No apparent anesthesia complications

## 2014-06-01 NOTE — Anesthesia Procedure Notes (Signed)
Procedure Name: Intubation Date/Time: 06/01/2014 10:21 AM Performed by: Scheryl Darter Pre-anesthesia Checklist: Patient identified, Emergency Drugs available, Suction available and Timeout performed Patient Re-evaluated:Patient Re-evaluated prior to inductionOxygen Delivery Method: Circle system utilized Preoxygenation: Pre-oxygenation with 100% oxygen Intubation Type: IV induction Ventilation: Mask ventilation without difficulty Laryngoscope Size: Mac and 4 Grade View: Grade I Tube type: Oral Tube size: 8.0 mm Number of attempts: 1 Airway Equipment and Method: Stylet Placement Confirmation: ETT inserted through vocal cords under direct vision,  positive ETCO2 and breath sounds checked- equal and bilateral Secured at: 23 cm Tube secured with: Tape Dental Injury: Teeth and Oropharynx as per pre-operative assessment

## 2014-06-01 NOTE — Progress Notes (Signed)
Utilization review completed.  

## 2014-06-01 NOTE — Op Note (Signed)
05/31/2014 - 06/01/2014  11:20 AM  PATIENT:  Justin Orozco   MRN: [id number]  PRE-OPERATIVE DIAGNOSIS:  LEFT HIP FRACTURE   POST-OPERATIVE DIAGNOSIS:  LEFT HIP FRACTURE   PROCEDURE:  Procedure(s): LEFT HIP HEMI ARTHROPLASTY   PREOPERATIVE INDICATIONS:  Justin Orozco is an 80 y.o. male who was admitted 05/31/2014 with a diagnosis of Closed left hip fracture and elected for surgical management.  The risks benefits and alternatives were discussed with the patient including but not limited to the risks of nonoperative treatment, versus surgical intervention including infection, bleeding, nerve injury, periprosthetic fracture, the need for revision surgery, dislocation, leg length discrepancy, blood clots, cardiopulmonary complications, morbidity, mortality, among others, and they were willing to proceed.  Predicted outcome is good, although there will be at least a six to nine month expected recovery.   OPERATIVE REPORT     SURGEON:  [name], MD    ASSISTANT:  Larkin Ina RNFA     ANESTHESIA:  General    COMPLICATIONS:  None.      COMPONENTS:  [employer] Acolade: Femoral stem: 4.5, Femoral Head:51, Neck:-4   PROCEDURE IN DETAIL: The patient was met in the holding area and identified.  The appropriate hip  was marked at the operative site. The patient was then transported to the OR and  placed under general anesthesia.  At that point, the patient was  placed in the lateral decubitus position with the operative side up and  secured to the operating room table and all bony prominences padded.     The operative lower extremity was prepped from the iliac crest to the toes.  Sterile draping was performed.  Time out was performed prior to incision.      A routine posterolateral approach was utilized via sharp dissection  carried down to the subcutaneous tissue.  Gross bleeders were Bovie  coagulated.  The iliotibial band was identified and incised  along the length of the skin incision.   Self-retaining retractors were  inserted.  With the hip internally rotated, the short external rotators  were identified. The piriformis was tagged with FiberWire, and the hip capsule released in a T-type fashion.  The femoral neck was exposed, and I resected the femoral neck using the appropriate jig. This was performed at approximately a thumb's breadth above the lesser trochanter.    I then exposed the deep acetabulum, cleared out any tissue including the ligamentum teres, and included the hip capsule in the FiberWire used above and below the T.    I then prepared the proximal femur using the cookie-cutter, the lateralizing reamer, and then sequentially broached.  A trial utilized, and I reduced the hip and it was found to have excellent stability with functional range of motion. The trial components were then removed.   The canal and acetabulum were thoroughly irrigated  I inserted the pressfit stem and placed the head and neck collar. The hip was reduced with appropriate force and was stable through a range of motion.   I then used a 2 mm drill bits to pass the FiberWire suture from the capsule and puriform is through the greater trochanter, and secured this. Excellent posterior capsular repair was achieved. I also closed the T in the capsule.  I then irrigated the hip copiously again with pulse lavage, and repaired the fascia with Vicryl, followed by Vicryl for the subcutaneous tissue, Monocryl for the skin, Steri-Strips and sterile gauze. The wounds were injected. The patient was then awakened and returned to  PACU in stable and satisfactory condition. There were no complications.  POST-OP PLAN: Weight bearing as tolerated. DVT px will consist of SCD's and ASA 325  [name], MD Orthopedic Surgeon [phone]   06/01/2014 11:20 AM   This note was generated using a template and dragon dictation system. In light of that, I have reviewed the note and all aspects of it are  applicable to this case. Any dictation errors are due to the computerized dictation system.

## 2014-06-01 NOTE — Transfer of Care (Signed)
Immediate Anesthesia Transfer of Care Note  Patient: Justin Orozco [facility]  Procedure(s) Performed: Procedure(s) with comments: LEFT HIP HEMI ARTHROPLASTY  (Left) - Requesting Larkin Ina or April RNFA  Patient Location: PACU  Anesthesia Type:General  Level of Consciousness: awake  Airway & Oxygen Therapy: Patient Spontanous Breathing and Patient connected to nasal cannula oxygen  Post-op Assessment: Report given to PACU RN, Post -op Vital signs reviewed and stable and Patient moving all extremities  Post vital signs: Reviewed and stable  Complications: No apparent anesthesia complications

## 2014-06-01 NOTE — Progress Notes (Signed)
Patient Demographics  Justin Orozco, is a 80 y.o. male, DOB - [birth year], IRJ:[id number]  Admit date - 05/31/2014   Admitting Physician [name], MD  Outpatient Primary MD for the patient is [name],[name], MD  LOS - 1   No chief complaint on file.     Admission history of present illness/brief narrative: With a history of complete heart block status post pacemaker placement in March 2015, paroxysmal atrial flutter, heart failure, but presented as a direct admission from [name]'s office. Patient was seen for  left hip pain for approximately 2 days. Patient denied any trauma, injury, fall. Patient does have a history of right hip fracture with hemiarthroplasty in 2012. Patient does use a walker to aid in ambulation. Patient does state he had pacemaker placed in March 2015, and follows up with Dr. [name]. Patient denies any chest pain, shortness of breath, recent illness or travel upon admission.  Subjective:   Justin Orozco today has, No headache, No chest pain, No abdominal pain - No Nausea, No new weakness tingling or numbness, No Cough - SOB.   Assessment & Plan    Principal Problem:   Closed left hip fracture Active Problems:   CHF (congestive heart failure)   Lower extremity edema   Paroxysmal atrial flutter   Pacemaker   Hip fracture   Chronic combined systolic and diastolic congestive heart failure  Left  hip fracture -Patient directly admitted to telemetry from Dr. [name]'s office -Tentative surgery 06/01/2014 -Patient is moderate risk secondary to his age as well as history of complete heart block however has pacemaker in place -PT and OT will assess patient post surgery -Patient would like to return home post surgery with home health  Chronic combined systolic and diastolic heart failure -Echo in March 2015 shows EF of  30-16%, grade 1 diastolic dysfunction -Currently appears compensated -Continue home medications of metoprolol, HCTZ -Will monitor patient's intake and output as well as daily weights  History of paroxysmal atrial flutter -Currently rate and appears to be rhythm controlled -Continue metoprolol  History of complete heart block -Patient has dual-chamber pacemaker, implanted 08/2013 - Monitor on telemetry  History of prostate cancer -Patient did receive radiation  Chronic lower extremity edema -Per patient, appears to be at baseline  Code Status: Full  Family Communication: Son at bedside  Disposition Plan: Remains in telemetry, patient prefers home with home care.   Procedures  None   Consults   Orthopedic   Medications  Scheduled Meds: . [MAR Hold] aspirin EC  81 mg Oral Daily  .  ceFAZolin (ANCEF) IV  2 g Intravenous On Call to OR  . [MAR Hold] docusate sodium  100 mg Oral BID  . [MAR Hold] hydrALAZINE  10 mg Oral BID  . [MAR Hold] hydrochlorothiazide  12.5 mg Oral Daily  . [MAR Hold] metoprolol  50 mg Oral Daily  . [MAR Hold] potassium chloride  10 mEq Oral Daily   Continuous Infusions: . sodium chloride 50 mL/hr at 05/31/14 1721  . dextrose 5 % and 0.45% NaCl 100 mL/hr (05/31/14 2030)   PRN Meds:.[MAR Hold] bisacodyl, [MAR Hold] HYDROcodone-acetaminophen, [MAR Hold]  morphine injection, [MAR Hold] senna-docusate  DVT Prophylaxis  SCDs, will start chemical anticoagulation  after procedure.  Lab Results  Component Value Date   PLT 224 05/31/2014    Antibiotics    Anti-infectives    Start     Dose/Rate Route Frequency Ordered Stop   06/01/14 0600  ceFAZolin (ANCEF) IVPB 2 g/50 mL premix     2 g100 mL/hr over 30 Minutes Intravenous On call to O.R. 05/31/14 1507 06/02/14 0559   06/01/14 0600  ceFAZolin (ANCEF) IVPB 2 g/50 mL premix     2 g100 mL/hr over 30 Minutes Intravenous On call to O.R. 05/31/14 1934 06/01/14 1015          Objective:   Filed  Vitals:   05/31/14 1500 05/31/14 1948 06/01/14 0515 06/01/14 0816  BP: 140/63 119/56 140/63 134/56  Pulse: 91 88 60 60  Temp: 98.1 F (36.7 C) 98.2 F (36.8 C) 97.7 F (36.5 C) 98.1 F (36.7 C)  TempSrc: Oral Oral Oral Oral  Resp: 18 16 20    Height:   5' 9.5" (1.765 m)   Weight:   59.194 kg (130 lb 8 oz)   SpO2: 99% 97% 97% 98%    Wt Readings from Last 3 Encounters:  06/01/14 59.194 kg (130 lb 8 oz)  02/01/14 60.328 kg (133 lb)  01/01/14 61.326 kg (135 lb 3.2 oz)     Intake/Output Summary (Last 24 hours) at 06/01/14 1053 Last data filed at 06/01/14 0519  Gross per 24 hour  Intake    240 ml  Output    450 ml  Net   -210 ml     Physical Exam  Awake Alert, Oriented X 3, No new F.N deficits, Normal affect [place].AT,PERRAL Supple Neck,No JVD, No cervical lymphadenopathy appriciated.  Symmetrical Chest wall movement, Good air movement bilaterally, CTAB RRR,No Gallops,Rubs or new Murmurs, No Parasternal Heave +ve B.Sounds, Abd Soft, No tenderness, No organomegaly appriciated, No rebound - guarding or rigidity. No Cyanosis, Clubbing or edema, No new Rash or bruise     Data Review   Micro Results Recent Results (from the past 240 hour(s))  Surgical pcr screen     Status: None   Collection Time: 05/31/14  9:36 PM  Result Value Ref Range Status   MRSA, PCR NEGATIVE NEGATIVE Final   Staphylococcus aureus NEGATIVE NEGATIVE Final    Comment:        The Xpert SA Assay (FDA approved for NASAL specimens in patients over 63 years of age), is one component of a comprehensive surveillance program.  Test performance has been validated by [employer] for patients greater than or equal to 60 year old. It is not intended to diagnose infection nor to guide or monitor treatment.     Radiology Reports Chest Portable 1 View  05/31/2014   CLINICAL DATA:  Preoperative exam, history of congestive heart failure  EXAM: PORTABLE CHEST - 1 VIEW  COMPARISON:  08/22/2013  FINDINGS:  Left-sided pacer in place. Mild enlargement of the cardiomediastinal silhouette is identified. Patchy retrocardiac opacity is identified with trace left pleural effusion. No acute osseous abnormality.  IMPRESSION: Patchy left lower lobe airspace consolidation which could represent atelectasis although early pneumonia or edema could appear similar.   Electronically Signed   By: Conchita Paris M.D.   On: 05/31/2014 23:42    CBC  Recent Labs Lab 05/31/14 1620  WBC 6.6  HGB 13.3  HCT 40.4  PLT 224  MCV 91.0  MCH 30.0  MCHC 32.9  RDW 13.9  LYMPHSABS 1.1  MONOABS 0.7  EOSABS 0.1  BASOSABS  0.0    Chemistries   Recent Labs Lab 05/31/14 1620 05/31/14 2030  NA 141 140  K 3.6* 3.7  CL 102 105  CO2 27 27  GLUCOSE 116* 143*  BUN 38* 40*  CREATININE 1.20 1.35  CALCIUM 9.8 9.1  AST 30 23  ALT 17 13  ALKPHOS 83 70  BILITOT 0.8 0.4   ------------------------------------------------------------------------------------------------------------------ estimated creatinine clearance is 33.5 mL/min (by C-G formula based on Cr of 1.35). ------------------------------------------------------------------------------------------------------------------ No results for input(s): HGBA1C in the last 72 hours. ------------------------------------------------------------------------------------------------------------------ No results for input(s): CHOL, HDL, LDLCALC, TRIG, CHOLHDL, LDLDIRECT in the last 72 hours. ------------------------------------------------------------------------------------------------------------------ No results for input(s): TSH, T4TOTAL, T3FREE, THYROIDAB in the last 72 hours.  Invalid input(s): FREET3 ------------------------------------------------------------------------------------------------------------------ No results for input(s): VITAMINB12, FOLATE, FERRITIN, TIBC, IRON, RETICCTPCT in the last 72 hours.  Coagulation profile No results for input(s): INR,  PROTIME in the last 168 hours.  No results for input(s): DDIMER in the last 72 hours.  Cardiac Enzymes No results for input(s): CKMB, TROPONINI, MYOGLOBIN in the last 168 hours.  Invalid input(s): CK ------------------------------------------------------------------------------------------------------------------ Invalid input(s): POCBNP     Time Spent in minutes   30 minutes   ELGERGAWY, DAWOOD M.D on 06/01/2014 at 10:53 AM  Between 7am to 7pm - Pager - [phone]  After 7pm go to [email or web address] - password [email or web address]  And look for the night coverage person covering for me after hours  [facility] Office  [phone]   **Disclaimer: This note may have been dictated with voice recognition software. Similar sounding words can inadvertently be transcribed and this note may contain transcription errors which may not have been corrected upon publication of note.**

## 2014-06-01 NOTE — Progress Notes (Signed)
NUTRITION FOLLOW UP  Intervention:   1. Supplements; Ensure Complete po BID, each supplement provides 350 kcal and 13 grams of protein 2. General healthful diet; encourage intake of meals and beverages as appropriate.   NUTRITION DIAGNOSIS: Increased nutrient needs related to healing as evidenced by hip fx.  Monitor:  1. Food/Beverage; pt meeting >/=90% estimated needs with tolerance. 2. Wt/wt change; monitor trends  Assessment:   Pt admitted with hip pain, found to have hip fracture.   Patient is s/p surgical repair this AM, now meeting with social work.  RD continues to attempt to find appropriate time to meet with patient for nutrition assessment.  Will resume orders for Ensure.  Note patient has been ordered a Regular diet and tray was being delivered at time of attempted visit.   RD to follow.   Height: Ht Readings from Last 1 Encounters:  06/01/14 5' 9.5" (1.765 m)    Weight Status:   Wt Readings from Last 1 Encounters:  06/01/14 130 lb 8 oz (59.194 kg)    Re-estimated needs:  Kcal: 1600-1800 Protein: 70-85g Fluid: >1.8 L/day  Skin: surgical incision  Diet Order: Diet regular   Intake/Output Summary (Last 24 hours) at 06/01/14 1354 Last data filed at 06/01/14 1127  Gross per 24 hour  Intake   1340 ml  Output    550 ml  Net    790 ml    Last BM: 12/21   Labs:   Recent Labs Lab 05/31/14 1620 05/31/14 2030  NA 141 140  K 3.6* 3.7  CL 102 105  CO2 27 27  BUN 38* 40*  CREATININE 1.20 1.35  CALCIUM 9.8 9.1  GLUCOSE 116* 143*    CBG (last 3)  No results for input(s): GLUCAP in the last 72 hours.  Scheduled Meds: . [START ON 06/02/2014] aspirin EC  325 mg Oral Q breakfast  .  ceFAZolin (ANCEF) IV  2 g Intravenous Q6H  . docusate sodium  100 mg Oral BID  . fentaNYL      . hydrALAZINE  10 mg Oral BID  . hydrochlorothiazide  12.5 mg Oral Daily  . metoprolol  50 mg Oral Daily  . potassium chloride  10 mEq Oral Daily    Continuous  Infusions:   Brynda Greathouse, MS RD LDN Clinical Inpatient Dietitian Weekend/After hours pager: [phone]

## 2014-06-02 ENCOUNTER — Encounter (HOSPITAL_COMMUNITY): Payer: Self-pay | Admitting: Orthopedic Surgery

## 2014-06-02 LAB — BASIC METABOLIC PANEL
ANION GAP: 5 (ref 5–15)
BUN: 28 mg/dL — ABNORMAL HIGH (ref 6–23)
CHLORIDE: 104 meq/L (ref 96–112)
CO2: 29 mmol/L (ref 19–32)
Calcium: 8.4 mg/dL (ref 8.4–10.5)
Creatinine, Ser: 1.28 mg/dL (ref 0.50–1.35)
GFR calc Af Amer: 57 mL/min — ABNORMAL LOW (ref >90)
GFR calc non Af Amer: 49 mL/min — ABNORMAL LOW (ref >90)
GLUCOSE: 119 mg/dL — AB (ref 70–99)
Potassium: 3.7 mmol/L (ref 3.5–5.1)
Sodium: 138 mmol/L (ref 135–145)

## 2014-06-02 LAB — CBC
HEMATOCRIT: 34.5 % — AB (ref 39.0–52.0)
HEMOGLOBIN: 11.3 g/dL — AB (ref 13.0–17.0)
MCH: 30.1 pg (ref 26.0–34.0)
MCHC: 32.8 g/dL (ref 30.0–36.0)
MCV: 91.8 fL (ref 78.0–100.0)
Platelets: 207 10*3/uL (ref 150–400)
RBC: 3.76 MIL/uL — ABNORMAL LOW (ref 4.22–5.81)
RDW: 13.9 % (ref 11.5–15.5)
WBC: 6.3 10*3/uL (ref 4.0–10.5)

## 2014-06-02 MED ORDER — POLYETHYLENE GLYCOL 3350 17 G PO PACK
17.0000 g | PACK | Freq: Every day | ORAL | Status: DC | PRN
  Administered 2014-06-03: 17 g via ORAL
  Filled 2014-06-02 (×2): qty 1

## 2014-06-02 MED ORDER — POLYETHYLENE GLYCOL 3350 17 G PO PACK
17.0000 g | PACK | Freq: Once | ORAL | Status: AC
  Administered 2014-06-02: 17 g via ORAL
  Filled 2014-06-02: qty 1

## 2014-06-02 MED ORDER — POTASSIUM CHLORIDE CRYS ER 20 MEQ PO TBCR
40.0000 meq | EXTENDED_RELEASE_TABLET | Freq: Once | ORAL | Status: AC
  Administered 2014-06-02: 40 meq via ORAL
  Filled 2014-06-02: qty 2

## 2014-06-02 NOTE — Care Management Note (Signed)
    Page 1 of 2   06/07/2014     11:21:33 AM CARE MANAGEMENT NOTE 06/07/2014  Patient:  Justin Orozco, Justin Orozco   Account Number:  [id number]  Date Initiated:  06/02/2014  Documentation initiated by:  Marvetta Gibbons  Subjective/Objective Assessment:   Pt admitted with left hip fx- s/p repair     Action/Plan:   PTA pt lived at home- PT eval,   Anticipated DC Date:  06/04/2014   Anticipated DC Plan:  [place]  CM consult      [place]   Choice offered to / List presented to:  NA        [place] arranged  HH-1 RN  [place]      [place].   Status of service:  Completed, signed off Medicare Important Message given?  YES (If response is "NO", the following Medicare IM given date fields will be blank) Date Medicare IM given:  06/03/2014 Medicare IM given by:  [facility] Date Additional Medicare IM given:  06/07/2014 Additional Medicare IM given by:  [place]  Discharge Disposition:  [place]  Per UR Regulation:  Reviewed for med. necessity/level of care/duration of stay  If discussed at [place] of Stay Meetings, dates discussed:    Comments:  06-07-14 11:15am Luz Lex, RNBSN [phone] Plan for dc to SNF today.  Son, daughter and patient all on board.  Plan for dc to [facility] today  06/04/14 - [place], RN, BSN Received call from Dr. Phillips Climes. He stated that pt is ready to be d/c with HHC. HHC has been arranged. Informed Dr. Waldron Labs that  [facility] is closed today and I will meet with pt to discuss 24/7 care and [place].  Met with pt and family. Per family member, pt may be d/c on Monday. He will have 24/7 care. They agreed to use [facility].   06/02/14- 0900- Marvetta Gibbons RN, BSN [phone] Per conversation with ortho. CM Renee- plan is for pt to return home with [facility] - through [facility]- spoke with [place] who is  aware of pt- plan will be for pt to receive HH-RN/PT/OT/aide- attending notified of plan for home with [facility]- is to place orders for [facility] with F2F.  No DME needs noted at this time.

## 2014-06-02 NOTE — Evaluation (Signed)
Occupational Therapy Evaluation Patient Details Name: Justin Orozco MRN: [id number] DOB: November 24, 1926 Today's Date: 06/02/2014    History of Present Illness 80 y.o. male s/p LEFT HIP HEMI ARTHROPLASTY  following hip fracture.   history of complete heart block status post pacemaker placement in March 2015, paroxysmal atrial flutter, and heart failure.   Clinical Impression   Prior to admission, pt was independent in self care and mobility.  Pt requiring max assist to stand from recliner with max cues for technique.  He was able to stand for several seconds only with heavy reliance on the walker.  +2 assist was required for transfer this visit.  Began education in hip precautions, precautions are posted.  Pt needs to be at a supervision to min assist level for family to care for him at home.  Pt limited by memory deficits and expected post surgical pain.  He will require post acute rehab prior to return home with his supportive family.   Follow Up Recommendations  CIR;Supervision/Assistance - 24 hour    Equipment Recommendations  3 in 1 bedside comode (pt is declining tub equipment)    Recommendations for Other Services       Precautions / Restrictions Precautions Precautions: Posterior Hip Precaution Booklet Issued: Yes (comment) Precaution Comments: Reviewed posterior hip precautions related to ADL and mobility Restrictions Weight Bearing Restrictions: Yes LLE Weight Bearing: Weight bearing as tolerated      Mobility Bed Mobility Overal bed mobility: Needs Assistance Bed Mobility: Rolling;Sidelying to Sit Rolling: Min assist Sidelying to sit: Mod assist       General bed mobility comments: not assessed, pt up in chair  Transfers Overall transfer level: Needs assistance Equipment used: Rolling walker (2 wheeled) Transfers: Sit to/from Stand Sit to Stand: Max assist (from recliner)         General transfer comment: max verbal cues and physical assist to manage L LE  with sit to stand, not using R LE effectively     Balance Overall balance assessment: Needs assistance Sitting-balance support: Feet supported Sitting balance-Leahy Scale: Poor (leans R to take weight off hip)   Postural control: Right lateral lean Standing balance support: Bilateral upper extremity supported Standing balance-Leahy Scale: Poor (unable to release walker in standing)                              ADL Overall ADL's : Needs assistance/impaired Eating/Feeding: Independent;Sitting (supported sitting)   Grooming: Wash/dry hands;Wash/dry face;Sitting;Set up (supported sitting)   Upper Body Bathing: Sitting;Minimal assitance   Lower Body Bathing: Total assistance;Sit to/from stand   Upper Body Dressing : Sitting;Minimal assistance   Lower Body Dressing: Total assistance;Sit to/from stand   Toilet Transfer: +2 for physical assistance;Moderate assistance   Toileting- Clothing Manipulation and Hygiene: Total assistance;Sit to/from stand         General ADL Comments: Pt and daughter said pt had "this same surgery" 2 years ago on the R hip, but neither have recollection of any hip precautions.     Vision                     Perception     Praxis      Pertinent Vitals/Pain Pain Assessment: Faces Pain Score: 7  Faces Pain Scale: Hurts whole lot Pain Location: L hip with mobility Pain Descriptors / Indicators: Aching;Operative site guarding;Grimacing Pain Intervention(s): Limited activity within patient's tolerance;Monitored during session;Premedicated before session;Repositioned;Ice applied  Hand Dominance Right   Extremity/Trunk Assessment Upper Extremity Assessment Upper Extremity Assessment: Overall WFL for tasks assessed   Lower Extremity Assessment Lower Extremity Assessment: Defer to PT evaluation LLE Deficits / Details: Decreased strength and ROM as expected post op. Very guarded LLE: Unable to fully assess due to pain        Communication Communication Communication: No difficulties   Cognition Arousal/Alertness: Awake/alert Behavior During Therapy: WFL for tasks assessed/performed Overall Cognitive Status: Impaired/Different from baseline (no hx of cognitive impairment, but poor historian) Area of Impairment: Memory;Following commands;Awareness     Memory: Decreased short-term memory Following Commands: Follows one step commands inconsistently   Awareness: Emergent       General Comments       Exercises       Shoulder Instructions      Home Living Family/patient expects to be discharged to:: Unsure Living Arrangements: Alone Available Help at Discharge: Friend(s);Available 24 hours/day;Family;Home health (family can arrange 24 hour care, pt needs to be at a min ass) Type of Home: House Home Access: Stairs to enter [employer] of Steps: 5 Entrance Stairs-Rails: Right Home Layout: One level               Home Equipment: [place] - 2 wheels;Cane - single point;Grab bars - tub/shower          Prior Functioning/Environment Level of Independence: Independent             OT Diagnosis: Generalized weakness;Acute pain;Cognitive deficits   OT Problem List: Decreased strength;Decreased activity tolerance;Impaired balance (sitting and/or standing);Decreased cognition;Decreased knowledge of use of DME or AE;Decreased safety awareness;Decreased knowledge of precautions;Pain   OT Treatment/Interventions: Self-care/ADL training;DME and/or AE instruction;Patient/family education;Balance training    OT Goals(Current goals can be found in the care plan section) Acute Rehab OT Goals Patient Stated Goal: Be able to walk. OT Goal Formulation: With patient Time For Goal Achievement: 06/16/14 Potential to Achieve Goals: Good ADL Goals Pt Will Perform Grooming: with supervision;standing Pt Will Perform Lower Body Bathing: with supervision;sit to/from stand;with adaptive equipment Pt  Will Perform Lower Body Dressing: with supervision;with adaptive equipment;sit to/from stand Pt Will Transfer to Toilet: with supervision;ambulating;bedside commode (over toilet) Pt Will Perform Toileting - Clothing Manipulation and hygiene: with supervision;sit to/from stand Additional ADL Goal #1: Pt will state and generalize posterior hip precautions in ADL and mobility. Additional ADL Goal #2: Pt will perform bed mobility with supervision.  OT Frequency: Min 2X/week   Barriers to D/C:            Co-evaluation              End of Session Equipment Utilized During Treatment: Gait belt;Rolling walker Nurse Communication: Mobility status (requires +2 for back to bed)  Activity Tolerance: Patient limited by pain Patient left: in chair;with call bell/phone within reach;with family/visitor present   Time: 2409-7353 OT Time Calculation (min): 35 min Charges:  OT General Charges $OT Visit: 1 Procedure OT Evaluation $Initial OT Evaluation Tier I: 1 Procedure OT Treatments $Self Care/Home Management : 8-22 mins G-Codes:    Malka So 06/02/2014, 12:38 PM  [phone]

## 2014-06-02 NOTE — Progress Notes (Signed)
Patient Demographics  Justin Orozco, is Orozco 80 y.o. male, DOB - 11/03/1926, GHW:[id number]  Admit date - 05/31/2014   Admitting Physician Justin Mutton Kristeen Mans, MD  Outpatient Primary MD for the patient is Justin A, MD  LOS - 2   No chief complaint on file.     Admission history of present illness/brief narrative: With Orozco history of complete heart block status post pacemaker placement in March 2015, paroxysmal atrial flutter, heart failure, but presented as Orozco direct admission from Dr. Christia Reading Orozco's office. Patient was seen for  left hip pain for approximately 2 days. Patient denied any trauma, injury, fall. Patient does have Orozco history of right hip fracture with hemiarthroplasty in 2012. Patient does use Orozco walker to aid in ambulation. Patient does state he had pacemaker placed in March 2015, and follows up with Dr. Gwenlyn Orozco. Patient denies any chest pain, shortness of breath, recent illness or travel upon admission. Patient underwent left hip hemiarthroplasty by Dr. Edmonia Orozco on 12/22 which he tolerated very well.  Subjective:   [name] today has, No headache, No chest pain, No abdominal pain - No Nausea, No new weakness tingling or numbness, No Cough - SOB. Complaints of mild left hip pain.  Assessment & Plan    Principal Problem:   Closed left hip fracture Active Problems:   CHF (congestive heart failure)   Lower extremity edema   Paroxysmal atrial flutter   Pacemaker   Hip fracture   Chronic combined systolic and diastolic congestive heart failure  Left  hip fracture -Patient directly admitted to telemetry from Dr. Octavia Bruckner Orozco's office -Left hip hemiarthroplasty 06/01/2014 -PT and OT . - SCDs and 325 mg aspirin for DVT prophylaxis.  Chronic combined systolic and diastolic heart failure -Echo in March 2015 shows EF of 40-45%, grade 1  diastolic dysfunction -Currently appears compensated -Continue home medications of metoprolol, HCTZ -Will monitor patient's intake and output as well as daily weights  History of paroxysmal atrial flutter -Currently rate and appears to be rhythm controlled -Continue metoprolol  History of complete heart block -Patient has dual-chamber pacemaker, implanted 08/2013 - Monitor on telemetry  History of prostate cancer -Patient did receive radiation  Chronic lower extremity edema -Per patient, appears to be at baseline  Code Status: Full  Family Communication: Son at bedside  Disposition Plan: Remains in telemetry, home with home PT versus subacute rehabilitation.   Procedures  Left hip hemiarthroplasty 12/22   Consults   Orthopedic Dr. Edmonia Orozco   Medications  Scheduled Meds: . aspirin EC  325 mg Oral Q breakfast  . docusate sodium  100 mg Oral BID  . feeding supplement (ENSURE COMPLETE)  237 mL Oral BID BM  . hydrALAZINE  10 mg Oral BID  . hydrochlorothiazide  12.5 mg Oral Daily  . metoprolol  50 mg Oral Daily  . potassium chloride  10 mEq Oral Daily  . potassium chloride  40 mEq Oral Once   Continuous Infusions:   PRN Meds:.acetaminophen **OR** acetaminophen, bisacodyl, HYDROcodone-acetaminophen, menthol-cetylpyridinium **OR** phenol, morphine injection, senna-docusate  DVT Prophylaxis  SCDs, and aspirin 325 mg.  Lab Results  Component Value Date   PLT 207 06/02/2014    Antibiotics    Anti-infectives    Start  Dose/Rate Route Frequency Ordered Stop   06/01/14 1600  ceFAZolin (ANCEF) IVPB 2 g/50 mL premix     2 g100 mL/hr over 30 Minutes Intravenous Every 6 hours 06/01/14 1341 06/01/14 2203   06/01/14 0600  ceFAZolin (ANCEF) IVPB 2 g/50 mL premix  Status:  Discontinued     2 g100 mL/hr over 30 Minutes Intravenous On call to O.R. 05/31/14 1507 06/01/14 1338   06/01/14 0600  ceFAZolin (ANCEF) IVPB 2 g/50 mL premix     2 g100 mL/hr over 30 Minutes  Intravenous On call to O.R. 05/31/14 1934 06/01/14 1015          Objective:   Filed Vitals:   06/01/14 2055 06/02/14 0000 06/02/14 0400 06/02/14 0609  BP: 107/37   128/55  Pulse: 61   87  Temp: 98.5 F (36.9 C)   99.3 F (37.4 C)  TempSrc: Oral   Oral  Resp: 18 18 18 18   Height:      Weight:      SpO2: 99% 96% 96% 97%    Wt Readings from Last 3 Encounters:  06/01/14 59.194 kg (130 lb 8 oz)  02/01/14 60.328 kg (133 lb)  01/01/14 61.326 kg (135 lb 3.2 oz)     Intake/Output Summary (Last 24 hours) at 06/02/14 1011 Last data filed at 06/02/14 0800  Gross per 24 hour  Intake   1220 ml  Output    550 ml  Net    670 ml     Physical Exam  Awake Alert, Oriented X 3, No new F.N deficits, Normal affect [place].AT,PERRAL Supple Neck,No JVD, No cervical lymphadenopathy appriciated.  Symmetrical Chest wall movement, Good air movement bilaterally, CTAB RRR,No Gallops,Rubs or new Murmurs, No Parasternal Heave +ve B.Sounds, Abd Soft, No tenderness, No organomegaly appriciated, No rebound - guarding or rigidity. No Cyanosis, Clubbing or edema, No new Rash or bruise , good pulses bilaterally   Data Review   Micro Results Recent Results (from the past 240 hour(s))  Surgical pcr screen     Status: None   Collection Time: 05/31/14  9:36 PM  Result Value Ref Range Status   MRSA, PCR NEGATIVE NEGATIVE Final   Staphylococcus aureus NEGATIVE NEGATIVE Final    Comment:        The Xpert SA Assay (FDA approved for NASAL specimens in patients over 24 years of age), is one component of Orozco comprehensive surveillance program.  Test performance has been validated by [employer] for patients greater than or equal to 25 year old. It is not intended to diagnose infection nor to guide or monitor treatment.     Radiology Reports Pelvis Portable  06/01/2014   CLINICAL DATA:  Status post left hip replacement  EXAM: PORTABLE PELVIS 1-2 VIEWS  COMPARISON:  09/06/2010  FINDINGS: Orozco left  hip replacement is noted. Old right hip replacement is again seen. No acute abnormality is seen. No soft tissue changes are noted.  IMPRESSION: Status post bilateral hip replacement. No acute abnormality is noted.   Electronically Signed   By: Inez Catalina M.D.   On: 06/01/2014 12:52   Chest Portable 1 View  05/31/2014   CLINICAL DATA:  Preoperative exam, history of congestive heart failure  EXAM: PORTABLE CHEST - 1 VIEW  COMPARISON:  08/22/2013  FINDINGS: Left-sided pacer in place. Mild enlargement of the cardiomediastinal silhouette is identified. Patchy retrocardiac opacity is identified with trace left pleural effusion. No acute osseous abnormality.  IMPRESSION: Patchy left lower lobe airspace consolidation which  could represent atelectasis although early pneumonia or edema could appear similar.   Electronically Signed   By: Conchita Paris M.D.   On: 05/31/2014 23:42    CBC  Recent Labs Lab 05/31/14 1620 06/02/14 0241  WBC 6.6 6.3  HGB 13.3 11.3*  HCT 40.4 34.5*  PLT 224 207  MCV 91.0 91.8  MCH 30.0 30.1  MCHC 32.9 32.8  RDW 13.9 13.9  LYMPHSABS 1.1  --   MONOABS 0.7  --   EOSABS 0.1  --   BASOSABS 0.0  --     Chemistries   Recent Labs Lab 05/31/14 1620 05/31/14 2030 06/02/14 0241  NA 141 140 138  K 3.6* 3.7 3.7  CL 102 105 104  CO2 27 27 29   GLUCOSE 116* 143* 119*  BUN 38* 40* 28*  CREATININE 1.20 1.35 1.28  CALCIUM 9.8 9.1 8.4  AST 30 23  --   ALT 17 13  --   ALKPHOS 83 70  --   BILITOT 0.8 0.4  --    ------------------------------------------------------------------------------------------------------------------ estimated creatinine clearance is 35.3 mL/min (by C-G formula based on Cr of 1.28). ------------------------------------------------------------------------------------------------------------------ No results for input(s): HGBA1C in the last 72  hours. ------------------------------------------------------------------------------------------------------------------ No results for input(s): CHOL, HDL, LDLCALC, TRIG, CHOLHDL, LDLDIRECT in the last 72 hours. ------------------------------------------------------------------------------------------------------------------ No results for input(s): TSH, T4TOTAL, T3FREE, THYROIDAB in the last 72 hours.  Invalid input(s): FREET3 ------------------------------------------------------------------------------------------------------------------ No results for input(s): VITAMINB12, FOLATE, FERRITIN, TIBC, IRON, RETICCTPCT in the last 72 hours.  Coagulation profile No results for input(s): INR, PROTIME in the last 168 hours.  No results for input(s): DDIMER in the last 72 hours.  Cardiac Enzymes No results for input(s): CKMB, TROPONINI, MYOGLOBIN in the last 168 hours.  Invalid input(s): CK ------------------------------------------------------------------------------------------------------------------ Invalid input(s): POCBNP     Time Spent in minutes   30 minutes   Syleena Mchan M.D on 06/02/2014 at 10:11 AM  Between 7am to 7pm - Pager - [phone]  After 7pm go to [email or web address] - password [email or web address]  And look for the night coverage person covering for me after hours  [facility] Office  [phone]   **Disclaimer: This note may have been dictated with voice recognition software. Similar sounding words can inadvertently be transcribed and this note may contain transcription errors which may not have been corrected upon publication of note.**

## 2014-06-02 NOTE — Clinical Social Work Note (Signed)
CSW received referral for SNF.  Case discussed with case manager, and plan is to discharge home with home health.  CSW to sign off please re-consult if social work needs arise.  Cassundra Mckeever R. Kailand Seda, MSW, LCSWA [phone]  

## 2014-06-02 NOTE — Evaluation (Signed)
Physical Therapy Evaluation Patient Details Name: Justin Orozco MRN: [id number] DOB: 15-Sep-1926 Today's Date: 06/02/2014   History of Present Illness  80 y.o. male s/p LEFT HIP HEMI ARTHROPLASTY  following hip fracture.   history of complete heart block status post pacemaker placement in March 2015, paroxysmal atrial flutter, and heart failure.  Clinical Impression  Patient is seen following the above procedure and presents with functional limitations due to the deficits listed below (see PT Problem List). Previously independent prior to admission, pt now requires min-mod assist for mobility. Very motivated to regain functional independence and has strong family support. Due to prior level of function and present comorbidities patient may be a good candidate for CIR. At this time I do not believe home would be a safe option for patient due to level of dependence for safe mobility. Will continue to follow and update recommendations based on progression. Patient will benefit from skilled PT to increase their independence and safety with mobility to allow discharge to the venue listed below.       Follow Up Recommendations CIR    Equipment Recommendations  3in1 (PT)    Recommendations for Other Services Rehab consult;OT consult     Precautions / Restrictions Precautions Precautions: Posterior Hip Precaution Booklet Issued: Yes (comment) Precaution Comments: Reviewed posterior hip precautions Restrictions Weight Bearing Restrictions: Yes LLE Weight Bearing: Weight bearing as tolerated      Mobility  Bed Mobility Overal bed mobility: Needs Assistance Bed Mobility: Rolling;Sidelying to Sit Rolling: Min assist Sidelying to sit: Mod assist       General bed mobility comments: Used log roll techniuqe with pillow between knees to prevent Lt hip adduction.  Very slow due to pain. Mod assist for truncal support into seated position. VC throughout for technique.  Transfers Overall  transfer level: Needs assistance Equipment used: Rolling walker (2 wheeled) Transfers: Sit to/from Stand Sit to Stand: Mod assist;From elevated surface         General transfer comment: First trial from elevated bed surface required Mod assist for boost to stand. VC for hand placement. Second attempt with min assist. Max VC for upright posture once standing.  Ambulation/Gait Ambulation/Gait assistance: Min assist Ambulation Distance (Feet): 5 Feet Assistive device: Rolling walker (2 wheeled) Gait Pattern/deviations: Step-to pattern;Decreased step length - right;Decreased stance time - left;Antalgic;Trunk flexed Gait velocity: decreased   General Gait Details: Educated on safe DME use with a rolling walker. Max VC for upright posture. Min assist for walker placement for proximity. Cues for sequencing of gait.  Stairs            Wheelchair Mobility    Modified Rankin (Stroke Patients Only)       Balance Overall balance assessment: Needs assistance Sitting-balance support: Feet supported;Single extremity supported Sitting balance-Leahy Scale: Poor   Postural control: Right lateral lean Standing balance support: Bilateral upper extremity supported Standing balance-Leahy Scale: Poor                               Pertinent Vitals/Pain Pain Assessment: 0-10 Pain Score: 7  Pain Location: Lt hip Pain Descriptors / Indicators: Stabbing Pain Intervention(s): Monitored during session;Repositioned;Premedicated before session    Home Living Family/patient expects to be discharged to:: Unsure Living Arrangements: Alone Available Help at Discharge: Friend(s);Available 24 hours/day;Family;Home health (Family states they can provide 24 hr care if needed) Type of Home: House Home Access: Stairs to enter Entrance Stairs-Rails: Right Entrance Stairs-Number  of Steps: 5 Home Layout: One level Home Equipment: [place] - 2 wheels;Cane - single point      Prior Function  Level of Independence: Independent               Hand Dominance   Dominant Hand: Right    Extremity/Trunk Assessment   Upper Extremity Assessment: Defer to OT evaluation           Lower Extremity Assessment: LLE deficits/detail   LLE Deficits / Details: Decreased strength and ROM as expected post op. Very guarded     Communication   Communication: No difficulties  Cognition Arousal/Alertness: Awake/alert Behavior During Therapy: WFL for tasks assessed/performed Overall Cognitive Status: Within Functional Limits for tasks assessed                      General Comments      Exercises        Assessment/Plan    PT Assessment Patient needs continued PT services  PT Diagnosis Difficulty walking;Abnormality of gait;Acute pain   PT Problem List Decreased strength;Decreased range of motion;Decreased activity tolerance;Decreased balance;Decreased mobility;Decreased knowledge of use of DME;Decreased knowledge of precautions;Pain  PT Treatment Interventions DME instruction;Gait training;Functional mobility training;Therapeutic activities;Therapeutic exercise;Balance training;Neuromuscular re-education;Cognitive remediation;Patient/family education;Modalities   PT Goals (Current goals can be found in the Care Plan section) Acute Rehab PT Goals Patient Stated Goal: Go home PT Goal Formulation: With patient Time For Goal Achievement: 06/16/14 Potential to Achieve Goals: Good    Frequency Min 5X/week   Barriers to discharge Decreased caregiver support Lives alone, however family states they can arrange 24 hour care    Co-evaluation               End of Session Equipment Utilized During Treatment: Gait belt Activity Tolerance: Patient limited by pain Patient left: in chair;with call bell/phone within reach;with family/visitor present Nurse Communication: Mobility status;Precautions;Weight bearing status         Time: 8466-5993 PT Time Calculation  (min) (ACUTE ONLY): 42 min   Charges:   PT Evaluation $Initial PT Evaluation Tier I: 1 Procedure PT Treatments $Therapeutic Activity: 23-37 mins   PT G CodesEllouise Newer 06/02/2014, 9:55 AM Elayne Snare, [place]

## 2014-06-02 NOTE — Progress Notes (Signed)
Rehab Admissions Coordinator Note:  Patient was screened by Cleatrice Burke for appropriateness for an Inpatient Acute Rehab Consult per PT recommendation. I clarified with orthopedic CM and the plan is for Home with HH.  At this time, we are recommending HH. Please call me with any questions or Renee Veteran ortho CM .  Cleatrice Burke 06/02/2014, 10:26 AM  I can be reached at [phone].

## 2014-06-03 LAB — BASIC METABOLIC PANEL
Anion gap: 5 (ref 5–15)
BUN: 38 mg/dL — ABNORMAL HIGH (ref 6–23)
CO2: 26 mmol/L (ref 19–32)
Calcium: 8.5 mg/dL (ref 8.4–10.5)
Chloride: 102 mEq/L (ref 96–112)
Creatinine, Ser: 1.37 mg/dL — ABNORMAL HIGH (ref 0.50–1.35)
GFR calc non Af Amer: 45 mL/min — ABNORMAL LOW (ref >90)
GFR, EST AFRICAN AMERICAN: 53 mL/min — AB (ref >90)
Glucose, Bld: 103 mg/dL — ABNORMAL HIGH (ref 70–99)
POTASSIUM: 3.9 mmol/L (ref 3.5–5.1)
SODIUM: 133 mmol/L — AB (ref 135–145)

## 2014-06-03 LAB — CBC
HCT: 34 % — ABNORMAL LOW (ref 39.0–52.0)
HEMOGLOBIN: 11.2 g/dL — AB (ref 13.0–17.0)
MCH: 30.1 pg (ref 26.0–34.0)
MCHC: 32.9 g/dL (ref 30.0–36.0)
MCV: 91.4 fL (ref 78.0–100.0)
Platelets: 183 10*3/uL (ref 150–400)
RBC: 3.72 MIL/uL — ABNORMAL LOW (ref 4.22–5.81)
RDW: 14 % (ref 11.5–15.5)
WBC: 10.2 10*3/uL (ref 4.0–10.5)

## 2014-06-03 NOTE — Progress Notes (Signed)
Discussed with ortho CM. Ortho wishes for an inpt rehab consult to assess inpt rehab vs SNF rehab. I will place order. [phone]

## 2014-06-03 NOTE — Progress Notes (Signed)
Physical medicine and rehabilitation consult requested with chart reviewed. Patient status post hip fracture postoperative day 2 and currently min assist level progressing nicely. Patient should progress to a supervision level for the next couple of days and recommend discharge to home with home health therapies. All issues in regards to this were discussed with Dr. Eusebio Me in regards to discharge planning

## 2014-06-03 NOTE — Progress Notes (Signed)
Occupational Therapy Treatment Patient Details Name: Justin Orozco MRN: [id number] DOB: [birth year] Today's Date: 06/03/2014    History of present illness 80 y.o. male s/p LEFT HIP HEMI ARTHROPLASTY  following hip fracture.   history of complete heart block status post pacemaker placement in March 2015, paroxysmal atrial flutter, and heart failure.   OT comments  Focus of session on instruction in safety, hip precautions related to ADL, use of AE for LB ADL, use of 3 in 1 and tub bench.  Daughter present for education. Pt continues to need considerable assist for ADL and further education in precautions.  Unable to release walker in standing for grooming or LB bathing and dressing. Plan is now for home. Will continue to follow.  Follow Up Recommendations  Home health OT;Supervision/Assistance - 24 hour    Equipment Recommendations  3 in 1 bedside comode    Recommendations for Other Services      Precautions / Restrictions Precautions Precautions: Posterior Hip Precaution Comments: Reviewed posterior hip precautions Restrictions LLE Weight Bearing: Weight bearing as tolerated       Mobility Bed Mobility               General bed mobility comments: Pt up in chair.  Transfers   Equipment used: Rolling walker (2 wheeled) Transfers: Sit to/from Stand Sit to Stand: Mod assist         General transfer comment: from recliner, assist to rise, verbal cues for hand and L LE placement, assist to weight shift forward    Balance Overall balance assessment: Needs assistance             Standing balance comment: Pt unable to release walker in standing to manage pants or groom at sink.                   ADL Overall ADL's : Needs assistance/impaired               Lower Body Bathing Details (indicate cue type and reason): instructed pt in use of long handled bath sponge, educated pt and daughter in need for a sponge with an extended handle     Lower Body  Dressing: Moderate assistance;With adaptive equipment;Sit to/from stand Lower Body Dressing Details (indicate cue type and reason): instructed in use of reacher, sock aide, long shoe horn               General ADL Comments: Pt has a reacher, sock aide, and long shoe horn at home.  Routinely uses long shoe horn.  Educated pt and daughter in safe footwear.  Instructed in use of 3 in 1 over the toilet.  Pt uses a urinal at night at home.  Showed pt and daughter a tub transfer bench. Pt adamant that he will stand for showering using the grab bars in his tub.  Daughter aware of how to obtain a bench should pt become agreeable.      Vision                     Perception     Praxis      Cognition   Behavior During Therapy: [facility] for tasks assessed/performed   Area of Impairment: Memory;Safety/judgement     Memory: Decreased short-term memory    Safety/Judgement: Decreased awareness of safety;Decreased awareness of deficits          Extremity/Trunk Assessment               Exercises  Shoulder Instructions       General Comments      Pertinent Vitals/ Pain       Pain Assessment: Faces Faces Pain Scale: Hurts even more Pain Location: L hip Pain Descriptors / Indicators: Aching;Operative site guarding;Guarding Pain Intervention(s): Limited activity within patient's tolerance;Monitored during session;Premedicated before session;Repositioned  Home Living                                          Prior Functioning/Environment              Frequency Min 2X/week     Progress Toward Goals  OT Goals(current goals can now be found in the care plan section)  Progress towards OT goals: Progressing toward goals  Acute Rehab OT Goals Patient Stated Goal: Go home  Plan Discharge plan needs to be updated    Co-evaluation                 End of Session Equipment Utilized During Treatment: Gait belt;Rolling walker   Activity  Tolerance Patient limited by pain   Patient Left in chair;with call bell/phone within reach;with family/visitor present   Nurse Communication          Time: 8938-1017 OT Time Calculation (min): 24 min  Charges: OT General Charges $OT Visit: 1 Procedure OT Treatments $Self Care/Home Management : 23-37 mins  Malka So 06/03/2014, 3:41 PM  [phone]

## 2014-06-03 NOTE — Progress Notes (Signed)
Patient ID: Justin Orozco, male   DOB: Oct 07, 1926, 80 y.o.   MRN: [id number]     Subjective:  Patient reports pain as mild.  Patient denies any CP or SOB  Objective:   VITALS:   Filed Vitals:   06/02/14 0609 06/02/14 1404 06/02/14 1956 06/03/14 0405  BP: 128/55 99/45 112/52 120/56  Pulse: 87 71 78 77  Temp: 99.3 F (37.4 C) 98.6 F (37 C) 98.2 F (36.8 C) 97.8 F (36.6 C)  TempSrc: Oral Oral Oral Oral  Resp: 18 18 19 17   Height:      Weight:      SpO2: 97% 97% 98% 96%    ABD soft Sensation intact distally Dorsiflexion/Plantar flexion intact Incision: dressing C/D/I and scant drainage   Lab Results  Component Value Date   WBC 10.2 06/03/2014   HGB 11.2* 06/03/2014   HCT 34.0* 06/03/2014   MCV 91.4 06/03/2014   PLT 183 06/03/2014     Assessment/Plan: 2 Days Post-Op   Principal Problem:   Closed left hip fracture Active Problems:   CHF (congestive heart failure)   Lower extremity edema   Paroxysmal atrial flutter   Pacemaker   Hip fracture   Chronic combined systolic and diastolic congestive heart failure   Advance diet Up with therapy  Continue plan per medicine WBAT Dry dressing PRN    Remonia Richter 06/03/2014, 8:46 AM   [name] MD [phone]

## 2014-06-03 NOTE — Progress Notes (Signed)
Physical Therapy Treatment Patient Details Name: Justin Orozco MRN: [id number] DOB: 12-08-1926 Today's Date: 06/03/2014    History of Present Illness 80 y.o. male s/p LEFT HIP HEMI ARTHROPLASTY  following hip fracture.   history of complete heart block status post pacemaker placement in March 2015, paroxysmal atrial flutter, and heart failure.    PT Comments    Tolerated ambulating up to 15 feet with min assist today while using a rolling walker for support. Required max assist with bed mobility. Performs therapeutic exercises well, but recalls 0/3 hip precautions which were reviewed with patient again. Patient will continue to benefit from skilled physical therapy services to further improve independence with functional mobility.   Follow Up Recommendations  CIR     Equipment Recommendations  3in1 (PT)    Recommendations for Other Services Rehab consult;OT consult     Precautions / Restrictions Precautions Precautions: Posterior Hip Precaution Booklet Issued: Yes (comment) Precaution Comments: Reviewed posterior hip precautions Restrictions Weight Bearing Restrictions: Yes LLE Weight Bearing: Weight bearing as tolerated    Mobility  Bed Mobility Overal bed mobility: Needs Assistance Bed Mobility: Rolling;Sidelying to Sit;Supine to Sit Rolling: Mod assist   Supine to sit: Max assist     General bed mobility comments: Pt unable to tolerate log roll with pillow to prevent adduction of LLE today. Required max assist for LE support and to pull trunk into seated position. leans heavily to Rt side. Able to scoot to edge of bed with min assist. Very slow.  Transfers Overall transfer level: Needs assistance Equipment used: Rolling walker (2 wheeled) Transfers: Sit to/from Stand Sit to Stand: From elevated surface;Min assist         General transfer comment: Min assist for boost to stand. VC for foot and hand placement. Leans posteriorly requiring min assist to correct  balance intially.  Ambulation/Gait Ambulation/Gait assistance: Min assist Ambulation Distance (Feet): 15 Feet Assistive device: Rolling walker (2 wheeled) Gait Pattern/deviations: Step-to pattern;Decreased step length - right;Decreased stance time - left;Antalgic;Decreased dorsiflexion - left;Shuffle;Trunk flexed;Narrow base of support Gait velocity: decreased   General Gait Details: Very slow and guarded. Cues for sequencing and foot placement. Difficulty with Lt foot clearance intially but progressed slightly with instruction. No loss of balance however required min assist for walker control intermittently.   Stairs            Wheelchair Mobility    Modified Rankin (Stroke Patients Only)       Balance                                    Cognition Arousal/Alertness: Awake/alert Behavior During Therapy: WFL for tasks assessed/performed Overall Cognitive Status: Within Functional Limits for tasks assessed                      Exercises General Exercises - Lower Extremity Ankle Circles/Pumps: AROM;Both;10 reps;Supine Quad Sets: Strengthening;Both;10 reps;Supine Long Arc Quad: Strengthening;Both;10 reps;Seated Hip ABduction/ADduction: AAROM;Strengthening;Left;10 reps;Supine    General Comments        Pertinent Vitals/Pain Pain Assessment: 0-10 Pain Score:  ("okay until I move" no value given) Pain Location: Lt hip Pain Descriptors / Indicators: Aching;Sharp Pain Intervention(s): Limited activity within patient's tolerance;Monitored during session;Repositioned;Patient requesting pain meds-RN notified    Home Living                      Prior Function  PT Goals (current goals can now be found in the care plan section) Acute Rehab PT Goals Patient Stated Goal: Go home PT Goal Formulation: With patient Time For Goal Achievement: 06/16/14 Potential to Achieve Goals: Good Progress towards PT goals: Progressing toward  goals    Frequency  Min 5X/week    PT Plan Current plan remains appropriate    Co-evaluation             End of Session Equipment Utilized During Treatment: Gait belt Activity Tolerance: Patient limited by pain Patient left: in chair;with call bell/phone within reach     Time: 0857-0925 PT Time Calculation (min) (ACUTE ONLY): 28 min  Charges:  $Therapeutic Exercise: 8-22 mins $Therapeutic Activity: 8-22 mins                    G Codes:      [name] 07/01/2014, 10:15 AM Elayne Snare, [place]

## 2014-06-03 NOTE — Progress Notes (Signed)
Patient Demographics  Justin Orozco, is a 80 y.o. male, DOB - March 02, 1927, WHQ:[id number]  Admit date - 05/31/2014   Admitting Physician [name], MD  Outpatient Primary MD for the patient is [name],[name], MD  LOS - 3   No chief complaint on file.     Admission history of present illness/brief narrative: With a history of complete heart block status post pacemaker placement in March 2015, paroxysmal atrial flutter, heart failure, but presented as a direct admission from [name]'s office. Patient was seen for  left hip pain for approximately 2 days. Patient denied any trauma, injury, fall. Patient does have a history of right hip fracture with hemiarthroplasty in 2012. Patient does use a walker to aid in ambulation. Patient does state he had pacemaker placed in March 2015, and follows up with Dr. [name]. Patient denies any chest pain, shortness of breath, recent illness or travel upon admission. Patient underwent left hip hemiarthroplasty by [name] on 12/22 which he tolerated very well.  Subjective:   Justin Orozco today has, No headache, No chest pain, No abdominal pain - No Nausea, No new weakness tingling or numbness, No Cough - SOB. Complaints of mild left hip pain.  Assessment & Plan    Principal Problem:   Closed left hip fracture Active Problems:   CHF (congestive heart failure)   Lower extremity edema   Paroxysmal atrial flutter   Pacemaker   Hip fracture   Chronic combined systolic and diastolic congestive heart failure  Left  hip fracture -Patient directly admitted to telemetry from Dr. [name]'s office -Left hip hemiarthroplasty 06/01/2014 -PT and OT . - SCDs and 325 mg aspirin for DVT prophylaxis.  Chronic combined systolic and diastolic heart failure -Echo in March 2015 shows EF of 40-45%, grade 1  diastolic dysfunction -Currently appears compensated -Continue home medications of metoprolol,  -Will monitor patient's intake and output as well as daily weights - Hold hydrochlorothiazide giving mild hyponatremia.  History of paroxysmal atrial flutter -Currently rate and appears to be rhythm controlled -Continue metoprolol  History of complete heart block -Patient has dual-chamber pacemaker, implanted 08/2013 - Monitor on telemetry  History of prostate cancer -Patient did receive radiation  Chronic lower extremity edema -Per patient, appears to be at baseline  Code Status: Full  Family Communication: Son at bedside  Disposition Plan: Remains in telemetry, home with home PT versus subacute rehabilitation.   Procedures  Left hip hemiarthroplasty 12/22   Consults   Orthopedic [name]   Medications  Scheduled Meds: . aspirin EC  325 mg Oral Q breakfast  . docusate sodium  100 mg Oral BID  . feeding supplement (ENSURE COMPLETE)  237 mL Oral BID BM  . hydrALAZINE  10 mg Oral BID  . metoprolol  50 mg Oral Daily  . potassium chloride  10 mEq Oral Daily   Continuous Infusions:   PRN Meds:.acetaminophen **OR** acetaminophen, bisacodyl, HYDROcodone-acetaminophen, menthol-cetylpyridinium **OR** phenol, morphine injection, polyethylene glycol, senna-docusate  DVT Prophylaxis  SCDs, and aspirin 325 mg.  Lab Results  Component Value Date   PLT 183 06/03/2014    Antibiotics    Anti-infectives    Start     Dose/Rate Route Frequency Ordered Stop  06/01/14 1600  ceFAZolin (ANCEF) IVPB 2 g/50 mL premix     2 g100 mL/hr over 30 Minutes Intravenous Every 6 hours 06/01/14 1341 06/01/14 2203   06/01/14 0600  ceFAZolin (ANCEF) IVPB 2 g/50 mL premix  Status:  Discontinued     2 g100 mL/hr over 30 Minutes Intravenous On call to O.R. 05/31/14 1507 06/01/14 1338   06/01/14 0600  ceFAZolin (ANCEF) IVPB 2 g/50 mL premix     2 g100 mL/hr over 30 Minutes Intravenous On  call to O.R. 05/31/14 1934 06/01/14 1015          Objective:   Filed Vitals:   06/02/14 1404 06/02/14 1956 06/03/14 0405 06/03/14 1316  BP: 99/45 112/52 120/56 103/39  Pulse: 71 78 77 60  Temp: 98.6 F (37 C) 98.2 F (36.8 C) 97.8 F (36.6 C) 98.4 F (36.9 C)  TempSrc: Oral Oral Oral Oral  Resp: 18 19 17 18   Height:      Weight:      SpO2: 97% 98% 96% 100%    Wt Readings from Last 3 Encounters:  06/01/14 59.194 kg (130 lb 8 oz)  02/01/14 60.328 kg (133 lb)  01/01/14 61.326 kg (135 lb 3.2 oz)     Intake/Output Summary (Last 24 hours) at 06/03/14 1406 Last data filed at 06/03/14 0730  Gross per 24 hour  Intake    480 ml  Output    800 ml  Net   -320 ml     Physical Exam  Awake Alert, Oriented X 3, No new F.N deficits, Normal affect [place].AT,PERRAL Supple Neck,No JVD, No cervical lymphadenopathy appriciated.  Symmetrical Chest wall movement, Good air movement bilaterally, CTAB RRR,No Gallops,Rubs or new Murmurs, No Parasternal Heave +ve B.Sounds, Abd Soft, No tenderness, No organomegaly appriciated, No rebound - guarding or rigidity. No Cyanosis, Clubbing or edema, No new Rash or bruise , good pulses bilaterally   Data Review   Micro Results Recent Results (from the past 240 hour(s))  Surgical pcr screen     Status: None   Collection Time: 05/31/14  9:36 PM  Result Value Ref Range Status   MRSA, PCR NEGATIVE NEGATIVE Final   Staphylococcus aureus NEGATIVE NEGATIVE Final    Comment:        The Xpert SA Assay (FDA approved for NASAL specimens in patients over 20 years of age), is one component of a comprehensive surveillance program.  Test performance has been validated by [employer] for patients greater than or equal to 35 year old. It is not intended to diagnose infection nor to guide or monitor treatment.     Radiology Reports No results found.  CBC  Recent Labs Lab 05/31/14 1620 06/02/14 0241 06/03/14 0446  WBC 6.6 6.3 10.2  HGB  13.3 11.3* 11.2*  HCT 40.4 34.5* 34.0*  PLT 224 207 183  MCV 91.0 91.8 91.4  MCH 30.0 30.1 30.1  MCHC 32.9 32.8 32.9  RDW 13.9 13.9 14.0  LYMPHSABS 1.1  --   --   MONOABS 0.7  --   --   EOSABS 0.1  --   --   BASOSABS 0.0  --   --     Chemistries   Recent Labs Lab 05/31/14 1620 05/31/14 2030 06/02/14 0241 06/03/14 0446  NA 141 140 138 133*  K 3.6* 3.7 3.7 3.9  CL 102 105 104 102  CO2 27 27 29 26   GLUCOSE 116* 143* 119* 103*  BUN 38* 40* 28* 38*  CREATININE 1.20  1.35 1.28 1.37*  CALCIUM 9.8 9.1 8.4 8.5  AST 30 23  --   --   ALT 17 13  --   --   ALKPHOS 83 70  --   --   BILITOT 0.8 0.4  --   --    ------------------------------------------------------------------------------------------------------------------ estimated creatinine clearance is 33 mL/min (by C-G formula based on Cr of 1.37). ------------------------------------------------------------------------------------------------------------------ No results for input(s): HGBA1C in the last 72 hours. ------------------------------------------------------------------------------------------------------------------ No results for input(s): CHOL, HDL, LDLCALC, TRIG, CHOLHDL, LDLDIRECT in the last 72 hours. ------------------------------------------------------------------------------------------------------------------ No results for input(s): TSH, T4TOTAL, T3FREE, THYROIDAB in the last 72 hours.  Invalid input(s): FREET3 ------------------------------------------------------------------------------------------------------------------ No results for input(s): VITAMINB12, FOLATE, FERRITIN, TIBC, IRON, RETICCTPCT in the last 72 hours.  Coagulation profile No results for input(s): INR, PROTIME in the last 168 hours.  No results for input(s): DDIMER in the last 72 hours.  Cardiac Enzymes No results for input(s): CKMB, TROPONINI, MYOGLOBIN in the last 168 hours.  Invalid input(s):  CK ------------------------------------------------------------------------------------------------------------------ Invalid input(s): POCBNP     Time Spent in minutes   30 minutes   Mithcell Schumpert M.D on 06/03/2014 at 2:06 PM  Between 7am to 7pm - Pager - [phone]  After 7pm go to [email or web address] - password [email or web address]  And look for the night coverage person covering for me after hours  [facility] Office  [phone]   **Disclaimer: This note may have been dictated with voice recognition software. Similar sounding words can inadvertently be transcribed and this note may contain transcription errors which may not have been corrected upon publication of note.**

## 2014-06-04 MED ORDER — MAGNESIUM CITRATE PO SOLN
0.5000 | Freq: Once | ORAL | Status: AC
  Administered 2014-06-04: 0.5 via ORAL
  Filled 2014-06-04: qty 296

## 2014-06-04 NOTE — Progress Notes (Signed)
Subjective: 3 Days Post-Op Procedure(s) (LRB): LEFT HIP HEMI ARTHROPLASTY  (Left) Patient reports pain as mild.  States had been bed to chair today has not worked with PT.  Max assist bed mobility.  Objective: Vital signs in last 24 hours: Temp:  [97.5 F (36.4 C)-98.7 F (37.1 C)] 98.7 F (37.1 C) (12/25 0954) Pulse Rate:  [69-107] 107 (12/25 0954) Resp:  [18] 18 (12/25 1200) BP: (111-121)/(51-64) 111/64 mmHg (12/25 0954) SpO2:  [98 %-99 %] 99 % (12/25 1200)  Intake/Output from previous day: 12/24 0701 - 12/25 0700 In: 240 [P.O.:240] Out: 500 [Urine:500] Intake/Output this shift: Total I/O In: 120 [P.O.:120] Out: 375 [Urine:375]   Recent Labs  06/02/14 0241 06/03/14 0446  HGB 11.3* 11.2*    Recent Labs  06/02/14 0241 06/03/14 0446  WBC 6.3 10.2  RBC 3.76* 3.72*  HCT 34.5* 34.0*  PLT 207 183    Recent Labs  06/02/14 0241 06/03/14 0446  NA 138 133*  K 3.7 3.9  CL 104 102  CO2 29 26  BUN 28* 38*  CREATININE 1.28 1.37*  GLUCOSE 119* 103*  CALCIUM 8.4 8.5   No results for input(s): LABPT, INR in the last 72 hours.  Neurovascular intact Sensation intact distally Intact pulses distally Dorsiflexion/Plantar flexion intact Incision: dressing C/D/I No cellulitis present  Assessment/Plan: 3 Days Post-Op Procedure(s) (LRB): LEFT HIP HEMI ARTHROPLASTY  (Left) Up with therapy  WBAT LLE, post hip dislocation precautions dvt proph ASA Pain control as ordered D/C planning likely home with [facility] will continue to assess but do not feel patient is ready at this time as he does live alone and requiring max assist at times    [name] 06/04/2014, 1:53 PM

## 2014-06-04 NOTE — Progress Notes (Signed)
Patient Demographics  Justin Orozco, is Orozco 80 y.o. male, DOB - 1926/11/23, MAU:[id number]  Admit date - 05/31/2014   Admitting Physician Justin Mutton Kristeen Mans, MD  Outpatient Primary MD for the patient is Justin A, MD  LOS - 4   No chief complaint on file.     Admission history of present illness/brief narrative: With Orozco history of complete heart block status post pacemaker placement in March 2015, paroxysmal atrial flutter, heart failure, but presented as Orozco direct admission from Dr. Christia Reading Orozco's office. Patient was seen for  left hip pain for approximately 2 days. Patient denied any trauma, injury, fall. Patient does have Orozco history of right hip fracture with hemiarthroplasty in 2012. Patient does use Orozco walker to aid in ambulation. Patient does state he had pacemaker placed in March 2015, and follows up with Dr. Gwenlyn Orozco. Patient denies any chest pain, shortness of breath, recent illness or travel upon admission. Patient underwent left hip hemiarthroplasty by Dr. Edmonia Orozco on 12/22 which he tolerated very well.  Subjective:   Justin Orozco today has, No headache, No chest pain, No abdominal pain - No Nausea, No new weakness tingling or numbness, No Cough - SOB. Complaints of mild left hip pain.  Assessment & Plan    Principal Problem:   Closed left hip fracture Active Problems:   CHF (congestive heart failure)   Lower extremity edema   Paroxysmal atrial flutter   Pacemaker   Hip fracture   Chronic combined systolic and diastolic congestive heart failure  Left  hip fracture -Patient directly admitted to telemetry from Dr. Octavia Bruckner Orozco's office -Left hip hemiarthroplasty 06/01/2014 -PT and OT . - SCDs and 325 mg aspirin for DVT prophylaxis.  Chronic combined systolic and diastolic heart failure -Echo in March 2015 shows EF of 40-45%, grade 1  diastolic dysfunction -Currently appears compensated -Continue home medications of metoprolol,  -Will monitor patient's intake and output as well as daily weights - Hold hydrochlorothiazide giving mild hyponatremia.  History of paroxysmal atrial flutter -Currently rate and appears to be rhythm controlled -Continue metoprolol  History of complete heart block -Patient has dual-chamber pacemaker, implanted 08/2013 - Monitor on telemetry  History of prostate cancer -Patient did receive radiation  Chronic lower extremity edema -Per patient, appears to be at baseline  Code Status: Full  Family Communication: Son at bedside  Disposition Plan: Remains in telemetry, very likely will need subacute rehab.    Procedures  Left hip hemiarthroplasty 12/22   Consults   Orthopedic Dr. Edmonia Orozco   Medications  Scheduled Meds: . aspirin EC  325 mg Oral Q breakfast  . docusate sodium  100 mg Oral BID  . feeding supplement (ENSURE COMPLETE)  237 mL Oral BID BM  . hydrALAZINE  10 mg Oral BID  . metoprolol  50 mg Oral Daily  . potassium chloride  10 mEq Oral Daily   Continuous Infusions:   PRN Meds:.acetaminophen **OR** acetaminophen, bisacodyl, HYDROcodone-acetaminophen, menthol-cetylpyridinium **OR** phenol, morphine injection, polyethylene glycol, senna-docusate  DVT Prophylaxis  SCDs, and aspirin 325 mg.  Lab Results  Component Value Date   PLT 183 06/03/2014    Antibiotics    Anti-infectives    Start     Dose/Rate Route Frequency Ordered Stop  06/01/14 1600  ceFAZolin (ANCEF) IVPB 2 g/50 mL premix     2 g100 mL/hr over 30 Minutes Intravenous Every 6 hours 06/01/14 1341 06/01/14 2203   06/01/14 0600  ceFAZolin (ANCEF) IVPB 2 g/50 mL premix  Status:  Discontinued     2 g100 mL/hr over 30 Minutes Intravenous On call to O.R. 05/31/14 1507 06/01/14 1338   06/01/14 0600  ceFAZolin (ANCEF) IVPB 2 g/50 mL premix     2 g100 mL/hr over 30 Minutes Intravenous On call to O.R.  05/31/14 1934 06/01/14 1015          Objective:   Filed Vitals:   06/04/14 0442 06/04/14 0800 06/04/14 0954 06/04/14 1200  BP: 121/55  111/64   Pulse: 81  107   Temp: 97.5 F (36.4 C)  98.7 F (37.1 C)   TempSrc: Oral  Oral   Resp: 18 18 18 18   Height:      Weight:      SpO2: 98% 98% 99% 99%    Wt Readings from Last 3 Encounters:  06/01/14 59.194 kg (130 lb 8 oz)  02/01/14 60.328 kg (133 lb)  01/01/14 61.326 kg (135 lb 3.2 oz)     Intake/Output Summary (Last 24 hours) at 06/04/14 1449 Last data filed at 06/04/14 1211  Gross per 24 hour  Intake    120 ml  Output    675 ml  Net   -555 ml     Physical Exam  Awake Alert, Oriented X 3, No new F.N deficits, Normal affect .AT,PERRAL Supple Neck,No JVD, No cervical lymphadenopathy appriciated.  Symmetrical Chest wall movement, Good air movement bilaterally, CTAB RRR,No Gallops,Rubs or new Murmurs, No Parasternal Heave +ve B.Sounds, Abd Soft, No tenderness, No organomegaly appriciated, No rebound - guarding or rigidity. No Cyanosis, Clubbing or edema, No new Rash or bruise , good pulses bilaterally   Data Review   Micro Results Recent Results (from the past 240 hour(s))  Surgical pcr screen     Status: None   Collection Time: 05/31/14  9:36 PM  Result Value Ref Range Status   MRSA, PCR NEGATIVE NEGATIVE Final   Staphylococcus aureus NEGATIVE NEGATIVE Final    Comment:        The Xpert SA Assay (FDA approved for NASAL specimens in patients over 22 years of age), is one component of Orozco comprehensive surveillance program.  Test performance has been validated by [employer] for patients greater than or equal to 105 year old. It is not intended to diagnose infection nor to guide or monitor treatment.     Radiology Reports No results Orozco.  CBC  Recent Labs Lab 05/31/14 1620 06/02/14 0241 06/03/14 0446  WBC 6.6 6.3 10.2  HGB 13.3 11.3* 11.2*  HCT 40.4 34.5* 34.0*  PLT 224 207 183  MCV 91.0  91.8 91.4  MCH 30.0 30.1 30.1  MCHC 32.9 32.8 32.9  RDW 13.9 13.9 14.0  LYMPHSABS 1.1  --   --   MONOABS 0.7  --   --   EOSABS 0.1  --   --   BASOSABS 0.0  --   --     Chemistries   Recent Labs Lab 05/31/14 1620 05/31/14 2030 06/02/14 0241 06/03/14 0446  NA 141 140 138 133*  K 3.6* 3.7 3.7 3.9  CL 102 105 104 102  CO2 27 27 29 26   GLUCOSE 116* 143* 119* 103*  BUN 38* 40* 28* 38*  CREATININE 1.20 1.35 1.28 1.37*  CALCIUM 9.8  9.1 8.4 8.5  AST 30 23  --   --   ALT 17 13  --   --   ALKPHOS 83 70  --   --   BILITOT 0.8 0.4  --   --    ------------------------------------------------------------------------------------------------------------------ estimated creatinine clearance is 33 mL/min (by C-G formula based on Cr of 1.37). ------------------------------------------------------------------------------------------------------------------ No results for input(s): HGBA1C in the last 72 hours. ------------------------------------------------------------------------------------------------------------------ No results for input(s): CHOL, HDL, LDLCALC, TRIG, CHOLHDL, LDLDIRECT in the last 72 hours. ------------------------------------------------------------------------------------------------------------------ No results for input(s): TSH, T4TOTAL, T3FREE, THYROIDAB in the last 72 hours.  Invalid input(s): FREET3 ------------------------------------------------------------------------------------------------------------------ No results for input(s): VITAMINB12, FOLATE, FERRITIN, TIBC, IRON, RETICCTPCT in the last 72 hours.  Coagulation profile No results for input(s): INR, PROTIME in the last 168 hours.  No results for input(s): DDIMER in the last 72 hours.  Cardiac Enzymes No results for input(s): CKMB, TROPONINI, MYOGLOBIN in the last 168 hours.  Invalid input(s):  CK ------------------------------------------------------------------------------------------------------------------ Invalid input(s): POCBNP     Time Spent in minutes   30 minutes   Laiklynn Raczynski M.D on 06/04/2014 at 2:49 PM  Between 7am to 7pm - Pager - [phone]  After 7pm go to [email or web address] - password [email or web address]  And look for the night coverage person covering for me after hours  [facility] Office  [phone]   **Disclaimer: This note may have been dictated with voice recognition software. Similar sounding words can inadvertently be transcribed and this note may contain transcription errors which may not have been corrected upon publication of note.**

## 2014-06-04 NOTE — Progress Notes (Addendum)
Physical Therapy Treatment Patient Details Name: Justin Orozco MRN: [id number] DOB: Nov 18, 1926 Today's Date: 06/04/2014    History of Present Illness 80 y.o. male s/p LEFT HIP HEMI ARTHROPLASTY  following hip fracture.   history of complete heart block status post pacemaker placement in March 2015, paroxysmal atrial flutter, and heart failure.    PT Comments    Patient required min to mod assist for mobility and gait today.  Patient with posterior lean/loss of balance during stance/gait requiring assist to prevent falls.  Mod assist required to get out of chair and off of BSC.  Patient unable to recall posterior hip precautions, and needs cuing to maintain precautions during mobility.  Patient lives alone and will need to function at Mod I level with all mobility, gait, and ADL's to return home safely.  Agree with recommendation for Inpatient Rehab at discharge to return patient to highest functional level with goal of returning home alone.   Follow Up Recommendations  CIR;Supervision/Assistance - 24 hour     Equipment Recommendations  3in1 (PT)    Recommendations for Other Services Rehab consult     Precautions / Restrictions Precautions Precautions: Posterior Hip;Fall Precaution Comments: Reviewed posterior hip precautions. Restrictions Weight Bearing Restrictions: Yes LLE Weight Bearing: Weight bearing as tolerated    Mobility  Bed Mobility               General bed mobility comments: Patient in chair as PT entered room  Transfers Overall transfer level: Needs assistance Equipment used: Rolling walker (2 wheeled) Transfers: Sit to/from Stand Sit to Stand: Mod assist         General transfer comment: Verbal cues for hand placement.  Patient requires mod assist to rise to standing from recliner and from 3-in-1.  Repeated verbal cues for placement of LLE to move stand > sit to maintain hip precautions.  Once upright, patient requires mod assist for standing  balance for at least 20 seconds, then min assist due to patient leaning posteriorly.  Cues for walker placement to shift weight forward.  Ambulation/Gait Ambulation/Gait assistance: Min assist;Mod assist Ambulation Distance (Feet): 60 Feet Assistive device: Rolling walker (2 wheeled) Gait Pattern/deviations: Step-to pattern;Decreased stance time - left;Decreased step length - left;Decreased step length - right;Decreased weight shift to left;Shuffle;Leaning posteriorly;Trunk flexed Gait velocity: decreased Gait velocity interpretation: Below normal speed for age/gender General Gait Details: Verbal cues for safe use of RW and gait sequence.  Cues to step-through with LLE and to place Lt foot flat on floor.  Patient walking on toes on Lt.  Patient leaning posteriorly during gait, at times requiring mod assist to prevent fall.  Otherwise, min assist.  Verbal cues for safe turns.  Encouraged patient to turn toward right side if possible to maintain hip precautions.  Required assist with RW placement during turns and cues to move in small steps during turns for safety/balance.   Stairs            Wheelchair Mobility    Modified Rankin (Stroke Patients Only)       Balance           Standing balance support: Bilateral upper extremity supported Standing balance-Leahy Scale: Poor Standing balance comment: Patient with posterior lean.  Needs assist to move RW forward to improve balance.  Unable to maintain balance with one hand on RW, increasing fall risk during functional tasks (see OT note).  Cognition Arousal/Alertness: Awake/alert Behavior During Therapy: WFL for tasks assessed/performed   Area of Impairment: Memory;Safety/judgement     Memory: Decreased recall of precautions;Decreased short-term memory   Safety/Judgement: Decreased awareness of safety;Decreased awareness of deficits          Exercises      General Comments        Pertinent  Vitals/Pain Pain Assessment: 0-10 Pain Score: 3  Pain Location: Lt hip Pain Descriptors / Indicators: Aching;Sore Pain Intervention(s): Limited activity within patient's tolerance;Repositioned    Home Living                      Prior Function            PT Goals (current goals can now be found in the care plan section) Progress towards PT goals: Progressing toward goals    Frequency  Min 5X/week    PT Plan Current plan remains appropriate    Co-evaluation             End of Session Equipment Utilized During Treatment: Gait belt Activity Tolerance: Patient limited by fatigue Patient left: in chair;with call bell/phone within reach;with family/visitor present     Time: 8127-5170 PT Time Calculation (min) (ACUTE ONLY): 36 min  Charges:  $Gait Training: 23-37 mins                    G Codes:      Despina Pole Jun 17, 2014, 4:05 PM Carita Pian. Sanjuana Kava, [place] Pager [phone]

## 2014-06-05 DIAGNOSIS — S72002S Fracture of unspecified part of neck of left femur, sequela: Secondary | ICD-10-CM

## 2014-06-05 LAB — BASIC METABOLIC PANEL
Anion gap: 6 (ref 5–15)
BUN: 40 mg/dL — ABNORMAL HIGH (ref 6–23)
CALCIUM: 8.6 mg/dL (ref 8.4–10.5)
CO2: 29 mmol/L (ref 19–32)
CREATININE: 1.27 mg/dL (ref 0.50–1.35)
Chloride: 103 mEq/L (ref 96–112)
GFR calc Af Amer: 58 mL/min — ABNORMAL LOW (ref >90)
GFR calc non Af Amer: 50 mL/min — ABNORMAL LOW (ref >90)
GLUCOSE: 107 mg/dL — AB (ref 70–99)
Potassium: 4.2 mmol/L (ref 3.5–5.1)
Sodium: 138 mmol/L (ref 135–145)

## 2014-06-05 LAB — CBC
HCT: 30.1 % — ABNORMAL LOW (ref 39.0–52.0)
HEMOGLOBIN: 10 g/dL — AB (ref 13.0–17.0)
MCH: 30.2 pg (ref 26.0–34.0)
MCHC: 33.2 g/dL (ref 30.0–36.0)
MCV: 90.9 fL (ref 78.0–100.0)
Platelets: 234 10*3/uL (ref 150–400)
RBC: 3.31 MIL/uL — AB (ref 4.22–5.81)
RDW: 14 % (ref 11.5–15.5)
WBC: 7.6 10*3/uL (ref 4.0–10.5)

## 2014-06-05 NOTE — Progress Notes (Signed)
SPORTS MEDICINE AND JOINT REPLACEMENT  Justin Mulch, MD   Justin Spry, PA-C Justin Orozco, Justin Orozco, [place]  [id number]                             [phone]   PROGRESS NOTE  Subjective:  negative for Chest Pain  negative for Shortness of Breath  negative for Nausea/Vomiting   negative for Calf Pain  negative for Bowel Movement   Tolerating Diet: yes         Patient reports pain as 4 on 0-10 scale.    Objective: Vital signs in last 24 hours:   Patient Vitals for the past 24 hrs:  BP Temp Temp src Pulse Resp SpO2  06/05/14 0441 (!) 112/45 mmHg 98.5 F (36.9 C) Oral 72 18 95 %  06/05/14 0400 - - - - 18 95 %  06/05/14 0000 - - - - 18 98 %  06/04/14 2013 (!) 114/56 mmHg 98.7 F (37.1 C) Oral 83 17 99 %  06/04/14 1939 - - - - 16 98 %  06/04/14 1600 - - - - 16 98 %  06/04/14 1547 (!) 120/45 mmHg 98.2 F (36.8 C) Oral 68 16 98 %  06/04/14 1200 - - - - 18 99 %  06/04/14 0954 111/64 mmHg 98.7 F (37.1 C) Oral (!) 107 18 99 %    @flow {1959:LAST@   Intake/Output from previous day:   12/25 0701 - 12/26 0700 In: 1280 [P.O.:1280] Out: 970 [Urine:970]   Intake/Output this shift:       Intake/Output      12/25 0701 - 12/26 0700 12/26 0701 - 12/27 0700   P.O. 1280    Total Intake(mL/kg) 1280 (21.6)    Urine (mL/kg/hr) 970 (0.7)    Total Output 970     Net +310             LABORATORY DATA:  Recent Labs  05/31/14 1620 06/02/14 0241 06/03/14 0446 06/05/14 0300  WBC 6.6 6.3 10.2 7.6  HGB 13.3 11.3* 11.2* 10.0*  HCT 40.4 34.5* 34.0* 30.1*  PLT 224 207 183 234    Recent Labs  05/31/14 1620 05/31/14 2030 06/02/14 0241 06/03/14 0446 06/05/14 0300  NA 141 140 138 133* 138  K 3.6* 3.7 3.7 3.9 4.2  CL 102 105 104 102 103  CO2 27 27 29 26 29   BUN 38* 40* 28* 38* 40*  CREATININE 1.20 1.35 1.28 1.37* 1.27  GLUCOSE 116* 143* 119* 103* 107*  CALCIUM 9.8 9.1 8.4 8.5 8.6   Lab Results  Component Value Date   INR 1.55* 09/11/2010   INR 1.11 09/10/2010    INR 1.05 09/05/2010    Examination:  General appearance: alert, cooperative and no distress Extremities: Homans sign is negative, no sign of DVT  Wound Exam: clean, dry, intact   Drainage:  Scant/small amount Serosanguinous exudate  Motor Exam: EHL and FHL Intact  Sensory Exam: Deep Peroneal normal   Assessment:    4 Days Post-Op  Procedure(s) (LRB): LEFT HIP HEMI ARTHROPLASTY  (Left)  ADDITIONAL DIAGNOSIS:  Principal Problem:   Closed left hip fracture Active Problems:   CHF (congestive heart failure)   Lower extremity edema   Paroxysmal atrial flutter   Pacemaker   Hip fracture   Chronic combined systolic and diastolic congestive heart failure  Acute Blood Loss Anemia   Plan: Physical Therapy as ordered Weight Bearing as Tolerated (WBAT)  DVT  Prophylaxis  DISCHARGE PLAN: Home vs SNF           Justin Orozco 06/05/2014, 9:19 AM

## 2014-06-05 NOTE — Progress Notes (Signed)
Physical Therapy Treatment Patient Details Name: Justin Orozco MRN: [id number] DOB: [birth year] Today's Date: 06/05/2014    History of Present Illness 80 y.o. male s/p LEFT HIP HEMI ARTHROPLASTY  following hip fracture.   history of complete heart block status post pacemaker placement in March 2015, paroxysmal atrial flutter, and heart failure.    PT Comments    Patient continues to progress towards physical therapy goals. Requires frequent cues for safe mobility, with min assist for transfers and ambulation due to posterior loss of balance. Tolerated exercises fairly well but continues to have difficulty with pain management. Patient will continue to benefit from skilled physical therapy services to further improve independence with functional mobility.  Follow Up Recommendations  CIR;Supervision/Assistance - 24 hour     Equipment Recommendations  3in1 (PT)    Recommendations for Other Services Rehab consult     Precautions / Restrictions Precautions Precautions: Posterior Hip;Fall Precaution Booklet Issued: Yes (comment) Precaution Comments: Pt unable to recall THA precautions.  Reviewed posterior hip precautions. . Restrictions Weight Bearing Restrictions: Yes LLE Weight Bearing: Weight bearing as tolerated    Mobility  Bed Mobility                  Transfers Overall transfer level: Needs assistance Equipment used: Rolling walker (2 wheeled) Transfers: Sit to/from Stand Sit to Stand: Min assist Stand pivot transfers: Min assist       General transfer comment: Min assist for boost to stand from reclining chair. Max VC for technique and needs repeated for positioning to prevent breaking of hip precautions. Took Pt 3 attempts. Leans posteriorly intially requiring min assist to correct.  Ambulation/Gait Ambulation/Gait assistance: Min assist Ambulation Distance (Feet): 85 Feet Assistive device: Rolling walker (2 wheeled) Gait Pattern/deviations: Step-to  pattern;Step-through pattern;Decreased step length - right;Decreased stance time - left;Antalgic;Decreased dorsiflexion - left;Trunk flexed Gait velocity: decreased   General Gait Details: Instructions focusing on symmetry of gait with Lt heel stirke and step through gait pattern. VC for sequencing intermittently. required one standing rest break to complete distance. Reported increase in pain limiting further distance. Min assist initially for loss of balance to posterior.   Stairs            Wheelchair Mobility    Modified Rankin (Stroke Patients Only)       Balance Overall balance assessment: Needs assistance Sitting-balance support: Feet supported Sitting balance-Leahy Scale: Good     Standing balance support: During functional activity Standing balance-Leahy Scale: Fair                      Cognition Arousal/Alertness: Awake/alert Behavior During Therapy: WFL for tasks assessed/performed Overall Cognitive Status: Impaired/Different from baseline Area of Impairment: Memory;Safety/judgement     Memory: Decreased recall of precautions;Decreased short-term memory   Safety/Judgement: Decreased awareness of safety     General Comments: Does not remember hip precautions. (any)    Exercises Total Joint Exercises Ankle Circles/Pumps: AROM;Both;10 reps;Seated Quad Sets: Strengthening;Both;10 reps;Seated Gluteal Sets: Strengthening;Both;10 reps;Seated Long Arc Quad: Strengthening;Both;10 reps;Seated Marching in Standing: AROM;Left;10 reps;Standing (poor tolerance)    General Comments        Pertinent Vitals/Pain Pain Assessment: 0-10 Pain Score:  ("Well it hurts when I move" no value given) Faces Pain Scale: Hurts a little bit Pain Location: Lt hip Pain Descriptors / Indicators: Aching Pain Intervention(s): Monitored during session;Repositioned    Home Living  Prior Function            PT Goals (current goals can  now be found in the care plan section) Acute Rehab PT Goals PT Goal Formulation: With patient Time For Goal Achievement: 06/16/14 Potential to Achieve Goals: Good Progress towards PT goals: Progressing toward goals    Frequency  Min 5X/week    PT Plan Current plan remains appropriate    Co-evaluation             End of Session Equipment Utilized During Treatment: Gait belt Activity Tolerance: Patient limited by pain Patient left: in chair;with call bell/phone within reach;with family/visitor present     Time: 7414-2395 PT Time Calculation (min) (ACUTE ONLY): 27 min  Charges:  $Gait Training: 8-22 mins $Therapeutic Exercise: 8-22 mins                    G Codes:      [name] June 24, 2014, 4:51 PM Camille Bal [place], [place]

## 2014-06-05 NOTE — Progress Notes (Signed)
Patient Demographics  Justin Orozco, is Orozco 80 y.o. male, DOB - 1927-03-02, VOH:[id number]  Admit date - 05/31/2014   Admitting Physician Justin Mutton Kristeen Mans, MD  Outpatient Primary MD for the patient is Justin A, MD  LOS - 5   No chief complaint on file.     Admission history of present illness/brief narrative: With Orozco history of complete heart block status post pacemaker placement in March 2015, paroxysmal atrial flutter, heart failure, but presented as Orozco direct admission from Dr. Christia Reading Orozco's office. Patient was seen for  left hip pain for approximately 2 days. Patient denied any trauma, injury, fall. Patient does have Orozco history of right hip fracture with hemiarthroplasty in 2012. Patient does use Orozco walker to aid in ambulation. Patient does state he had pacemaker placed in March 2015, and follows up with Dr. Gwenlyn Orozco. Patient denies any chest pain, shortness of breath, recent illness or travel upon admission. Patient underwent left hip hemiarthroplasty by Dr. Edmonia Orozco on 12/22 which he tolerated very well.  Subjective:   [name] today has, No headache, No chest pain, No abdominal pain - No Nausea, No new weakness tingling or numbness, No Cough - SOB. Complaints of mild left hip pain.  Assessment & Plan    Principal Problem:   Closed left hip fracture Active Problems:   CHF (congestive heart failure)   Lower extremity edema   Paroxysmal atrial flutter   Pacemaker   Hip fracture   Chronic combined systolic and diastolic congestive heart failure  Left  hip fracture -Patient directly admitted to telemetry from Dr. Octavia Bruckner Orozco's office -Left hip hemiarthroplasty 06/01/2014 -PT and OT . - SCDs and 325 mg aspirin for DVT prophylaxis.  Chronic combined systolic and diastolic heart failure -Echo in March 2015 shows EF of 40-45%, grade 1  diastolic dysfunction -Currently appears compensated -Continue home medications of metoprolol,  -Will monitor patient's intake and output as well as daily weights - Hold hydrochlorothiazide giving mild hyponatremia.  History of paroxysmal atrial flutter -Currently rate and appears to be rhythm controlled -Continue metoprolol  History of complete heart block -Patient has dual-chamber pacemaker, implanted 08/2013 - Monitor on telemetry  History of prostate cancer -Patient did receive radiation  Chronic lower extremity edema -Per patient, appears to be at baseline  Code Status: Full  Family Communication: Son at bedside  Disposition Plan: Discharge to subacute rehabilitation when bed is available   Procedures  Left hip hemiarthroplasty 12/22   Consults   Orthopedic Dr. Edmonia Orozco   Medications  Scheduled Meds: . aspirin EC  325 mg Oral Q breakfast  . docusate sodium  100 mg Oral BID  . feeding supplement (ENSURE COMPLETE)  237 mL Oral BID BM  . hydrALAZINE  10 mg Oral BID  . metoprolol  50 mg Oral Daily  . potassium chloride  10 mEq Oral Daily   Continuous Infusions:   PRN Meds:.acetaminophen **OR** acetaminophen, bisacodyl, HYDROcodone-acetaminophen, menthol-cetylpyridinium **OR** phenol, morphine injection, polyethylene glycol, senna-docusate  DVT Prophylaxis  SCDs, and aspirin 325 mg.  Lab Results  Component Value Date   PLT 234 06/05/2014    Antibiotics    Anti-infectives    Start     Dose/Rate Route Frequency Ordered Stop   06/01/14  1600  ceFAZolin (ANCEF) IVPB 2 g/50 mL premix     2 g100 mL/hr over 30 Minutes Intravenous Every 6 hours 06/01/14 1341 06/01/14 2203   06/01/14 0600  ceFAZolin (ANCEF) IVPB 2 g/50 mL premix  Status:  Discontinued     2 g100 mL/hr over 30 Minutes Intravenous On call to O.R. 05/31/14 1507 06/01/14 1338   06/01/14 0600  ceFAZolin (ANCEF) IVPB 2 g/50 mL premix     2 g100 mL/hr over 30 Minutes Intravenous On call to O.R.  05/31/14 1934 06/01/14 1015          Objective:   Filed Vitals:   06/05/14 0000 06/05/14 0400 06/05/14 0441 06/05/14 1019  BP:   112/45 108/44  Pulse:   72 79  Temp:   98.5 F (36.9 C)   TempSrc:   Oral   Resp: 18 18 18 18   Height:      Weight:      SpO2: 98% 95% 95% 98%    Wt Readings from Last 3 Encounters:  06/01/14 59.194 kg (130 lb 8 oz)  02/01/14 60.328 kg (133 lb)  01/01/14 61.326 kg (135 lb 3.2 oz)     Intake/Output Summary (Last 24 hours) at 06/05/14 1316 Last data filed at 06/05/14 1007  Gross per 24 hour  Intake   1400 ml  Output    595 ml  Net    805 ml     Physical Exam  Awake Alert, Oriented X 3, No new F.N deficits, Normal affect Boyd.AT,PERRAL Supple Neck,No JVD, No cervical lymphadenopathy appriciated.  Symmetrical Chest wall movement, Good air movement bilaterally, CTAB RRR,No Gallops,Rubs or new Murmurs, No Parasternal Heave +ve B.Sounds, Abd Soft, No tenderness, No organomegaly appriciated, No rebound - guarding or rigidity. No Cyanosis, Clubbing or edema, No new Rash or bruise , good pulses bilaterally   Data Review   Micro Results Recent Results (from the past 240 hour(s))  Surgical pcr screen     Status: None   Collection Time: 05/31/14  9:36 PM  Result Value Ref Range Status   MRSA, PCR NEGATIVE NEGATIVE Final   Staphylococcus aureus NEGATIVE NEGATIVE Final    Comment:        The Xpert SA Assay (FDA approved for NASAL specimens in patients over 97 years of age), is one component of Orozco comprehensive surveillance program.  Test performance has been validated by [employer] for patients greater than or equal to 26 year old. It is not intended to diagnose infection nor to guide or monitor treatment.     Radiology Reports No results Orozco.  CBC  Recent Labs Lab 05/31/14 1620 06/02/14 0241 06/03/14 0446 06/05/14 0300  WBC 6.6 6.3 10.2 7.6  HGB 13.3 11.3* 11.2* 10.0*  HCT 40.4 34.5* 34.0* 30.1*  PLT 224 207 183 234   MCV 91.0 91.8 91.4 90.9  MCH 30.0 30.1 30.1 30.2  MCHC 32.9 32.8 32.9 33.2  RDW 13.9 13.9 14.0 14.0  LYMPHSABS 1.1  --   --   --   MONOABS 0.7  --   --   --   EOSABS 0.1  --   --   --   BASOSABS 0.0  --   --   --     Chemistries   Recent Labs Lab 05/31/14 1620 05/31/14 2030 06/02/14 0241 06/03/14 0446 06/05/14 0300  NA 141 140 138 133* 138  K 3.6* 3.7 3.7 3.9 4.2  CL 102 105 104 102 103  CO2 27 27  29 26 29   GLUCOSE 116* 143* 119* 103* 107*  BUN 38* 40* 28* 38* 40*  CREATININE 1.20 1.35 1.28 1.37* 1.27  CALCIUM 9.8 9.1 8.4 8.5 8.6  AST 30 23  --   --   --   ALT 17 13  --   --   --   ALKPHOS 83 70  --   --   --   BILITOT 0.8 0.4  --   --   --    ------------------------------------------------------------------------------------------------------------------ estimated creatinine clearance is 35.6 mL/min (by C-G formula based on Cr of 1.27). ------------------------------------------------------------------------------------------------------------------ No results for input(s): HGBA1C in the last 72 hours. ------------------------------------------------------------------------------------------------------------------ No results for input(s): CHOL, HDL, LDLCALC, TRIG, CHOLHDL, LDLDIRECT in the last 72 hours. ------------------------------------------------------------------------------------------------------------------ No results for input(s): TSH, T4TOTAL, T3FREE, THYROIDAB in the last 72 hours.  Invalid input(s): FREET3 ------------------------------------------------------------------------------------------------------------------ No results for input(s): VITAMINB12, FOLATE, FERRITIN, TIBC, IRON, RETICCTPCT in the last 72 hours.  Coagulation profile No results for input(s): INR, PROTIME in the last 168 hours.  No results for input(s): DDIMER in the last 72 hours.  Cardiac Enzymes No results for input(s): CKMB, TROPONINI, MYOGLOBIN in the last 168  hours.  Invalid input(s): CK ------------------------------------------------------------------------------------------------------------------ Invalid input(s): POCBNP     Time Spent in minutes   30 minutes   ELGERGAWY, DAWOOD M.D on 06/05/2014 at 1:16 PM  Between 7am to 7pm - Pager - [phone]  After 7pm go to [email or web address] - password [email or web address]  And look for the night coverage person covering for me after hours  [facility] Office  [phone]   **Disclaimer: This note may have been dictated with voice recognition software. Similar sounding words can inadvertently be transcribed and this note may contain transcription errors which may not have been corrected upon publication of note.**

## 2014-06-05 NOTE — Progress Notes (Signed)
Occupational Therapy Treatment Patient Details Name: Justin Orozco MRN: [id number] DOB: 1927-05-27 Today's Date: 06/05/2014    History of present illness 80 y.o. male s/p LEFT HIP HEMI ARTHROPLASTY  following hip fracture.   history of complete heart block status post pacemaker placement in March 2015, paroxysmal atrial flutter, and heart failure.   OT comments  Pt is improving with ability to perform BADLs.  Currently, he requires min A for LB ADLs and toilet transfers,  Min guard assist for grooming standing at sink.  He was unable to recall THA precautions and requires mod verbal cues to adhere to them during session.   Follow Up Recommendations  Home health OT;Supervision/Assistance - 24 hour    Equipment Recommendations  3 in 1 bedside comode    Recommendations for Other Services      Precautions / Restrictions Precautions Precautions: Posterior Hip;Fall Precaution Booklet Issued: Yes (comment) Precaution Comments: Pt unable to recall THA precautions.  Reviewed posterior hip precautions. . Restrictions Weight Bearing Restrictions: Yes LLE Weight Bearing: Weight bearing as tolerated       Mobility Bed Mobility                  Transfers Overall transfer level: Needs assistance Equipment used: Rolling walker (2 wheeled) Transfers: Sit to/from [employer] Transfers Sit to Stand: Min assist Stand pivot transfers: Min assist       General transfer comment: verbal cues for hand placement and assist to prevent pt from leaning too far forward.  Pt requires assist to extend Lt. LE in front of him when transitioning sit <> stand     Balance Overall balance assessment: Needs assistance Sitting-balance support: Feet supported Sitting balance-Leahy Scale: Good     Standing balance support: During functional activity Standing balance-Leahy Scale: Fair                     ADL Overall ADL's : Needs assistance/impaired     Grooming: Wash/dry  hands;Brushing hair;Min guard;Standing       Lower Body Bathing: Minimal assistance;With adaptive equipment;Sit to/from stand       Lower Body Dressing: Sit to/from stand;Minimal assistance Lower Body Dressing Details (indicate cue type and reason): Pt able to demonstrate use of AE for LB ADLs.  Pt requires min A to pull pants over hips.  Verbal cues required to prevent pt bending too far  Toilet Transfer: Ambulation;Comfort height toilet;BSC;Minimal assistance Toilet Transfer Details (indicate cue type and reason): cues for hand placement and correct technique Toileting- Clothing Manipulation and Hygiene: Sit to/from stand;Minimal assistance Toileting - Clothing Manipulation Details (indicate cue type and reason): Pt attempts to bend forward to access peri area.  Cued pt to lean to side and keep chest up.  He was able to perform peri care with min guard assist and min A to pull pants over hips      Functional mobility during ADLs: Rolling walker;Minimal assistance General ADL Comments: Daughter present during OT session. Pt requires supervision and cues to adhere to THA precautions       Vision                     Perception     Praxis      Cognition   Behavior During Therapy: [facility] for tasks assessed/performed Overall Cognitive Status: Impaired/Different from baseline Area of Impairment: Memory;Safety/judgement     Memory: Decreased recall of precautions;Decreased short-term memory    Safety/Judgement: Decreased awareness of safety;Decreased awareness  of deficits     General Comments: Pt unablet to recall THA precautions    Extremity/Trunk Assessment               Exercises     Shoulder Instructions       General Comments      Pertinent Vitals/ Pain       Pain Assessment: Faces Faces Pain Scale: Hurts a little bit Pain Location: Lt. hip Pain Descriptors / Indicators: Aching Pain Intervention(s): Monitored during session  Home Living                                           Prior Functioning/Environment              Frequency Min 2X/week     Progress Toward Goals  OT Goals(current goals can now be found in the care plan section)  Progress towards OT goals: Progressing toward goals  ADL Goals Pt Will Perform Grooming: with supervision;standing Pt Will Perform Lower Body Bathing: with supervision;sit to/from stand;with adaptive equipment Pt Will Perform Lower Body Dressing: with supervision;with adaptive equipment;sit to/from stand Pt Will Transfer to Toilet: with supervision;ambulating;bedside commode Pt Will Perform Toileting - Clothing Manipulation and hygiene: with supervision;sit to/from stand Additional ADL Goal #1: Pt will state and generalize posterior hip precautions in ADL and mobility. Additional ADL Goal #2: Pt will perform bed mobility with supervision.  Plan Discharge plan remains appropriate    Co-evaluation                 End of Session Equipment Utilized During Treatment: Gait belt;Rolling walker   Activity Tolerance Patient tolerated treatment well   Patient Left in chair;with call bell/phone within reach;with family/visitor present   Nurse Communication Mobility status        Time: 6967-8938 OT Time Calculation (min): 47 min  Charges: OT General Charges $OT Visit: 1 Procedure OT Treatments $Self Care/Home Management : 23-37 mins $Therapeutic Activity: 8-22 mins  Steve Youngberg M 06/05/2014, 2:54 PM

## 2014-06-06 NOTE — Progress Notes (Signed)
Patient Demographics  Justin Orozco, is a 80 y.o. male, DOB - 1927-01-30, JGO:[id number]  Admit date - 05/31/2014   Admitting Physician [name], MD  Outpatient Primary MD for the patient is [name],[name], MD  LOS - 6   No chief complaint on file.     Admission history of present illness/brief narrative: With a history of complete heart block status post pacemaker placement in March 2015, paroxysmal atrial flutter, heart failure, but presented as a direct admission from [name]'s office. Patient was seen for  left hip pain for approximately 2 days. Patient denied any trauma, injury, fall. Patient does have a history of right hip fracture with hemiarthroplasty in 2012. Patient does use a walker to aid in ambulation. Patient does state he had pacemaker placed in March 2015, and follows up with Dr. [name]. Patient denies any chest pain, shortness of breath, recent illness or travel upon admission. Patient underwent left hip hemiarthroplasty by [name] on 12/22 which he tolerated very well.  Subjective:   Justin Orozco today has, No headache, No chest pain, No abdominal pain - No Nausea, No new weakness tingling or numbness, No Cough - SOB. Complaints of mild left hip pain.  Assessment & Plan    Principal Problem:   Closed left hip fracture Active Problems:   CHF (congestive heart failure)   Lower extremity edema   Paroxysmal atrial flutter   Pacemaker   Hip fracture   Chronic combined systolic and diastolic congestive heart failure  Left  hip fracture -Patient directly admitted to telemetry from Dr. [name]'s office -Left hip hemiarthroplasty 06/01/2014 -PT and OT . - SCDs and 325 mg aspirin for DVT prophylaxis. - patientt is stable for discharge, awaiting bed availability,  Chronic combined systolic and diastolic heart  failure -Echo in March 2015 shows EF of 55-97%, grade 1 diastolic dysfunction -Currently appears compensated -Continue home medications of metoprolol,  -Will monitor patient's intake and output as well as daily weights - Hold hydrochlorothiazide giving mild hyponatremia.  History of paroxysmal atrial flutter -Currently rate and appears to be rhythm controlled -Continue metoprolol  History of complete heart block -Patient has dual-chamber pacemaker, implanted 08/2013 - Monitor on telemetry  History of prostate cancer -Patient did receive radiation  Chronic lower extremity edema -Per patient, appears to be at baseline  Code Status: Full  Family Communication: none at bedside  Disposition Plan: Discharge to subacute rehabilitation when bed is available, spoke with SW.   Procedures  Left hip hemiarthroplasty 12/22   Consults   Orthopedic [name]   Medications  Scheduled Meds: . aspirin EC  325 mg Oral Q breakfast  . docusate sodium  100 mg Oral BID  . feeding supplement (ENSURE COMPLETE)  237 mL Oral BID BM  . hydrALAZINE  10 mg Oral BID  . metoprolol  50 mg Oral Daily  . potassium chloride  10 mEq Oral Daily   Continuous Infusions:   PRN Meds:.acetaminophen **OR** acetaminophen, bisacodyl, HYDROcodone-acetaminophen, menthol-cetylpyridinium **OR** phenol, morphine injection, polyethylene glycol, senna-docusate  DVT Prophylaxis  SCDs, and aspirin 325 mg.  Lab Results  Component Value Date   PLT 234 06/05/2014    Antibiotics    Anti-infectives    Start  Dose/Rate Route Frequency Ordered Stop   06/01/14 1600  ceFAZolin (ANCEF) IVPB 2 g/50 mL premix     2 g100 mL/hr over 30 Minutes Intravenous Every 6 hours 06/01/14 1341 06/01/14 2203   06/01/14 0600  ceFAZolin (ANCEF) IVPB 2 g/50 mL premix  Status:  Discontinued     2 g100 mL/hr over 30 Minutes Intravenous On call to O.R. 05/31/14 1507 06/01/14 1338   06/01/14 0600  ceFAZolin (ANCEF) IVPB 2 g/50  mL premix     2 g100 mL/hr over 30 Minutes Intravenous On call to O.R. 05/31/14 1934 06/01/14 1015          Objective:   Filed Vitals:   06/05/14 1553 06/05/14 2014 06/06/14 0000 06/06/14 0442  BP:  119/45  123/49  Pulse:  70  67  Temp:  98.8 F (37.1 C)  98.5 F (36.9 C)  TempSrc:  Oral  Oral  Resp: 18 17 18 17   Height:      Weight:      SpO2: 99% 98% 98% 99%    Wt Readings from Last 3 Encounters:  06/01/14 59.194 kg (130 lb 8 oz)  02/01/14 60.328 kg (133 lb)  01/01/14 61.326 kg (135 lb 3.2 oz)     Intake/Output Summary (Last 24 hours) at 06/06/14 0956 Last data filed at 06/06/14 0700  Gross per 24 hour  Intake    480 ml  Output    600 ml  Net   -120 ml     Physical Exam  Awake Alert, Oriented X 3, No new F.N deficits, Normal affect [place].AT,PERRAL Supple Neck,No JVD, No cervical lymphadenopathy appriciated.  Symmetrical Chest wall movement, Good air movement bilaterally, CTAB RRR,No Gallops,Rubs or new Murmurs, No Parasternal Heave +ve B.Sounds, Abd Soft, No tenderness, No organomegaly appriciated, No rebound - guarding or rigidity. No Cyanosis, Clubbing or edema, No new Rash or bruise , good pulses bilaterally   Data Review   Micro Results Recent Results (from the past 240 hour(s))  Surgical pcr screen     Status: None   Collection Time: 05/31/14  9:36 PM  Result Value Ref Range Status   MRSA, PCR NEGATIVE NEGATIVE Final   Staphylococcus aureus NEGATIVE NEGATIVE Final    Comment:        The Xpert SA Assay (FDA approved for NASAL specimens in patients over 63 years of age), is one component of a comprehensive surveillance program.  Test performance has been validated by [employer] for patients greater than or equal to 49 year old. It is not intended to diagnose infection nor to guide or monitor treatment.     Radiology Reports No results found.  CBC  Recent Labs Lab 05/31/14 1620 06/02/14 0241 06/03/14 0446 06/05/14 0300  WBC 6.6  6.3 10.2 7.6  HGB 13.3 11.3* 11.2* 10.0*  HCT 40.4 34.5* 34.0* 30.1*  PLT 224 207 183 234  MCV 91.0 91.8 91.4 90.9  MCH 30.0 30.1 30.1 30.2  MCHC 32.9 32.8 32.9 33.2  RDW 13.9 13.9 14.0 14.0  LYMPHSABS 1.1  --   --   --   MONOABS 0.7  --   --   --   EOSABS 0.1  --   --   --   BASOSABS 0.0  --   --   --     Chemistries   Recent Labs Lab 05/31/14 1620 05/31/14 2030 06/02/14 0241 06/03/14 0446 06/05/14 0300  NA 141 140 138 133* 138  K 3.6* 3.7 3.7 3.9 4.2  CL 102 105 104 102 103  CO2 27 27 29 26 29   GLUCOSE 116* 143* 119* 103* 107*  BUN 38* 40* 28* 38* 40*  CREATININE 1.20 1.35 1.28 1.37* 1.27  CALCIUM 9.8 9.1 8.4 8.5 8.6  AST 30 23  --   --   --   ALT 17 13  --   --   --   ALKPHOS 83 70  --   --   --   BILITOT 0.8 0.4  --   --   --    ------------------------------------------------------------------------------------------------------------------ estimated creatinine clearance is 35.6 mL/min (by C-G formula based on Cr of 1.27). ------------------------------------------------------------------------------------------------------------------ No results for input(s): HGBA1C in the last 72 hours. ------------------------------------------------------------------------------------------------------------------ No results for input(s): CHOL, HDL, LDLCALC, TRIG, CHOLHDL, LDLDIRECT in the last 72 hours. ------------------------------------------------------------------------------------------------------------------ No results for input(s): TSH, T4TOTAL, T3FREE, THYROIDAB in the last 72 hours.  Invalid input(s): FREET3 ------------------------------------------------------------------------------------------------------------------ No results for input(s): VITAMINB12, FOLATE, FERRITIN, TIBC, IRON, RETICCTPCT in the last 72 hours.  Coagulation profile No results for input(s): INR, PROTIME in the last 168 hours.  No results for input(s): DDIMER in the last 72  hours.  Cardiac Enzymes No results for input(s): CKMB, TROPONINI, MYOGLOBIN in the last 168 hours.  Invalid input(s): CK ------------------------------------------------------------------------------------------------------------------ Invalid input(s): POCBNP     Time Spent in minutes   30 minutes   ELGERGAWY, DAWOOD M.D on 06/06/2014 at 9:56 AM  Between 7am to 7pm - Pager - [phone]  After 7pm go to [email or web address] - password [email or web address]  And look for the night coverage person covering for me after hours  [facility] Office  [phone]   **Disclaimer: This note may have been dictated with voice recognition software. Similar sounding words can inadvertently be transcribed and this note may contain transcription errors which may not have been corrected upon publication of note.**

## 2014-06-06 NOTE — Progress Notes (Signed)
SPORTS MEDICINE AND JOINT REPLACEMENT  Lara Mulch, MD   Carlynn Spry, PA-C [place], [place], [place]  [address]                             [phone]   PROGRESS NOTE  Subjective:  negative for Chest Pain  negative for Shortness of Breath  negative for Nausea/Vomiting   negative for Calf Pain  negative for Bowel Movement   Tolerating Diet: yes         Patient reports pain as 5 on 0-10 scale.    Objective: Vital signs in last 24 hours:   Patient Vitals for the past 24 hrs:  BP Temp Temp src Pulse Resp SpO2  06/06/14 0442 (!) 123/49 mmHg 98.5 F (36.9 C) Oral 67 17 99 %  06/06/14 0000 - - - - 18 98 %  06/05/14 2014 (!) 119/45 mmHg 98.8 F (37.1 C) Oral 70 17 98 %  06/05/14 1553 - - - - 18 99 %  06/05/14 1446 (!) 118/40 mmHg 98 F (36.7 C) Oral 67 18 99 %    @flow {1959:LAST@   Intake/Output from previous day:   12/26 0701 - 12/27 0700 In: 480 [P.O.:480] Out: 600 [Urine:600]   Intake/Output this shift:       Intake/Output      12/26 0701 - 12/27 0700 12/27 0701 - 12/28 0700   P.O. 480    Total Intake(mL/kg) 480 (8.1)    Urine (mL/kg/hr) 600 (0.4)    Total Output 600     Net -120             LABORATORY DATA:  Recent Labs  05/31/14 1620 06/02/14 0241 06/03/14 0446 06/05/14 0300  WBC 6.6 6.3 10.2 7.6  HGB 13.3 11.3* 11.2* 10.0*  HCT 40.4 34.5* 34.0* 30.1*  PLT 224 207 183 234    Recent Labs  05/31/14 1620 05/31/14 2030 06/02/14 0241 06/03/14 0446 06/05/14 0300  NA 141 140 138 133* 138  K 3.6* 3.7 3.7 3.9 4.2  CL 102 105 104 102 103  CO2 27 27 29 26 29   BUN 38* 40* 28* 38* 40*  CREATININE 1.20 1.35 1.28 1.37* 1.27  GLUCOSE 116* 143* 119* 103* 107*  CALCIUM 9.8 9.1 8.4 8.5 8.6   Lab Results  Component Value Date   INR 1.55* 09/11/2010   INR 1.11 09/10/2010   INR 1.05 09/05/2010    Examination:  General appearance: alert, cooperative and no distress Extremities: Homans sign is negative, no sign of DVT  Wound Exam:  clean, dry, intact   Drainage:  None: wound tissue dry  Motor Exam: EHL and FHL Intact  Sensory Exam: Deep Peroneal normal   Assessment:    5 Days Post-Op  Procedure(s) (LRB): LEFT HIP HEMI ARTHROPLASTY  (Left)  ADDITIONAL DIAGNOSIS:  Principal Problem:   Closed left hip fracture Active Problems:   CHF (congestive heart failure)   Lower extremity edema   Paroxysmal atrial flutter   Pacemaker   Hip fracture   Chronic combined systolic and diastolic congestive heart failure  Acute Blood Loss Anemia   Plan: Physical Therapy as ordered Weight Bearing as Tolerated (WBAT)  DVT Prophylaxis  HOME vs SNF         Sairah Knobloch 06/06/2014, 10:30 AM

## 2014-06-06 NOTE — Clinical Social Work Psychosocial (Signed)
Clinical Social Work Department BRIEF PSYCHOSOCIAL ASSESSMENT 06/06/2014  Patient:  Justin Orozco, Justin Orozco     Account Number:  [id number]     Admit date:  05/31/2014  Clinical Social Worker:  Lovey Newcomer  Date/Time:  06/06/2014 04:05 PM  Referred by:  Physician  Date Referred:  06/06/2014 Referred for  SNF Placement   Other Referral:   NA   Interview type:  Patient Other interview type:   Patient and family interviewed at bedside.    PSYCHOSOCIAL DATA Living Status:  ALONE Admitted from facility:   Level of care:   Primary support name:  Justin Orozco and Justin Orozco Primary support relationship to patient:  CHILD, ADULT Degree of support available:   Support is strong.    CURRENT CONCERNS Current Concerns  Post-Acute Placement   Other Concerns:   NA    SOCIAL WORK ASSESSMENT / PLAN CSW met with patient and children at bedside to complete assessment. Patient and family believe patient should DC to CIR and do not seem optimistic about patient needing SNF placement. CSW explained to patient and family that CIR has evaluated patient and has determined that the patient would be best served in another setting (home vs SNF). CSW explained to family and patient that the patient is ready for discharge to SNF as we speak and that patient and family will need to make a decision quickly about disposition. CSW explained SNF search/placement process and answered patient/family's questions. Patient really doesn't seem interested in the idea of going to SNF but seems to understand that this is a step he may need before returning home. Family appears calm and supportive of patient.   Assessment/plan status:  Psychosocial Support/Ongoing Assessment of Needs Other assessment/ plan:   Complete Fl2, Fax, PASRR   Information/referral to community resources:   CSW contact information and SNF list given.    PATIENT'S/FAMILY'S RESPONSE TO PLAN OF CARE: Patient and family unsure of where patient  will DC to at this time. Family still seems to think the patient will be able to get into CIR despite CSW informing them that CIR does not appear to be an option for the patient. CSW will follow up with available bed offers.       Liz Beach MSW, [place], [place], [phone]

## 2014-06-06 NOTE — Clinical Social Work Placement (Addendum)
Clinical Social Work Department CLINICAL SOCIAL WORK PLACEMENT NOTE 06/06/2014  Patient:  Justin Orozco, Justin Orozco  Account Number:  [id number] Admit date:  05/31/2014  Clinical Social Worker:  Kemper Durie, [place]  Date/time:  06/06/2014 04:13 PM  Clinical Social Work is seeking post-discharge placement for this patient at the following level of care:   [place]   (*CSW will update this form in Epic as items are completed)   06/06/2014  Patient/family provided with [place] Department of Clinical Social Work's list of facilities offering this level of care within the geographic area requested by the patient (or if unable, by the patient's family).  06/06/2014  Patient/family informed of their freedom to choose among providers that offer the needed level of care, that participate in Medicare, Medicaid or managed care program needed by the patient, have an available bed and are willing to accept the patient.  06/06/2014  Patient/family informed of [facility]' ownership interest in [facility], as well as of the fact that they are under no obligation to receive care at this facility.  PASARR submitted to EDS on 06/06/2014 PASARR number received on 06/06/2014  FL2 transmitted to all facilities in geographic area requested by pt/family on  06/06/2014 FL2 transmitted to all facilities within larger geographic area on   Patient informed that his/her managed care company has contracts with or will negotiate with  certain facilities, including the following:     Patient/family informed of bed offers received: 06/07/14 Evette Cristal, MSW, [place], 06/07/14) Patient chooses bed at [facility] (Evette Cristal, MSW, [place], 06/07/14) Physician recommends and patient chooses bed at    Patient to be transferred to [facility] on 06/07/14 Evette Cristal, MSW, LCSWA, 06/07/14)  Patient to be transferred to facility by family Evette Cristal, MSW, [place], 06/07/14) Patient  and family notified of transfer on 06/07/14 Evette Cristal, MSW, [place], 06/07/14) Name of family member notified: Marcy Siren son Evette Cristal, MSW, [place], 06/07/14)    The following physician request were entered in Epic:   Additional Comments:    Liz Beach MSW, [place], [place], [phone]  Jones Broom. [place], MSW, [place] 06/07/2014 4:38 PM

## 2014-06-07 ENCOUNTER — Other Ambulatory Visit: Payer: Self-pay | Admitting: *Deleted

## 2014-06-07 DIAGNOSIS — E46 Unspecified protein-calorie malnutrition: Secondary | ICD-10-CM | POA: Diagnosis not present

## 2014-06-07 DIAGNOSIS — R278 Other lack of coordination: Secondary | ICD-10-CM | POA: Diagnosis not present

## 2014-06-07 DIAGNOSIS — E876 Hypokalemia: Secondary | ICD-10-CM | POA: Diagnosis not present

## 2014-06-07 DIAGNOSIS — Z8546 Personal history of malignant neoplasm of prostate: Secondary | ICD-10-CM | POA: Diagnosis not present

## 2014-06-07 DIAGNOSIS — S72002S Fracture of unspecified part of neck of left femur, sequela: Secondary | ICD-10-CM | POA: Diagnosis not present

## 2014-06-07 DIAGNOSIS — I442 Atrioventricular block, complete: Secondary | ICD-10-CM | POA: Diagnosis not present

## 2014-06-07 DIAGNOSIS — I4892 Unspecified atrial flutter: Secondary | ICD-10-CM | POA: Diagnosis not present

## 2014-06-07 DIAGNOSIS — Z9889 Other specified postprocedural states: Secondary | ICD-10-CM

## 2014-06-07 DIAGNOSIS — I5042 Chronic combined systolic (congestive) and diastolic (congestive) heart failure: Secondary | ICD-10-CM | POA: Diagnosis not present

## 2014-06-07 DIAGNOSIS — Z471 Aftercare following joint replacement surgery: Secondary | ICD-10-CM | POA: Diagnosis not present

## 2014-06-07 DIAGNOSIS — D62 Acute posthemorrhagic anemia: Secondary | ICD-10-CM | POA: Diagnosis not present

## 2014-06-07 DIAGNOSIS — I110 Hypertensive heart disease with heart failure: Secondary | ICD-10-CM | POA: Diagnosis not present

## 2014-06-07 DIAGNOSIS — M6281 Muscle weakness (generalized): Secondary | ICD-10-CM | POA: Diagnosis not present

## 2014-06-07 DIAGNOSIS — K5900 Constipation, unspecified: Secondary | ICD-10-CM | POA: Diagnosis not present

## 2014-06-07 DIAGNOSIS — R21 Rash and other nonspecific skin eruption: Secondary | ICD-10-CM | POA: Diagnosis not present

## 2014-06-07 DIAGNOSIS — I10 Essential (primary) hypertension: Secondary | ICD-10-CM | POA: Diagnosis not present

## 2014-06-07 DIAGNOSIS — I4891 Unspecified atrial fibrillation: Secondary | ICD-10-CM | POA: Diagnosis not present

## 2014-06-07 DIAGNOSIS — M7989 Other specified soft tissue disorders: Secondary | ICD-10-CM

## 2014-06-07 DIAGNOSIS — Z950 Presence of cardiac pacemaker: Secondary | ICD-10-CM | POA: Diagnosis not present

## 2014-06-07 DIAGNOSIS — Z96642 Presence of left artificial hip joint: Secondary | ICD-10-CM | POA: Diagnosis not present

## 2014-06-07 DIAGNOSIS — R531 Weakness: Secondary | ICD-10-CM | POA: Diagnosis not present

## 2014-06-07 DIAGNOSIS — R600 Localized edema: Secondary | ICD-10-CM | POA: Diagnosis not present

## 2014-06-07 DIAGNOSIS — R2681 Unsteadiness on feet: Secondary | ICD-10-CM | POA: Diagnosis not present

## 2014-06-07 LAB — VITAMIN D 25 HYDROXY (VIT D DEFICIENCY, FRACTURES): Vit D, 25-Hydroxy: 29 ng/mL — ABNORMAL LOW (ref 30–100)

## 2014-06-07 MED ORDER — ASPIRIN 325 MG PO TBEC: 325.0000 mg | DELAYED_RELEASE_TABLET | Freq: Every day | ORAL | Status: AC

## 2014-06-07 MED ORDER — BISACODYL 10 MG RE SUPP: 10.0000 mg | Freq: Every day | RECTAL | Status: DC | PRN

## 2014-06-07 MED ORDER — POLYETHYLENE GLYCOL 3350 17 G PO PACK: 17.0000 g | PACK | Freq: Every day | ORAL | Status: DC | PRN

## 2014-06-07 MED ORDER — SENNOSIDES-DOCUSATE SODIUM 8.6-50 MG PO TABS: 1.0000 | ORAL_TABLET | Freq: Every day | ORAL | Status: DC

## 2014-06-07 MED ORDER — FUROSEMIDE 10 MG/ML IJ SOLN
20.0000 mg | Freq: Once | INTRAMUSCULAR | Status: DC
  Filled 2014-06-07: qty 2

## 2014-06-07 MED ORDER — ASPIRIN EC 81 MG PO TBEC: 81.0000 mg | DELAYED_RELEASE_TABLET | Freq: Every day | ORAL | Status: DC

## 2014-06-07 MED ORDER — METOPROLOL TARTRATE 25 MG PO TABS: 25.0000 mg | ORAL_TABLET | Freq: Two times a day (BID) | ORAL | Status: DC

## 2014-06-07 MED ORDER — HYDROCODONE-ACETAMINOPHEN 5-325 MG PO TABS: 1.0000 | ORAL_TABLET | ORAL | Status: DC | PRN

## 2014-06-07 MED ORDER — ENSURE COMPLETE PO LIQD: 237.0000 mL | Freq: Two times a day (BID) | ORAL | Status: DC

## 2014-06-07 MED ORDER — ASPIRIN 325 MG PO TBEC: 325.0000 mg | DELAYED_RELEASE_TABLET | Freq: Every day | ORAL | Status: DC

## 2014-06-07 MED ORDER — ASPIRIN EC 325 MG PO TBEC: 325.0000 mg | DELAYED_RELEASE_TABLET | Freq: Every day | ORAL | Status: DC

## 2014-06-07 MED ORDER — DSS 100 MG PO CAPS: 100.0000 mg | ORAL_CAPSULE | Freq: Two times a day (BID) | ORAL | Status: DC

## 2014-06-07 NOTE — Progress Notes (Signed)
VASCULAR LAB PRELIMINARY  PRELIMINARY  PRELIMINARY  PRELIMINARY  Bilateral lower extremity venous Dopplers completed.    Preliminary report:  There is no DVT or SVT noted in the bilateral lower extremities.   Bradyn Vassey, RVT 06/07/2014, 9:17 AM

## 2014-06-07 NOTE — Clinical Social Work Note (Signed)
Patient to be d/c'ed today to [facility].  Patient and family agreeable to plans will transport by son's car.  RN to call report.  Evette Cristal, MSW, [place]

## 2014-06-07 NOTE — Progress Notes (Signed)
Physical Therapy Treatment Patient Details Name: Justin Orozco MRN: [id number] DOB: [birth year] Today's Date: 06/07/2014    History of Present Illness 80 y.o. male s/p LEFT HIP HEMI ARTHROPLASTY  following hip fracture.   history of complete heart block status post pacemaker placement in March 2015, paroxysmal atrial flutter, and heart failure.    PT Comments    Pt progressing towards physical therapy goals. Slowly improving ambulatory distance but remains limited due to Lt hip pain. Does not recall posterior hip precautions despite frequent reinforcement, handouts, and education. D/c recommendations updated due to pt not being a candidate for CIR. Patient will continue to benefit from skilled physical therapy services to further improve independence with functional mobility.   Follow Up Recommendations  SNF     Equipment Recommendations  3in1 (PT)    Recommendations for Other Services OT consult     Precautions / Restrictions Precautions Precautions: Posterior Hip;Fall Precaution Comments: Pt unable to recall any of posterior hip precautions.  Reviewed posterior hip precautions. . Restrictions Weight Bearing Restrictions: Yes LLE Weight Bearing: Weight bearing as tolerated    Mobility  Bed Mobility                  Transfers Overall transfer level: Needs assistance Equipment used: Rolling walker (2 wheeled) Transfers: Sit to/from Stand Sit to Stand: Min assist         General transfer comment: Min assist for stability upon standing from reclining chair. VC for technique. Forgets to scoot forward. Improved control with descent into chair.  Ambulation/Gait Ambulation/Gait assistance: Min guard Ambulation Distance (Feet): 90 Feet Assistive device: Rolling walker (2 wheeled) Gait Pattern/deviations: Step-to pattern;Step-through pattern;Decreased step length - right;Decreased stance time - left;Antalgic;Trunk flexed Gait velocity: decreased   General Gait  Details: Frequent cues for upright posture. Slowly emerging step-through gait pattern. Cues for Lt heel strike and increased Rt step lenght. No loss of balance but distance limited by pain, pt did not wish to walker further.   Stairs            Wheelchair Mobility    Modified Rankin (Stroke Patients Only)       Balance                                    Cognition Arousal/Alertness: Awake/alert Behavior During Therapy: WFL for tasks assessed/performed Overall Cognitive Status: Impaired/Different from baseline Area of Impairment: Memory     Memory: Decreased recall of precautions;Decreased short-term memory         General Comments: Does not remember hip precautions.     Exercises      General Comments        Pertinent Vitals/Pain Pain Assessment: 0-10 Pain Score:  ("Gets worse as I walk" initially 0/10) Pain Location: Lt hip Pain Descriptors / Indicators: Sharp Pain Intervention(s): Monitored during session;Repositioned;Limited activity within patient's tolerance    Home Living                      Prior Function            PT Goals (current goals can now be found in the care plan section) Acute Rehab PT Goals Patient Stated Goal: Go home PT Goal Formulation: With patient Time For Goal Achievement: 06/16/14 Potential to Achieve Goals: Good Progress towards PT goals: Progressing toward goals    Frequency  Min 5X/week    PT Plan  Discharge plan needs to be updated    Co-evaluation             End of Session   Activity Tolerance: Patient limited by pain Patient left: in chair;with call bell/phone within reach;with family/visitor present     Time: 1119-1130 PT Time Calculation (min) (ACUTE ONLY): 11 min  Charges:  $Gait Training: 8-22 mins                    G Codes:      [name] June 27, 2014, 11:51 AM Elayne Snare, [place]

## 2014-06-07 NOTE — Progress Notes (Signed)
PT Cancellation Note  Patient Details Name: Justin Orozco MRN: [id number] DOB: May 18, 1927   Cancelled Treatment:    Reason Eval/Treat Not Completed: Patient at procedure or test/unavailable Attempt to see pt early for d/c. Pt has been taken to vascular lab. Will follow up as time allows.  [name], [name]   [name] 06/07/2014, 9:08 AM

## 2014-06-07 NOTE — Progress Notes (Signed)
Assessment unchanged. Discussed D/C instructions with pt and family including f/u appointments and new medications. Verbalized understanding. SNF Packet given to family and informed to take packet to SNF. Verbalized understanding. IV and tele removed. Pt left with belongings accompanied by RN and NT.

## 2014-06-07 NOTE — Discharge Summary (Signed)
Justin Orozco, 80 y.o., DOB 1926-08-29, MRN [id number]. Admission date: 05/31/2014 Discharge Date 06/07/2014 Primary MD Geoffery Lyons, MD Admitting Physician Jonetta Osgood, MD  Admission Diagnosis  LEFT HIP FRACTURE   Discharge Diagnosis   Principal Problem:   Closed left hip fracture Active Problems:   CHF (congestive heart failure)   Lower extremity edema   Paroxysmal atrial flutter   Pacemaker   Hip fracture   Chronic combined systolic and diastolic congestive heart failure     PCP please follow: Check CBC, BMP during next visit.  Past Medical History  Diagnosis Date  . Atrial flutter   . BPH (benign prostatic hyperplasia)   . Complete heart block 08/2013    s/p STJ dual chamber pacemaker implant  . Paroxysmal atrial flutter   . Lower extremity edema   . CHF (congestive heart failure)   . Presence of permanent cardiac pacemaker   . Arthritis     "nothing serious" (05/31/2014)  . Prostate cancer     radiation    Past Surgical History  Procedure Laterality Date  . Total hip arthroplasty Right 08/2010    Dr. French Ana  . Transthoracic echocardiogram  08/2010    EF 55-60%, mod conc hypertrophy, grade 1 diastolic dysfunction, AV sclerosis, mild regurg; RA mildly dilated  . Nm myocar perf wall motion  07/2010    bruce myoview - perfusion defect in basal-apical inferior region (more at rest than stress) - non-gated (A-Flutter), low risk  . Permanent pacemaker insertion N/A 08/21/2013    Procedure: PERMANENT PACEMAKER INSERTION;  Surgeon: Coralyn Mark, MD;  Location: [facility];  Service: Cardiovascular;  Laterality: N/A;  . Insert / replace / remove pacemaker    . Tonsillectomy  ~ 1935  . Anterior cruciate ligament repair Right ~ 1950  . Hip arthroplasty Left 06/01/2014    Procedure: LEFT HIP HEMI ARTHROPLASTY ;  Surgeon: Renette Butters, MD;  Location: [facility];  Service: Orthopedics;  Laterality: Left;  Requesting Larkin Ina or April RNFA   Admission history of  present illness/brief narrative: With a history of complete heart block status post pacemaker placement in March 2015, paroxysmal atrial flutter, heart failure, but presented as a direct admission from [name]'s office. Patient was seen for left hip pain for approximately 2 days. Patient denied any trauma, injury, fall. Patient does have a history of right hip fracture with hemiarthroplasty in 2012. Patient does use a walker to aid in ambulation. Patient does state he had pacemaker placed in March 2015, and follows up with Dr. [name]. Patient denies any chest pain, shortness of breath, recent illness or travel upon admission. Patient underwent left hip hemiarthroplasty by [name] on 12/22 which he tolerated very well.  Hospital Course See H&P, Labs, Consult and Test reports for all details in brief, patient was admitted for   Principal Problem:   Closed left hip fracture Active Problems:   CHF (congestive heart failure)   Lower extremity edema   Paroxysmal atrial flutter   Pacemaker   Hip fracture   Chronic combined systolic and diastolic congestive heart failure  Left hip fracture -Patient directly admitted to telemetry from Dr. [name]'s office -Left hip hemiarthroplasty 06/01/2014 -PT and OT . - SCDs and 325 mg aspirin for DVT prophylaxis.   Chronic combined systolic and diastolic heart failure -Echo in March 2015 shows EF of 81-27%, grade 1 diastolic dysfunction -Currently appears compensated -Continue home medications of metoprolol,  -Mild leg edema at day  of discharge, will give one dose Lasix, and resume hydrochlorothiazide on discharge - Dopplers negative for DVT  History of paroxysmal atrial flutter -Rate controlled -Continue metoprolol  History of complete heart block -Patient has dual-chamber pacemaker, implanted 08/2013   History of prostate cancer -Patient did receive radiation  Chronic lower extremity edema -Per patient, appears to be  at baseline  Consults   orthopedic  Significant Tests:  See full reports for all details    Pelvis Portable  06/01/2014   CLINICAL DATA:  Status post left hip replacement  EXAM: PORTABLE PELVIS 1-2 VIEWS  COMPARISON:  09/06/2010  FINDINGS: A left hip replacement is noted. Old right hip replacement is again seen. No acute abnormality is seen. No soft tissue changes are noted.  IMPRESSION: Status post bilateral hip replacement. No acute abnormality is noted.   Electronically Signed   By: Inez Catalina M.D.   On: 06/01/2014 12:52   Chest Portable 1 View  05/31/2014   CLINICAL DATA:  Preoperative exam, history of congestive heart failure  EXAM: PORTABLE CHEST - 1 VIEW  COMPARISON:  08/22/2013  FINDINGS: Left-sided pacer in place. Mild enlargement of the cardiomediastinal silhouette is identified. Patchy retrocardiac opacity is identified with trace left pleural effusion. No acute osseous abnormality.  IMPRESSION: Patchy left lower lobe airspace consolidation which could represent atelectasis although early pneumonia or edema could appear similar.   Electronically Signed   By: Conchita Paris M.D.   On: 05/31/2014 23:42     Today   Subjective:   [name] today has no headache,no chest abdominal pain,no new weakness tingling or numbness, feels much better.  Objective:   Blood pressure 111/58, pulse 80, temperature 98.2 F (36.8 C), temperature source Oral, resp. rate 18, height 5' 9.5" (1.765 m), weight 59.194 kg (130 lb 8 oz), SpO2 98 %.  Intake/Output Summary (Last 24 hours) at 06/07/14 1052 Last data filed at 06/07/14 0700  Gross per 24 hour  Intake    560 ml  Output    725 ml  Net   -165 ml    Exam  Awake Alert, Oriented X 3, No new F.N deficits, Normal affect [place].AT,PERRAL Supple Neck,No JVD, No cervical lymphadenopathy appriciated.  Symmetrical Chest wall movement, Good air movement bilaterally, CTAB RRR,No Gallops,Rubs or new Murmurs, No Parasternal Heave +ve B.Sounds,  Abd Soft, No tenderness, No organomegaly appriciated, No rebound - guarding or rigidity. No Cyanosis, Clubbing ,  mild edema, No new Rash or bruise , good pulses bilaterally  Data Review     CBC w Diff:  Lab Results  Component Value Date   WBC 7.6 06/05/2014   HGB 10.0* 06/05/2014   HCT 30.1* 06/05/2014   PLT 234 06/05/2014   LYMPHOPCT 17 05/31/2014   MONOPCT 10 05/31/2014   EOSPCT 2 05/31/2014   BASOPCT 0 05/31/2014   CMP:  Lab Results  Component Value Date   NA 138 06/05/2014   K 4.2 06/05/2014   CL 103 06/05/2014   CO2 29 06/05/2014   BUN 40* 06/05/2014   CREATININE 1.27 06/05/2014   CREATININE 1.17 09/07/2013   PROT 5.5* 05/31/2014   ALBUMIN 2.9* 05/31/2014   BILITOT 0.4 05/31/2014   ALKPHOS 70 05/31/2014   AST 23 05/31/2014   ALT 13 05/31/2014  .  Micro Results Recent Results (from the past 240 hour(s))  Surgical pcr screen     Status: None   Collection Time: 05/31/14  9:36 PM  Result Value Ref Range Status   MRSA,  PCR NEGATIVE NEGATIVE Final   Staphylococcus aureus NEGATIVE NEGATIVE Final    Comment:        The Xpert SA Assay (FDA approved for NASAL specimens in patients over 26 years of age), is one component of a comprehensive surveillance program.  Test performance has been validated by [employer] for patients greater than or equal to 21 year old. It is not intended to diagnose infection nor to guide or monitor treatment.      Discharge Instructions          Follow-up Information    Follow up with MURPHY, TIMOTHY, D, MD In 1 week.   Specialty:  Orthopedic Surgery   Contact information:   [address] [address] [phone]       Follow up with [facility].   Why:  HH- RN/PT/OT/aide arranged   Contact information:   [address] [place] [address] [phone]       Follow up with [name],[name], MD In 1 week.   Specialty:  Internal Medicine   Contact information:    [address] [place] [address] [phone]       Follow up with [name], D, MD. Schedule an appointment as soon as possible for a visit in 1 week.   Specialty:  Orthopedic Surgery   Contact information:   [address] [place] [address] [phone]       Discharge Medications     Medication List    STOP taking these medications        hydrALAZINE 10 MG tablet  Commonly known as:  APRESOLINE      TAKE these medications        aspirin 325 MG EC tablet  Take 1 tablet (325 mg total) by mouth daily with breakfast. Please take till 06/21/2014, then transition back to low dose baby aspirin.     aspirin EC 81 MG tablet  Take 1 tablet (81 mg total) by mouth daily.  Start taking on:  06/22/2014     bisacodyl 10 MG suppository  Commonly known as:  DULCOLAX  Place 1 suppository (10 mg total) rectally daily as needed for moderate constipation.     docusate sodium 100 MG capsule  Commonly known as:  COLACE  Take 1 capsule (100 mg total) by mouth 2 (two) times daily. Continue this while taking narcotics to help with bowel movements     feeding supplement (ENSURE COMPLETE) Liqd  Take 237 mLs by mouth 2 (two) times daily between meals.     hydrochlorothiazide 12.5 MG capsule  Commonly known as:  MICROZIDE  Take 1 capsule (12.5 mg total) by mouth daily.     HYDROcodone-acetaminophen 5-325 MG per tablet  Commonly known as:  NORCO  Take 1 tablet by mouth every 4 (four) hours as needed for moderate pain.     metoprolol tartrate 25 MG tablet  Commonly known as:  LOPRESSOR  Take 1 tablet (25 mg total) by mouth 2 (two) times daily.     polyethylene glycol packet  Commonly known as:  MIRALAX / GLYCOLAX  Take 17 g by mouth daily as needed for moderate constipation.     potassium chloride 10 MEQ tablet  Commonly known as:  K-DUR  Take 1 tablet (10 mEq total) by mouth daily.     senna-docusate 8.6-50 MG per tablet  Commonly known as:   Senokot-S  Take 1 tablet by mouth at bedtime.  Total Time in preparing paper work, data evaluation and todays exam - 35 minutes  Rhayne Chatwin M.D on 06/07/2014 at 10:52 AM  [place]  [phone]

## 2014-06-07 NOTE — Discharge Instructions (Signed)
Bear weight as tolerated  Posterior hip precautions  Follow with Primary MD [name],[name], MD in 7 days   Get CBC, CMP, 2 view Chest X ray checked  by Primary MD next visit.    Activity: As per orthopedic recommendations, posterior hip precaution, daily weight as tolerated   Disposition skilled nursing facility   Diet: Heart Healthy  , with feeding assistance and aspiration precautions as needed.  For Heart failure patients - Check your Weight same time everyday, if you gain over 2 pounds, or you develop in leg swelling, experience more shortness of breath or chest pain, call your Primary MD immediately. Follow Cardiac Low Salt Diet and 1.8 lit/day fluid restriction.   On your next visit with your primary care physician please Get Medicines reviewed and adjusted.   Please request your Prim.MD to go over all Hospital Tests and Procedure/Radiological results at the follow up, please get all Hospital records sent to your Prim MD by signing hospital release before you go home.   If you experience worsening of your admission symptoms, develop shortness of breath, life threatening emergency, suicidal or homicidal thoughts you must seek medical attention immediately by calling 911 or calling your MD immediately  if symptoms less severe.  You Must read complete instructions/literature along with all the possible adverse reactions/side effects for all the Medicines you take and that have been prescribed to you. Take any new Medicines after you have completely understood and accpet all the possible adverse reactions/side effects.   Do not drive, operating heavy machinery, perform activities at heights, swimming or participation in water activities or provide baby sitting services if your were admitted for syncope or siezures until you have seen by Primary MD or a Neurologist and advised to do so again.  Do not drive when taking Pain medications.    Do not take more than prescribed Pain,  Sleep and Anxiety Medications  Special Instructions: If you have smoked or chewed Tobacco  in the last 2 yrs please stop smoking, stop any regular Alcohol  and or any Recreational drug use.  Wear Seat belts while driving.   Please note  You were cared for by a hospitalist during your hospital stay. If you have any questions about your discharge medications or the care you received while you were in the hospital after you are discharged, you can call the unit and asked to speak with the hospitalist on call if the hospitalist that took care of you is not available. Once you are discharged, your primary care physician will handle any further medical issues. Please note that NO REFILLS for any discharge medications will be authorized once you are discharged, as it is imperative that you return to your primary care physician (or establish a relationship with a primary care physician if you do not have one) for your aftercare needs so that they can reassess your need for medications and monitor your lab values.

## 2014-06-08 ENCOUNTER — Encounter: Payer: Self-pay | Admitting: Adult Health

## 2014-06-08 ENCOUNTER — Other Ambulatory Visit: Payer: Self-pay | Admitting: *Deleted

## 2014-06-08 ENCOUNTER — Non-Acute Institutional Stay (SKILLED_NURSING_FACILITY): Payer: Medicare Other | Admitting: Adult Health

## 2014-06-08 DIAGNOSIS — I5042 Chronic combined systolic (congestive) and diastolic (congestive) heart failure: Secondary | ICD-10-CM

## 2014-06-08 DIAGNOSIS — S72002S Fracture of unspecified part of neck of left femur, sequela: Secondary | ICD-10-CM | POA: Diagnosis not present

## 2014-06-08 DIAGNOSIS — R600 Localized edema: Secondary | ICD-10-CM | POA: Diagnosis not present

## 2014-06-08 DIAGNOSIS — I4892 Unspecified atrial flutter: Secondary | ICD-10-CM | POA: Diagnosis not present

## 2014-06-08 DIAGNOSIS — D62 Acute posthemorrhagic anemia: Secondary | ICD-10-CM | POA: Diagnosis not present

## 2014-06-08 MED ORDER — HYDROCODONE-ACETAMINOPHEN 5-325 MG PO TABS: 1.0000 | ORAL_TABLET | ORAL | Status: DC | PRN

## 2014-06-08 NOTE — Telephone Encounter (Signed)
[facility] 

## 2014-06-08 NOTE — Progress Notes (Signed)
Patient ID: Justin Orozco, male   DOB: 1927-02-23, 80 y.o.   MRN: [id number]   06/08/2014  Facility:  Nursing Home Location:  [facility] Room Number: 703-P LEVEL OF CARE:  SNF (31)   Chief Complaint  Patient presents with  . Hospitalization Follow-up    Left hip fracture S/P hemiarthroplasty, CHF, paroxysmal atrial flutter and anemia    HISTORY OF PRESENT ILLNESS:  This is an 80 year old male who has been admitted to [facility] on 06/07/14 from [facility]. He presented to the ED with complaints of left hip pain 2 days with no history of fall/trauma. He has past medical history of complete heart lap status post pacemaker placement, paroxysmal atrial flutter and heart failure. He was noted to have left hip fracture and now S/P left hip hemiarthroplasty. He has been admitted for a short-term rehabilitation.   PAST MEDICAL HISTORY:  Past Medical History  Diagnosis Date  . Atrial flutter   . BPH (benign prostatic hyperplasia)   . Complete heart block 08/2013    s/p STJ dual chamber pacemaker implant  . Paroxysmal atrial flutter   . Lower extremity edema   . CHF (congestive heart failure)   . Presence of permanent cardiac pacemaker   . Arthritis     "nothing serious" (05/31/2014)  . Prostate cancer     radiation    CURRENT MEDICATIONS: Reviewed per MAR/see medication list  No Known Allergies   REVIEW OF SYSTEMS:  GENERAL: no change in appetite, no fatigue, no weight changes, no fever, chills or weakness RESPIRATORY: no cough, SOB, DOE, wheezing, hemoptysis CARDIAC: no chest pain, or palpitations GI: no abdominal pain, diarrhea, constipation, heart burn, nausea or vomiting  PHYSICAL EXAMINATION  GENERAL: no acute distress, normal body habitus EYES: conjunctivae normal, sclerae normal, normal eye lids NECK: supple, trachea midline, no neck masses, no thyroid tenderness, no thyromegaly LYMPHATICS: no LAN in the neck, no  supraclavicular LAN RESPIRATORY: breathing is even & unlabored, BS CTAB CARDIAC: RRR, no murmur,no extra heart sounds, BLE edema 1+; left chest pacemaker GI: abdomen soft, normal BS, no masses, no tenderness, no hepatomegaly, no splenomegaly EXTREMITIES: Able to move 4 extremities PSYCHIATRIC: the patient is alert & oriented to person, affect & behavior appropriate  LABS/RADIOLOGY: Labs reviewed: Basic Metabolic Panel:  Recent Labs  08/20/13 2058  06/02/14 0241 06/03/14 0446 06/05/14 0300  NA 145  < > 138 133* 138  K 4.2  < > 3.7 3.9 4.2  CL 109  < > 104 102 103  CO2 23  < > 29 26 29   GLUCOSE 147*  < > 119* 103* 107*  BUN 45*  < > 28* 38* 40*  CREATININE 1.36*  < > 1.28 1.37* 1.27  CALCIUM 9.1  < > 8.4 8.5 8.6  MG 2.2  --   --   --   --   < > = values in this interval not displayed. Liver Function Tests:  Recent Labs  08/20/13 2058 05/31/14 1620 05/31/14 2030  AST 36 30 23  ALT 40 17 13  ALKPHOS 86 83 70  BILITOT 0.7 0.8 0.4  PROT 5.3* 6.7 5.5*  ALBUMIN 3.1* 3.7 2.9*   CBC:  Recent Labs  08/20/13 2058 05/31/14 1620 06/02/14 0241 06/03/14 0446 06/05/14 0300  WBC 5.1 6.6 6.3 10.2 7.6  NEUTROABS 3.4 4.7  --   --   --   HGB 11.6* 13.3 11.3* 11.2* 10.0*  HCT 34.1* 40.4 34.5* 34.0*  30.1*  MCV 91.2 91.0 91.8 91.4 90.9  PLT 151 224 207 183 234   Pelvis Portable  06/01/2014   CLINICAL DATA:  Status post left hip replacement  EXAM: PORTABLE PELVIS 1-2 VIEWS  COMPARISON:  09/06/2010  FINDINGS: A left hip replacement is noted. Old right hip replacement is again seen. No acute abnormality is seen. No soft tissue changes are noted.  IMPRESSION: Status post bilateral hip replacement. No acute abnormality is noted.   Electronically Signed   By: Inez Catalina M.D.   On: 06/01/2014 12:52   Chest Portable 1 View  05/31/2014   CLINICAL DATA:  Preoperative exam, history of congestive heart failure  EXAM: PORTABLE CHEST - 1 VIEW  COMPARISON:  08/22/2013  FINDINGS: Left-sided  pacer in place. Mild enlargement of the cardiomediastinal silhouette is identified. Patchy retrocardiac opacity is identified with trace left pleural effusion. No acute osseous abnormality.  IMPRESSION: Patchy left lower lobe airspace consolidation which could represent atelectasis although early pneumonia or edema could appear similar.   Electronically Signed   By: Conchita Paris M.D.   On: 05/31/2014 23:42    ASSESSMENT/PLAN:  Left hip fracture S/P hemiarthroplasty - for rehabilitation; continue aspirin 325 mg by mouth daily by mouth 06/21/14 then aspirin 81 mg by mouth daily to start on 06/22/14 for DVT prophylaxis; continue Norco 5/325 mg 1 tab by mouth every 4 hours when necessary for pain Chronic combined systolic and diastolic heart failure - stable; continue metoprolol 25 mg by mouth twice a day  Bilateral lower extremity edema - continue HCTZ 12.5 mg by mouth daily Paroxysmal atrial flutter - rate controlled; continue metoprolol Anemia, acute blood loss - hemoglobin 10.0; stable  Goals of care:  Short-term rehabilitation   Labs/test ordered:  none   Spent 50 minutes in patient care.    [facility], NP [employer] [phone]

## 2014-06-09 ENCOUNTER — Non-Acute Institutional Stay (SKILLED_NURSING_FACILITY): Payer: Medicare Other | Admitting: Internal Medicine

## 2014-06-09 ENCOUNTER — Encounter: Payer: Self-pay | Admitting: Internal Medicine

## 2014-06-09 DIAGNOSIS — K5900 Constipation, unspecified: Secondary | ICD-10-CM | POA: Diagnosis not present

## 2014-06-09 DIAGNOSIS — S72002S Fracture of unspecified part of neck of left femur, sequela: Secondary | ICD-10-CM | POA: Diagnosis not present

## 2014-06-09 DIAGNOSIS — I4892 Unspecified atrial flutter: Secondary | ICD-10-CM | POA: Diagnosis not present

## 2014-06-09 DIAGNOSIS — R21 Rash and other nonspecific skin eruption: Secondary | ICD-10-CM

## 2014-06-09 DIAGNOSIS — I5042 Chronic combined systolic (congestive) and diastolic (congestive) heart failure: Secondary | ICD-10-CM | POA: Diagnosis not present

## 2014-06-09 DIAGNOSIS — I442 Atrioventricular block, complete: Secondary | ICD-10-CM | POA: Diagnosis not present

## 2014-06-09 DIAGNOSIS — E876 Hypokalemia: Secondary | ICD-10-CM | POA: Diagnosis not present

## 2014-06-09 DIAGNOSIS — E46 Unspecified protein-calorie malnutrition: Secondary | ICD-10-CM

## 2014-06-09 DIAGNOSIS — D62 Acute posthemorrhagic anemia: Secondary | ICD-10-CM

## 2014-06-09 NOTE — Progress Notes (Signed)
Patient ID: Justin Orozco, male   DOB: 08/24/1926, 80 y.o.   MRN: [id number]     [facility]   PCP: [name],[name], MD  Code Status: full code  No Known Allergies  Chief Complaint  Patient presents with  . New Admit To SNF     HPI:  80 y/o male patient is here for STR post hospital admission from 05/31/14-06/07/14 with left hip pain. He was noted to have left hip fracture and underwent left hip hemiarthroplasty by [name] on 12/22. He has PMH of complete heart block status post pacemaker placement in March 2015, paroxysmal atrial flutter, CHF.  He is seen in his room today with his son present. He has developed rash in his groin area noticed by staff this am. His pain is under control. He has regular bowel movement. Denies any other concerns.   Review of Systems:  Constitutional: Negative for fever, chills, malaise/fatigue and diaphoresis.  HENT: Negative for congestion Respiratory: Negative for cough, shortness of breath and wheezing.   Cardiovascular: Negative for chest pain, palpitations, leg swelling.  Gastrointestinal: Negative for heartburn, nausea, vomiting, abdominal pain Genitourinary: Negative for dysuria Musculoskeletal: Negative for back pain, falls Skin: Negative for itching, sores Neurological: Negative for dizziness, tingling, focal weakness and headaches.  Psychiatric/Behavioral: Negative for depression, insomnia and memory loss. The patient is not nervous/anxious.     Past Medical History  Diagnosis Date  . Atrial flutter   . BPH (benign prostatic hyperplasia)   . Complete heart block 08/2013    s/p STJ dual chamber pacemaker implant  . Paroxysmal atrial flutter   . Lower extremity edema   . CHF (congestive heart failure)   . Presence of permanent cardiac pacemaker   . Arthritis     "nothing serious" (05/31/2014)  . Prostate cancer     radiation   Past Surgical History  Procedure Laterality Date  .  Total hip arthroplasty Right 08/2010    Dr. French Ana  . Transthoracic echocardiogram  08/2010    EF 55-60%, mod conc hypertrophy, grade 1 diastolic dysfunction, AV sclerosis, mild regurg; RA mildly dilated  . Nm myocar perf wall motion  07/2010    bruce myoview - perfusion defect in basal-apical inferior region (more at rest than stress) - non-gated (A-Flutter), low risk  . Permanent pacemaker insertion N/A 08/21/2013    Procedure: PERMANENT PACEMAKER INSERTION;  Surgeon: Coralyn Mark, MD;  Location: [facility];  Service: Cardiovascular;  Laterality: N/A;  . Insert / replace / remove pacemaker    . Tonsillectomy  ~ 1935  . Anterior cruciate ligament repair Right ~ 1950  . Hip arthroplasty Left 06/01/2014    Procedure: LEFT HIP HEMI ARTHROPLASTY ;  Surgeon: Renette Butters, MD;  Location: [place];  Service: Orthopedics;  Laterality: Left;  Requesting Larkin Ina or April RNFA   Social History:   reports that he has been passively smoking.  He has never used smokeless tobacco. He reports that he drinks about 2.3 oz of alcohol per week. He reports that he does not use illicit drugs.  History reviewed. No pertinent family history.  Medications: Patient's Medications  New Prescriptions   No medications on file  Previous Medications   ASPIRIN 325 MG EC TABLET    Take 1 tablet (325 mg total) by mouth daily with breakfast. Please take till 06/21/2014, then transition back to low dose baby aspirin.   ASPIRIN EC 81 MG TABLET    Take  1 tablet (81 mg total) by mouth daily.   BISACODYL (DULCOLAX) 10 MG SUPPOSITORY    Place 1 suppository (10 mg total) rectally daily as needed for moderate constipation.   DOCUSATE SODIUM (COLACE) 100 MG CAPSULE    Take 1 capsule (100 mg total) by mouth 2 (two) times daily. Continue this while taking narcotics to help with bowel movements   FEEDING SUPPLEMENT, ENSURE COMPLETE, (ENSURE COMPLETE) LIQD    Take 237 mLs by mouth 2 (two) times daily between meals.    HYDROCHLOROTHIAZIDE (MICROZIDE) 12.5 MG CAPSULE    Take 1 capsule (12.5 mg total) by mouth daily.   HYDROCODONE-ACETAMINOPHEN (NORCO) 5-325 MG PER TABLET    Take 1 tablet by mouth every 4 (four) hours as needed for moderate pain.   METOPROLOL TARTRATE (LOPRESSOR) 25 MG TABLET    Take 1 tablet (25 mg total) by mouth 2 (two) times daily.   POLYETHYLENE GLYCOL (MIRALAX / GLYCOLAX) PACKET    Take 17 g by mouth daily as needed for moderate constipation.   POTASSIUM CHLORIDE (K-DUR) 10 MEQ TABLET    Take 1 tablet (10 mEq total) by mouth daily.   SENNA-DOCUSATE (SENOKOT-S) 8.6-50 MG PER TABLET    Take 1 tablet by mouth at bedtime.  Modified Medications   No medications on file  Discontinued Medications   No medications on file     Physical Exam: Filed Vitals:   06/09/14 1225  BP: 115/60  Pulse: 72  Temp: 97.7 F (36.5 C)  Resp: 18  SpO2: 96%    General- elderly male in no acute distress Head- atraumatic, normocephalic Eyes- PERRLA, EOMI, no pallor, no icterus, no discharge Neck- no cervical lymphadenopathy Cardiovascular- normal s1,s2, no murmurs, palpable dorsalis pedis Respiratory- bilateral clear to auscultation, no wheeze, no rhonchi, no crackles, no use of accessory muscles Abdomen- bowel sounds present, soft, non tender Musculoskeletal- able to move all 4 extremities, left hip ROM limited, trace left leg edema, using a walker Neurological- no focal deficit Skin- warm and dry, mepilex dressing on left hip, no drainage noted, no redness around the dressing area, redness noted in groin area, no skin breakdown, no penile drainage Psychiatry- alert and oriented to person, place and time, normal mood and affect   Labs reviewed: Basic Metabolic Panel:  Recent Labs  08/20/13 2058  06/02/14 0241 06/03/14 0446 06/05/14 0300  NA 145  < > 138 133* 138  K 4.2  < > 3.7 3.9 4.2  CL 109  < > 104 102 103  CO2 23  < > 29 26 29   GLUCOSE 147*  < > 119* 103* 107*  BUN 45*  < > 28* 38*  40*  CREATININE 1.36*  < > 1.28 1.37* 1.27  CALCIUM 9.1  < > 8.4 8.5 8.6  MG 2.2  --   --   --   --   < > = values in this interval not displayed. Liver Function Tests:  Recent Labs  08/20/13 2058 05/31/14 1620 05/31/14 2030  AST 36 30 23  ALT 40 17 13  ALKPHOS 86 83 70  BILITOT 0.7 0.8 0.4  PROT 5.3* 6.7 5.5*  ALBUMIN 3.1* 3.7 2.9*   No results for input(s): LIPASE, AMYLASE in the last 8760 hours. No results for input(s): AMMONIA in the last 8760 hours. CBC:  Recent Labs  08/20/13 2058 05/31/14 1620 06/02/14 0241 06/03/14 0446 06/05/14 0300  WBC 5.1 6.6 6.3 10.2 7.6  NEUTROABS 3.4 4.7  --   --   --  HGB 11.6* 13.3 11.3* 11.2* 10.0*  HCT 34.1* 40.4 34.5* 34.0* 30.1*  MCV 91.2 91.0 91.8 91.4 90.9  PLT 151 224 207 183 234    Assessment/Plan  Left  hip fracture S/p left hip hemiarthroplasty 06/01/2014. Continue aspirin 325 mg daily for dvt prophylaxis. Has f/u with Dr Percell Miller. Will have patient work with PT/OT as tolerated to regain strength and restore function.  Fall precautions are in place. Continue posterior hip precautions. Continue norco 5-325 q4h prn pain  Groin rash Has fungal rash, will have him on nystatin cream bid for now and reassess, keep groin area dry  Constipation Has regular bowel movement. Change bisacodyl suppository to prn and colace to 100 mg daily for now. Change miralax to daily prn only. D/c senna s.  HTN bp controlled. Continue hctz 12.5 mg daily and metoprolol 25 mg bid  Hypokalemia Continue kcl supplement, check bmp next week  Anemia Likely post op from blood loss. Monitor h&h  Protein calorie malnutrition Continue protein supplement  Chronic combined systolic and diastolic heart failure Currently appears compensated. Continue metoprolol  History of paroxysmal atrial flutter Rate controlled. Continue metoprolol  complete heart block Status post dual-chamber pacemaker, implanted 08/2013  Family/ staff Communication:  reviewed care plan with patient and nursing supervisor   Goals of care: short term rehabilitation    Labs/tests ordered: cbc, bmp next week    Blanchie Serve, MD  [facility] [phone] (Monday-Friday 8 am - 5 pm) [phone] (afterhours)

## 2014-06-10 ENCOUNTER — Non-Acute Institutional Stay (SKILLED_NURSING_FACILITY): Payer: Medicare Other | Admitting: Adult Health

## 2014-06-10 ENCOUNTER — Encounter: Payer: Self-pay | Admitting: Adult Health

## 2014-06-10 DIAGNOSIS — S72002S Fracture of unspecified part of neck of left femur, sequela: Secondary | ICD-10-CM

## 2014-06-10 DIAGNOSIS — I5042 Chronic combined systolic (congestive) and diastolic (congestive) heart failure: Secondary | ICD-10-CM

## 2014-06-10 DIAGNOSIS — E46 Unspecified protein-calorie malnutrition: Secondary | ICD-10-CM

## 2014-06-10 DIAGNOSIS — K5900 Constipation, unspecified: Secondary | ICD-10-CM

## 2014-06-10 DIAGNOSIS — D62 Acute posthemorrhagic anemia: Secondary | ICD-10-CM

## 2014-06-10 DIAGNOSIS — R21 Rash and other nonspecific skin eruption: Secondary | ICD-10-CM | POA: Diagnosis not present

## 2014-06-10 DIAGNOSIS — I4892 Unspecified atrial flutter: Secondary | ICD-10-CM

## 2014-06-10 NOTE — Progress Notes (Signed)
Patient ID: Justin Orozco, male   DOB: 28-Dec-1926, 80 y.o.   MRN: [id number]   06/10/2014  Facility:  [facility] Location:  [place] Room Number: 703-P LEVEL OF CARE:  SNF (31)   Chief Complaint  Patient presents with  . Discharge Note    Left hip fracture S/P hemiarthroplasty, CHF, protein calorie malnutrition, paroxysmal atrial flutter and anemia    HISTORY OF PRESENT ILLNESS:  This is an 80 year old male who is for discharge home with home health PT, OT and CNA. He has been admitted to [facility]14 from [facility]. He presented to the ED with complaints of left hip pain 2 days with no history of fall/trauma. He has past medical history of complete heart block status post pacemaker placement, paroxysmal atrial flutter and heart failure. He was noted to have left hip fracture and now S/P left hip hemiarthroplasty. Patient was admitted to this facility for short-term rehabilitation after the patient's recent hospitalization.  Patient has completed SNF rehabilitation and therapy has cleared the patient for discharge.  PAST MEDICAL HISTORY:  Past Medical History  Diagnosis Date  . Atrial flutter   . BPH (benign prostatic hyperplasia)   . Complete heart block 08/2013    s/p STJ dual chamber pacemaker implant  . Paroxysmal atrial flutter   . Lower extremity edema   . CHF (congestive heart failure)   . Presence of permanent cardiac pacemaker   . Arthritis     "nothing serious" (05/31/2014)  . Prostate cancer     radiation    CURRENT MEDICATIONS: Reviewed per MAR/see medication list  No Known Allergies   REVIEW OF SYSTEMS:  GENERAL: no change in appetite, no fatigue, no weight changes, no fever, chills or weakness RESPIRATORY: no cough, SOB, DOE, wheezing, hemoptysis CARDIAC: no chest pain, or palpitations GI: no abdominal pain, diarrhea, constipation, heart burn, nausea or vomiting  PHYSICAL EXAMINATION  GENERAL: no  acute distress, normal body habitus EYES: conjunctivae normal, sclerae normal, normal eye lids NECK: supple, trachea midline, no neck masses, no thyroid tenderness, no thyromegaly LYMPHATICS: no LAN in the neck, no supraclavicular LAN RESPIRATORY: breathing is even & unlabored, BS CTAB CARDIAC: RRR, no murmur,no extra heart sounds, BLE edema 1+; left chest pacemaker GI: abdomen soft, normal BS, no masses, no tenderness, no hepatomegaly, no splenomegaly EXTREMITIES: Able to move 4 extremities PSYCHIATRIC: the patient is alert & oriented to person, affect & behavior appropriate  LABS/RADIOLOGY: Labs reviewed: Basic Metabolic Panel:  Recent Labs  08/20/13 2058  06/02/14 0241 06/03/14 0446 06/05/14 0300  NA 145  < > 138 133* 138  K 4.2  < > 3.7 3.9 4.2  CL 109  < > 104 102 103  CO2 23  < > 29 26 29   GLUCOSE 147*  < > 119* 103* 107*  BUN 45*  < > 28* 38* 40*  CREATININE 1.36*  < > 1.28 1.37* 1.27  CALCIUM 9.1  < > 8.4 8.5 8.6  MG 2.2  --   --   --   --   < > = values in this interval not displayed. Liver Function Tests:  Recent Labs  08/20/13 2058 05/31/14 1620 05/31/14 2030  AST 36 30 23  ALT 40 17 13  ALKPHOS 86 83 70  BILITOT 0.7 0.8 0.4  PROT 5.3* 6.7 5.5*  ALBUMIN 3.1* 3.7 2.9*   CBC:  Recent Labs  08/20/13 2058 05/31/14 1620 06/02/14 0241 06/03/14 0446  06/05/14 0300  WBC 5.1 6.6 6.3 10.2 7.6  NEUTROABS 3.4 4.7  --   --   --   HGB 11.6* 13.3 11.3* 11.2* 10.0*  HCT 34.1* 40.4 34.5* 34.0* 30.1*  MCV 91.2 91.0 91.8 91.4 90.9  PLT 151 224 207 183 234   Pelvis Portable  06/01/2014   CLINICAL DATA:  Status post left hip replacement  EXAM: PORTABLE PELVIS 1-2 VIEWS  COMPARISON:  09/06/2010  FINDINGS: A left hip replacement is noted. Old right hip replacement is again seen. No acute abnormality is seen. No soft tissue changes are noted.  IMPRESSION: Status post bilateral hip replacement. No acute abnormality is noted.   Electronically Signed   By: Inez Catalina  M.D.   On: 06/01/2014 12:52   Chest Portable 1 View  05/31/2014   CLINICAL DATA:  Preoperative exam, history of congestive heart failure  EXAM: PORTABLE CHEST - 1 VIEW  COMPARISON:  08/22/2013  FINDINGS: Left-sided pacer in place. Mild enlargement of the cardiomediastinal silhouette is identified. Patchy retrocardiac opacity is identified with trace left pleural effusion. No acute osseous abnormality.  IMPRESSION: Patchy left lower lobe airspace consolidation which could represent atelectasis although early pneumonia or edema could appear similar.   Electronically Signed   By: Conchita Paris M.D.   On: 05/31/2014 23:42    ASSESSMENT/PLAN:  Left hip fracture S/P hemiarthroplasty - for home health PT, OT and CNA; continue aspirin 325 mg by mouth daily by mouth 06/21/14 then aspirin 81 mg by mouth daily to start on 06/22/14 for DVT prophylaxis; continue Norco 5/325 mg 1 tab by mouth every 4 hours when necessary for pain Chronic combined systolic and diastolic heart failure - stable; continue metoprolol 25 mg by mouth twice a day  Hypertension  - continue HCTZ 12.5 mg by mouth daily Paroxysmal atrial flutter - rate controlled; continue metoprolol Anemia, acute blood loss - hemoglobin 10.0; stable Groin rash - continue nystatin Protein calorie malnutrition - continue supplementation   I have filled out patient's discharge paperwork and written prescriptions.  Patient will receive home health PT, OT and CNA.  Total discharge time: Less than 30 minutes  Discharge time involved coordination of the discharge process with Education officer, museum, nursing staff and therapy department. Medical justification for home health services verified.    [facility], NP [employer] [phone]

## 2014-06-11 DIAGNOSIS — Z471 Aftercare following joint replacement surgery: Secondary | ICD-10-CM | POA: Diagnosis not present

## 2014-06-11 DIAGNOSIS — Z8546 Personal history of malignant neoplasm of prostate: Secondary | ICD-10-CM | POA: Diagnosis not present

## 2014-06-11 DIAGNOSIS — R2681 Unsteadiness on feet: Secondary | ICD-10-CM | POA: Diagnosis not present

## 2014-06-11 DIAGNOSIS — I5042 Chronic combined systolic (congestive) and diastolic (congestive) heart failure: Secondary | ICD-10-CM | POA: Diagnosis not present

## 2014-06-11 DIAGNOSIS — I10 Essential (primary) hypertension: Secondary | ICD-10-CM | POA: Diagnosis not present

## 2014-06-11 DIAGNOSIS — M6281 Muscle weakness (generalized): Secondary | ICD-10-CM | POA: Diagnosis not present

## 2014-06-11 DIAGNOSIS — R531 Weakness: Secondary | ICD-10-CM | POA: Diagnosis not present

## 2014-06-11 DIAGNOSIS — Z950 Presence of cardiac pacemaker: Secondary | ICD-10-CM | POA: Diagnosis not present

## 2014-06-11 DIAGNOSIS — R278 Other lack of coordination: Secondary | ICD-10-CM | POA: Diagnosis not present

## 2014-06-11 DIAGNOSIS — I4891 Unspecified atrial fibrillation: Secondary | ICD-10-CM | POA: Diagnosis not present

## 2014-06-11 DIAGNOSIS — Z96642 Presence of left artificial hip joint: Secondary | ICD-10-CM | POA: Diagnosis not present

## 2014-06-11 DIAGNOSIS — K5900 Constipation, unspecified: Secondary | ICD-10-CM | POA: Diagnosis not present

## 2014-06-14 DIAGNOSIS — Z720 Tobacco use: Secondary | ICD-10-CM | POA: Diagnosis not present

## 2014-06-14 DIAGNOSIS — R531 Weakness: Secondary | ICD-10-CM | POA: Diagnosis not present

## 2014-06-14 DIAGNOSIS — R269 Unspecified abnormalities of gait and mobility: Secondary | ICD-10-CM | POA: Diagnosis not present

## 2014-06-14 DIAGNOSIS — I480 Paroxysmal atrial fibrillation: Secondary | ICD-10-CM | POA: Diagnosis not present

## 2014-06-14 DIAGNOSIS — Z950 Presence of cardiac pacemaker: Secondary | ICD-10-CM | POA: Diagnosis not present

## 2014-06-14 DIAGNOSIS — S72002D Fracture of unspecified part of neck of left femur, subsequent encounter for closed fracture with routine healing: Secondary | ICD-10-CM

## 2014-06-14 DIAGNOSIS — E46 Unspecified protein-calorie malnutrition: Secondary | ICD-10-CM

## 2014-06-15 DIAGNOSIS — I480 Paroxysmal atrial fibrillation: Secondary | ICD-10-CM | POA: Diagnosis not present

## 2014-06-15 DIAGNOSIS — Z950 Presence of cardiac pacemaker: Secondary | ICD-10-CM | POA: Diagnosis not present

## 2014-06-15 DIAGNOSIS — E46 Unspecified protein-calorie malnutrition: Secondary | ICD-10-CM | POA: Diagnosis not present

## 2014-06-15 DIAGNOSIS — S72002D Fracture of unspecified part of neck of left femur, subsequent encounter for closed fracture with routine healing: Secondary | ICD-10-CM | POA: Diagnosis not present

## 2014-06-15 DIAGNOSIS — R269 Unspecified abnormalities of gait and mobility: Secondary | ICD-10-CM | POA: Diagnosis not present

## 2014-06-15 DIAGNOSIS — R531 Weakness: Secondary | ICD-10-CM | POA: Diagnosis not present

## 2014-06-16 DIAGNOSIS — R269 Unspecified abnormalities of gait and mobility: Secondary | ICD-10-CM | POA: Diagnosis not present

## 2014-06-16 DIAGNOSIS — Z950 Presence of cardiac pacemaker: Secondary | ICD-10-CM | POA: Diagnosis not present

## 2014-06-16 DIAGNOSIS — S72002D Fracture of unspecified part of neck of left femur, subsequent encounter for closed fracture with routine healing: Secondary | ICD-10-CM | POA: Diagnosis not present

## 2014-06-16 DIAGNOSIS — E46 Unspecified protein-calorie malnutrition: Secondary | ICD-10-CM | POA: Diagnosis not present

## 2014-06-16 DIAGNOSIS — R531 Weakness: Secondary | ICD-10-CM | POA: Diagnosis not present

## 2014-06-16 DIAGNOSIS — I480 Paroxysmal atrial fibrillation: Secondary | ICD-10-CM | POA: Diagnosis not present

## 2014-06-17 DIAGNOSIS — S72002D Fracture of unspecified part of neck of left femur, subsequent encounter for closed fracture with routine healing: Secondary | ICD-10-CM | POA: Diagnosis not present

## 2014-06-17 DIAGNOSIS — I480 Paroxysmal atrial fibrillation: Secondary | ICD-10-CM | POA: Diagnosis not present

## 2014-06-17 DIAGNOSIS — R269 Unspecified abnormalities of gait and mobility: Secondary | ICD-10-CM | POA: Diagnosis not present

## 2014-06-17 DIAGNOSIS — Z950 Presence of cardiac pacemaker: Secondary | ICD-10-CM | POA: Diagnosis not present

## 2014-06-17 DIAGNOSIS — E46 Unspecified protein-calorie malnutrition: Secondary | ICD-10-CM | POA: Diagnosis not present

## 2014-06-17 DIAGNOSIS — R531 Weakness: Secondary | ICD-10-CM | POA: Diagnosis not present

## 2014-06-18 DIAGNOSIS — S72042D Displaced fracture of base of neck of left femur, subsequent encounter for closed fracture with routine healing: Secondary | ICD-10-CM | POA: Diagnosis not present

## 2014-06-21 ENCOUNTER — Encounter: Payer: Self-pay | Admitting: Internal Medicine

## 2014-06-21 DIAGNOSIS — S72002D Fracture of unspecified part of neck of left femur, subsequent encounter for closed fracture with routine healing: Secondary | ICD-10-CM | POA: Diagnosis not present

## 2014-06-21 DIAGNOSIS — Z950 Presence of cardiac pacemaker: Secondary | ICD-10-CM | POA: Diagnosis not present

## 2014-06-21 DIAGNOSIS — R531 Weakness: Secondary | ICD-10-CM | POA: Diagnosis not present

## 2014-06-21 DIAGNOSIS — I480 Paroxysmal atrial fibrillation: Secondary | ICD-10-CM | POA: Diagnosis not present

## 2014-06-21 DIAGNOSIS — R269 Unspecified abnormalities of gait and mobility: Secondary | ICD-10-CM | POA: Diagnosis not present

## 2014-06-21 DIAGNOSIS — E46 Unspecified protein-calorie malnutrition: Secondary | ICD-10-CM | POA: Diagnosis not present

## 2014-06-22 DIAGNOSIS — Z950 Presence of cardiac pacemaker: Secondary | ICD-10-CM | POA: Diagnosis not present

## 2014-06-22 DIAGNOSIS — S72002D Fracture of unspecified part of neck of left femur, subsequent encounter for closed fracture with routine healing: Secondary | ICD-10-CM | POA: Diagnosis not present

## 2014-06-22 DIAGNOSIS — E46 Unspecified protein-calorie malnutrition: Secondary | ICD-10-CM | POA: Diagnosis not present

## 2014-06-22 DIAGNOSIS — I480 Paroxysmal atrial fibrillation: Secondary | ICD-10-CM | POA: Diagnosis not present

## 2014-06-22 DIAGNOSIS — R269 Unspecified abnormalities of gait and mobility: Secondary | ICD-10-CM | POA: Diagnosis not present

## 2014-06-22 DIAGNOSIS — R531 Weakness: Secondary | ICD-10-CM | POA: Diagnosis not present

## 2014-06-24 DIAGNOSIS — R269 Unspecified abnormalities of gait and mobility: Secondary | ICD-10-CM | POA: Diagnosis not present

## 2014-06-24 DIAGNOSIS — R531 Weakness: Secondary | ICD-10-CM | POA: Diagnosis not present

## 2014-06-24 DIAGNOSIS — S72002D Fracture of unspecified part of neck of left femur, subsequent encounter for closed fracture with routine healing: Secondary | ICD-10-CM | POA: Diagnosis not present

## 2014-06-24 DIAGNOSIS — Z950 Presence of cardiac pacemaker: Secondary | ICD-10-CM | POA: Diagnosis not present

## 2014-06-24 DIAGNOSIS — E46 Unspecified protein-calorie malnutrition: Secondary | ICD-10-CM | POA: Diagnosis not present

## 2014-06-24 DIAGNOSIS — I480 Paroxysmal atrial fibrillation: Secondary | ICD-10-CM | POA: Diagnosis not present

## 2014-06-28 DIAGNOSIS — S72002D Fracture of unspecified part of neck of left femur, subsequent encounter for closed fracture with routine healing: Secondary | ICD-10-CM | POA: Diagnosis not present

## 2014-06-28 DIAGNOSIS — R269 Unspecified abnormalities of gait and mobility: Secondary | ICD-10-CM | POA: Diagnosis not present

## 2014-06-28 DIAGNOSIS — I480 Paroxysmal atrial fibrillation: Secondary | ICD-10-CM | POA: Diagnosis not present

## 2014-06-28 DIAGNOSIS — Z950 Presence of cardiac pacemaker: Secondary | ICD-10-CM | POA: Diagnosis not present

## 2014-06-28 DIAGNOSIS — E46 Unspecified protein-calorie malnutrition: Secondary | ICD-10-CM | POA: Diagnosis not present

## 2014-06-28 DIAGNOSIS — R531 Weakness: Secondary | ICD-10-CM | POA: Diagnosis not present

## 2014-06-29 NOTE — Progress Notes (Signed)
His hip fracture on the left was traumatic   Justin Orozco, D

## 2014-06-30 DIAGNOSIS — Z950 Presence of cardiac pacemaker: Secondary | ICD-10-CM | POA: Diagnosis not present

## 2014-06-30 DIAGNOSIS — R269 Unspecified abnormalities of gait and mobility: Secondary | ICD-10-CM | POA: Diagnosis not present

## 2014-06-30 DIAGNOSIS — E46 Unspecified protein-calorie malnutrition: Secondary | ICD-10-CM | POA: Diagnosis not present

## 2014-06-30 DIAGNOSIS — I480 Paroxysmal atrial fibrillation: Secondary | ICD-10-CM | POA: Diagnosis not present

## 2014-06-30 DIAGNOSIS — R531 Weakness: Secondary | ICD-10-CM | POA: Diagnosis not present

## 2014-06-30 DIAGNOSIS — S72002D Fracture of unspecified part of neck of left femur, subsequent encounter for closed fracture with routine healing: Secondary | ICD-10-CM | POA: Diagnosis not present

## 2014-07-11 ENCOUNTER — Other Ambulatory Visit: Payer: Self-pay | Admitting: Adult Health

## 2014-07-19 DIAGNOSIS — I4892 Unspecified atrial flutter: Secondary | ICD-10-CM | POA: Diagnosis not present

## 2014-07-19 DIAGNOSIS — Z6821 Body mass index (BMI) 21.0-21.9, adult: Secondary | ICD-10-CM | POA: Diagnosis not present

## 2014-07-19 DIAGNOSIS — E785 Hyperlipidemia, unspecified: Secondary | ICD-10-CM | POA: Diagnosis not present

## 2014-07-19 DIAGNOSIS — M1990 Unspecified osteoarthritis, unspecified site: Secondary | ICD-10-CM | POA: Diagnosis not present

## 2014-07-19 DIAGNOSIS — Z8546 Personal history of malignant neoplasm of prostate: Secondary | ICD-10-CM | POA: Diagnosis not present

## 2014-07-19 DIAGNOSIS — Z1389 Encounter for screening for other disorder: Secondary | ICD-10-CM | POA: Diagnosis not present

## 2014-07-19 DIAGNOSIS — R8299 Other abnormal findings in urine: Secondary | ICD-10-CM | POA: Diagnosis not present

## 2014-07-19 DIAGNOSIS — M810 Age-related osteoporosis without current pathological fracture: Secondary | ICD-10-CM | POA: Diagnosis not present

## 2014-07-19 DIAGNOSIS — N390 Urinary tract infection, site not specified: Secondary | ICD-10-CM | POA: Diagnosis not present

## 2014-07-22 DIAGNOSIS — R319 Hematuria, unspecified: Secondary | ICD-10-CM | POA: Diagnosis not present

## 2014-07-22 DIAGNOSIS — Z6820 Body mass index (BMI) 20.0-20.9, adult: Secondary | ICD-10-CM | POA: Diagnosis not present

## 2014-07-22 DIAGNOSIS — N390 Urinary tract infection, site not specified: Secondary | ICD-10-CM | POA: Diagnosis not present

## 2014-07-22 DIAGNOSIS — R310 Gross hematuria: Secondary | ICD-10-CM | POA: Diagnosis not present

## 2014-07-29 DIAGNOSIS — R310 Gross hematuria: Secondary | ICD-10-CM | POA: Diagnosis not present

## 2014-08-10 ENCOUNTER — Other Ambulatory Visit: Payer: Self-pay | Admitting: Cardiovascular Disease

## 2014-08-10 NOTE — Telephone Encounter (Signed)
Rx refill sent to patient pharmacy   

## 2014-09-08 DIAGNOSIS — N390 Urinary tract infection, site not specified: Secondary | ICD-10-CM | POA: Diagnosis not present

## 2014-09-08 DIAGNOSIS — R351 Nocturia: Secondary | ICD-10-CM | POA: Diagnosis not present

## 2014-09-17 ENCOUNTER — Encounter: Payer: Self-pay | Admitting: *Deleted

## 2014-09-19 ENCOUNTER — Other Ambulatory Visit: Payer: Self-pay | Admitting: Adult Health

## 2014-09-27 ENCOUNTER — Telehealth: Payer: Self-pay | Admitting: Nurse Practitioner

## 2014-09-27 NOTE — Telephone Encounter (Signed)
Spoke to daughter about recall. I informed her that we do need to check pt's ppm since he has not been in the office since July 2015. I asked if she could send a remote. Daughter states that she is unsure of how to do this. Patient to come in on 4/21 @ 1530 for ppm check w/ [facility]. Daughter voiced understanding.

## 2014-09-27 NOTE — Telephone Encounter (Signed)
New Message  Pt daughter calling to make sure recall in June is okay or needs earlier appt- prompted by letter from device. Please call back and dsicuss.

## 2014-09-30 ENCOUNTER — Ambulatory Visit (INDEPENDENT_AMBULATORY_CARE_PROVIDER_SITE_OTHER): Payer: Medicare Other | Admitting: *Deleted

## 2014-09-30 ENCOUNTER — Encounter: Payer: Self-pay | Admitting: Internal Medicine

## 2014-09-30 DIAGNOSIS — I442 Atrioventricular block, complete: Secondary | ICD-10-CM

## 2014-09-30 DIAGNOSIS — Z950 Presence of cardiac pacemaker: Secondary | ICD-10-CM | POA: Diagnosis not present

## 2014-09-30 DIAGNOSIS — I4892 Unspecified atrial flutter: Secondary | ICD-10-CM

## 2014-09-30 LAB — MDC_IDC_ENUM_SESS_TYPE_INCLINIC
Battery Remaining Longevity: 123.6 mo
Date Time Interrogation Session: 20160421162715
Implantable Pulse Generator Model: 2240
Implantable Pulse Generator Serial Number: 7596067
Lead Channel Impedance Value: 475 Ohm
Lead Channel Impedance Value: 575 Ohm
Lead Channel Pacing Threshold Amplitude: 0.5 V
Lead Channel Pacing Threshold Amplitude: 0.5 V
Lead Channel Pacing Threshold Amplitude: 1 V
Lead Channel Pacing Threshold Pulse Width: 0.5 ms
Lead Channel Pacing Threshold Pulse Width: 0.5 ms
Lead Channel Sensing Intrinsic Amplitude: 5 mV
Lead Channel Sensing Intrinsic Amplitude: 5.7 mV
Lead Channel Setting Pacing Amplitude: 2 V
MDC IDC MSMT BATTERY VOLTAGE: 3.01 V
MDC IDC MSMT LEADCHNL RA PACING THRESHOLD AMPLITUDE: 1 V
MDC IDC MSMT LEADCHNL RA PACING THRESHOLD PULSEWIDTH: 0.5 ms
MDC IDC MSMT LEADCHNL RA PACING THRESHOLD PULSEWIDTH: 0.5 ms
MDC IDC SET LEADCHNL RV PACING AMPLITUDE: 0.75 V
MDC IDC SET LEADCHNL RV PACING PULSEWIDTH: 0.5 ms
MDC IDC SET LEADCHNL RV SENSING SENSITIVITY: 5 mV
MDC IDC STAT BRADY RA PERCENT PACED: 18 %
MDC IDC STAT BRADY RV PERCENT PACED: 99.91 %

## 2014-09-30 NOTE — Progress Notes (Signed)
Pacemaker check in clinic. Normal device function. Thresholds, sensing, impedances consistent with previous measurements. Device programmed to maximize longevity. 627 mode switches--- <1%, longest 49min42sec, peak-A 256bpm. No high ventricular rates noted. Device programmed at appropriate safety margins. Histogram distribution appropriate for patient activity level. Device programmed to optimize intrinsic conduction. Estimated longevity 10.75yrs. ROV w/ AS in 32mo.

## 2014-10-29 DIAGNOSIS — I4892 Unspecified atrial flutter: Secondary | ICD-10-CM | POA: Diagnosis not present

## 2014-10-29 DIAGNOSIS — M1990 Unspecified osteoarthritis, unspecified site: Secondary | ICD-10-CM | POA: Diagnosis not present

## 2014-10-29 DIAGNOSIS — R109 Unspecified abdominal pain: Secondary | ICD-10-CM | POA: Diagnosis not present

## 2014-10-29 DIAGNOSIS — M810 Age-related osteoporosis without current pathological fracture: Secondary | ICD-10-CM | POA: Diagnosis not present

## 2014-10-29 DIAGNOSIS — E785 Hyperlipidemia, unspecified: Secondary | ICD-10-CM | POA: Diagnosis not present

## 2014-10-29 DIAGNOSIS — Z950 Presence of cardiac pacemaker: Secondary | ICD-10-CM | POA: Diagnosis not present

## 2014-10-29 DIAGNOSIS — Z8546 Personal history of malignant neoplasm of prostate: Secondary | ICD-10-CM | POA: Diagnosis not present

## 2014-11-03 DIAGNOSIS — K8020 Calculus of gallbladder without cholecystitis without obstruction: Secondary | ICD-10-CM | POA: Diagnosis not present

## 2014-11-03 DIAGNOSIS — K5790 Diverticulosis of intestine, part unspecified, without perforation or abscess without bleeding: Secondary | ICD-10-CM | POA: Diagnosis not present

## 2014-11-03 DIAGNOSIS — K4020 Bilateral inguinal hernia, without obstruction or gangrene, not specified as recurrent: Secondary | ICD-10-CM | POA: Diagnosis not present

## 2014-11-03 DIAGNOSIS — Z8546 Personal history of malignant neoplasm of prostate: Secondary | ICD-10-CM | POA: Diagnosis not present

## 2014-11-03 DIAGNOSIS — R933 Abnormal findings on diagnostic imaging of other parts of digestive tract: Secondary | ICD-10-CM | POA: Diagnosis not present

## 2014-12-27 DIAGNOSIS — R351 Nocturia: Secondary | ICD-10-CM | POA: Diagnosis not present

## 2014-12-27 DIAGNOSIS — N390 Urinary tract infection, site not specified: Secondary | ICD-10-CM | POA: Diagnosis not present

## 2014-12-27 DIAGNOSIS — C61 Malignant neoplasm of prostate: Secondary | ICD-10-CM | POA: Diagnosis not present

## 2015-03-03 DIAGNOSIS — Z23 Encounter for immunization: Secondary | ICD-10-CM | POA: Diagnosis not present

## 2015-03-09 DIAGNOSIS — Z6821 Body mass index (BMI) 21.0-21.9, adult: Secondary | ICD-10-CM | POA: Diagnosis not present

## 2015-03-09 DIAGNOSIS — Z950 Presence of cardiac pacemaker: Secondary | ICD-10-CM | POA: Diagnosis not present

## 2015-03-09 DIAGNOSIS — M810 Age-related osteoporosis without current pathological fracture: Secondary | ICD-10-CM | POA: Diagnosis not present

## 2015-03-09 DIAGNOSIS — M1990 Unspecified osteoarthritis, unspecified site: Secondary | ICD-10-CM | POA: Diagnosis not present

## 2015-03-09 DIAGNOSIS — I700 Atherosclerosis of aorta: Secondary | ICD-10-CM | POA: Diagnosis not present

## 2015-03-09 DIAGNOSIS — E785 Hyperlipidemia, unspecified: Secondary | ICD-10-CM | POA: Diagnosis not present

## 2015-03-09 DIAGNOSIS — Z23 Encounter for immunization: Secondary | ICD-10-CM | POA: Diagnosis not present

## 2015-03-09 DIAGNOSIS — I4892 Unspecified atrial flutter: Secondary | ICD-10-CM | POA: Diagnosis not present

## 2015-03-09 DIAGNOSIS — Z8546 Personal history of malignant neoplasm of prostate: Secondary | ICD-10-CM | POA: Diagnosis not present

## 2015-03-30 ENCOUNTER — Encounter: Payer: Self-pay | Admitting: Nurse Practitioner

## 2015-03-30 ENCOUNTER — Ambulatory Visit (INDEPENDENT_AMBULATORY_CARE_PROVIDER_SITE_OTHER): Payer: Medicare Other | Admitting: Nurse Practitioner

## 2015-03-30 VITALS — BP 110/70 | HR 68 | Ht 70.0 in | Wt 131.0 lb

## 2015-03-30 DIAGNOSIS — I483 Typical atrial flutter: Secondary | ICD-10-CM | POA: Diagnosis not present

## 2015-03-30 DIAGNOSIS — I442 Atrioventricular block, complete: Secondary | ICD-10-CM | POA: Diagnosis not present

## 2015-03-30 NOTE — Progress Notes (Signed)
Electrophysiology Office Note Date: 03/30/2015  ID:  [name], DOB [birth year], MRN [id number]  PCP: Justin Lyons, MD Primary Cardiologist: Justin Orozco Electrophysiologist: Justin Orozco  CC: Pacemaker follow-up  Justin Orozco is a 81 y.o. male seen today for Justin Orozco.  He presents today for routine electrophysiology followup.  Since last being seen in our clinic, the patient reports doing reasonably well.  He denies chest pain, palpitations, dyspnea, PND, orthopnea, nausea, vomiting, dizziness, syncope.  He is now living at [facility] in [place].   Echo 08/2013 demonstrated EF 75-64%, grade 1 diastolic dysfunction, mild MR.  Device History: STj dual chamber PPM implanted 2015 for complete heart block   Past Medical History  Diagnosis Date  . Atrial flutter (Justin Orozco)   . BPH (benign prostatic hyperplasia)   . Complete heart block ([place]) 08/2013    s/p STJ dual chamber pacemaker implant  . Paroxysmal atrial flutter (Justin Orozco)   . CHF (congestive heart failure) (Justin Orozco)   . Arthritis     "nothing serious" (05/31/2014)  . Prostate cancer [facility])     radiation   Past Surgical History  Procedure Laterality Date  . Total hip arthroplasty Right 08/2010    Justin. French Ana  . Transthoracic echocardiogram  08/2010    EF 55-60%, mod conc hypertrophy, grade 1 diastolic dysfunction, AV sclerosis, mild regurg; RA mildly dilated  . Nm myocar perf wall motion  07/2010    bruce myoview - perfusion defect in basal-apical inferior region (more at rest than stress) - non-gated (A-Flutter), low risk  . Permanent pacemaker insertion N/A 08/21/2013    STJ dual chamber PPM implanted for CHB  . Tonsillectomy  ~ 1935  . Anterior cruciate ligament repair Right ~ 1950  . Hip arthroplasty Left 06/01/2014    Procedure: LEFT HIP HEMI ARTHROPLASTY ;  Surgeon: Justin Butters, MD;  Location: [place];  Service: Orthopedics;  Laterality: Left;  Requesting Justin or April RNFA    Current Outpatient Prescriptions    Medication Sig Dispense Refill  . hydrALAZINE (APRESOLINE) 10 MG tablet Take 10 mg by mouth 2 (two) times daily.    . hydrochlorothiazide (MICROZIDE) 12.5 MG capsule TAKE 1 CAPSULE BY MOUTH DAILY 90 capsule 1  . mirabegron ER (MYRBETRIQ) 25 MG TB24 tablet Take 25 mg by mouth at bedtime.    . potassium chloride (K-DUR,KLOR-CON) 10 MEQ tablet TAKE 1 TABLET BY MOUTH DAILY 90 tablet 1   No current facility-administered medications for this visit.    Allergies:   Review of patient's allergies indicates no known allergies.   Social History: Social History   Social History  . Marital Status: Widowed    Spouse Name: N/A  . Number of Children: 2  . Years of Education: N/A   Occupational History  . Not on file.   Social History Main Topics  . Smoking status: Passive Smoke Exposure - Never Smoker  . Smokeless tobacco: Never Used  . Alcohol Use: 2.3 oz/week    3 Glasses of wine, 1 Standard drinks or equivalent per week  . Drug Use: No  . Sexual Activity: No   Other Topics Concern  . Not on file   Social History Narrative    Review of Systems: All other systems reviewed and are otherwise negative except as noted above.   Physical Exam: VS:  BP 110/70 mmHg  Pulse 68  Ht 5\' 10"  (1.778 m)  Wt 131 lb (59.421 kg)  BMI 18.80 kg/m2 , BMI Body mass index is  18.8 kg/(m^2).  GEN- The patient is elderly appearing, alert and oriented x 3 today.   HEENT: normocephalic, atraumatic; sclera clear, conjunctiva pink; hearing intact; oropharynx clear; neck supple  Lungs- Clear to ausculation bilaterally, normal work of breathing.  No wheezes, rales, rhonchi Heart- Regular rate and rhythm  GI- soft, non-tender, non-distended, bowel sounds present  Extremities- no clubbing, cyanosis, or edema; DP/PT/radial pulses 2+ bilaterally MS- no significant deformity or atrophy Skin- warm and dry, no rash or lesion; PPM pocket well healed Psych- euthymic mood, full affect Neuro- strength and sensation  are intact  PPM Interrogation- reviewed in detail today,  See PACEART report  EKG:  EKG is ordered today. EKG today demonstrates SR w V pacing, rate 68  Recent Labs: 05/31/2014: ALT 13 06/05/2014: BUN 40*; Creatinine, Ser 1.27; Hemoglobin 10.0*; Platelets 234; Potassium 4.2; Sodium 138   Wt Readings from Last 3 Encounters:  03/30/15 131 lb (59.421 kg)  06/10/14 135 lb 3.2 oz (61.326 kg)  06/08/14 130 lb (58.968 kg)     Other studies Reviewed: Additional studies/ records that were reviewed today include: Justin Justin Orozco office notes, last echo  Assessment and Plan:  1.  Complete heart block Normal PPM function See Pace Art report No changes today  2.  Atrial flutter Continues to be asymptomatic Episodes are all < 20 minutes CHADS2VASC is 3.  If he has increased atrial arrhythmia burden, may need to consider [place].    Current medicines are reviewed at length with the patient today.   The patient does not have concerns regarding his medicines.  The following changes were made today:  none  Labs/ tests ordered today include: none   Disposition:   Follow up with Justin Orozco in 1 year. The patient continues to decline remote monitoring  Signed, Chanetta Marshall, NP 03/30/2015 1:54 PM  [facility] [address] [place] [address] [phone] (office) [phone] (fax)

## 2015-03-30 NOTE — Patient Instructions (Signed)
Medication Instructions: Your physician recommends that you continue on your current medications as directed. Please refer to the Current Medication list given to you today.    Labwork: NONE ORDER TODAY    Testing/Procedures: NONE ORDER TODAY    Follow-Up:  Your physician wants you to follow-up in: [place] will receive a reminder letter in the mail two months in advance. If you don't receive a letter, please call our office to schedule the follow-up appointment.   Any Other Special Instructions Will Be Listed Below (If Applicable).

## 2015-04-04 LAB — CUP PACEART INCLINIC DEVICE CHECK
Date Time Interrogation Session: 20161024131203
Implantable Lead Implant Date: 20150313
Implantable Lead Implant Date: 20150313
Implantable Lead Location: 753859
Implantable Lead Model: 1944
Lead Channel Setting Pacing Amplitude: 2 V
Lead Channel Setting Pacing Pulse Width: 0.5 ms
MDC IDC LEAD LOCATION: 753860
MDC IDC PG SERIAL: 7596067
MDC IDC SET LEADCHNL RV PACING AMPLITUDE: 0.75 V
MDC IDC SET LEADCHNL RV SENSING SENSITIVITY: 5 mV
Pulse Gen Model: 2240

## 2015-04-05 ENCOUNTER — Other Ambulatory Visit: Payer: Self-pay | Admitting: Cardiovascular Disease

## 2015-04-05 NOTE — Telephone Encounter (Signed)
E-sent to pharmacy  needs annual appointment last visit 01/2014

## 2015-04-13 IMAGING — CR DG CHEST 2V
2 series · 2 of 2 positions shown · non-contrast
Comparison: 08/20/2013

CLINICAL DATA: Status post defibrillator placement

EXAM:
CHEST  2 VIEW

[w chest pa]
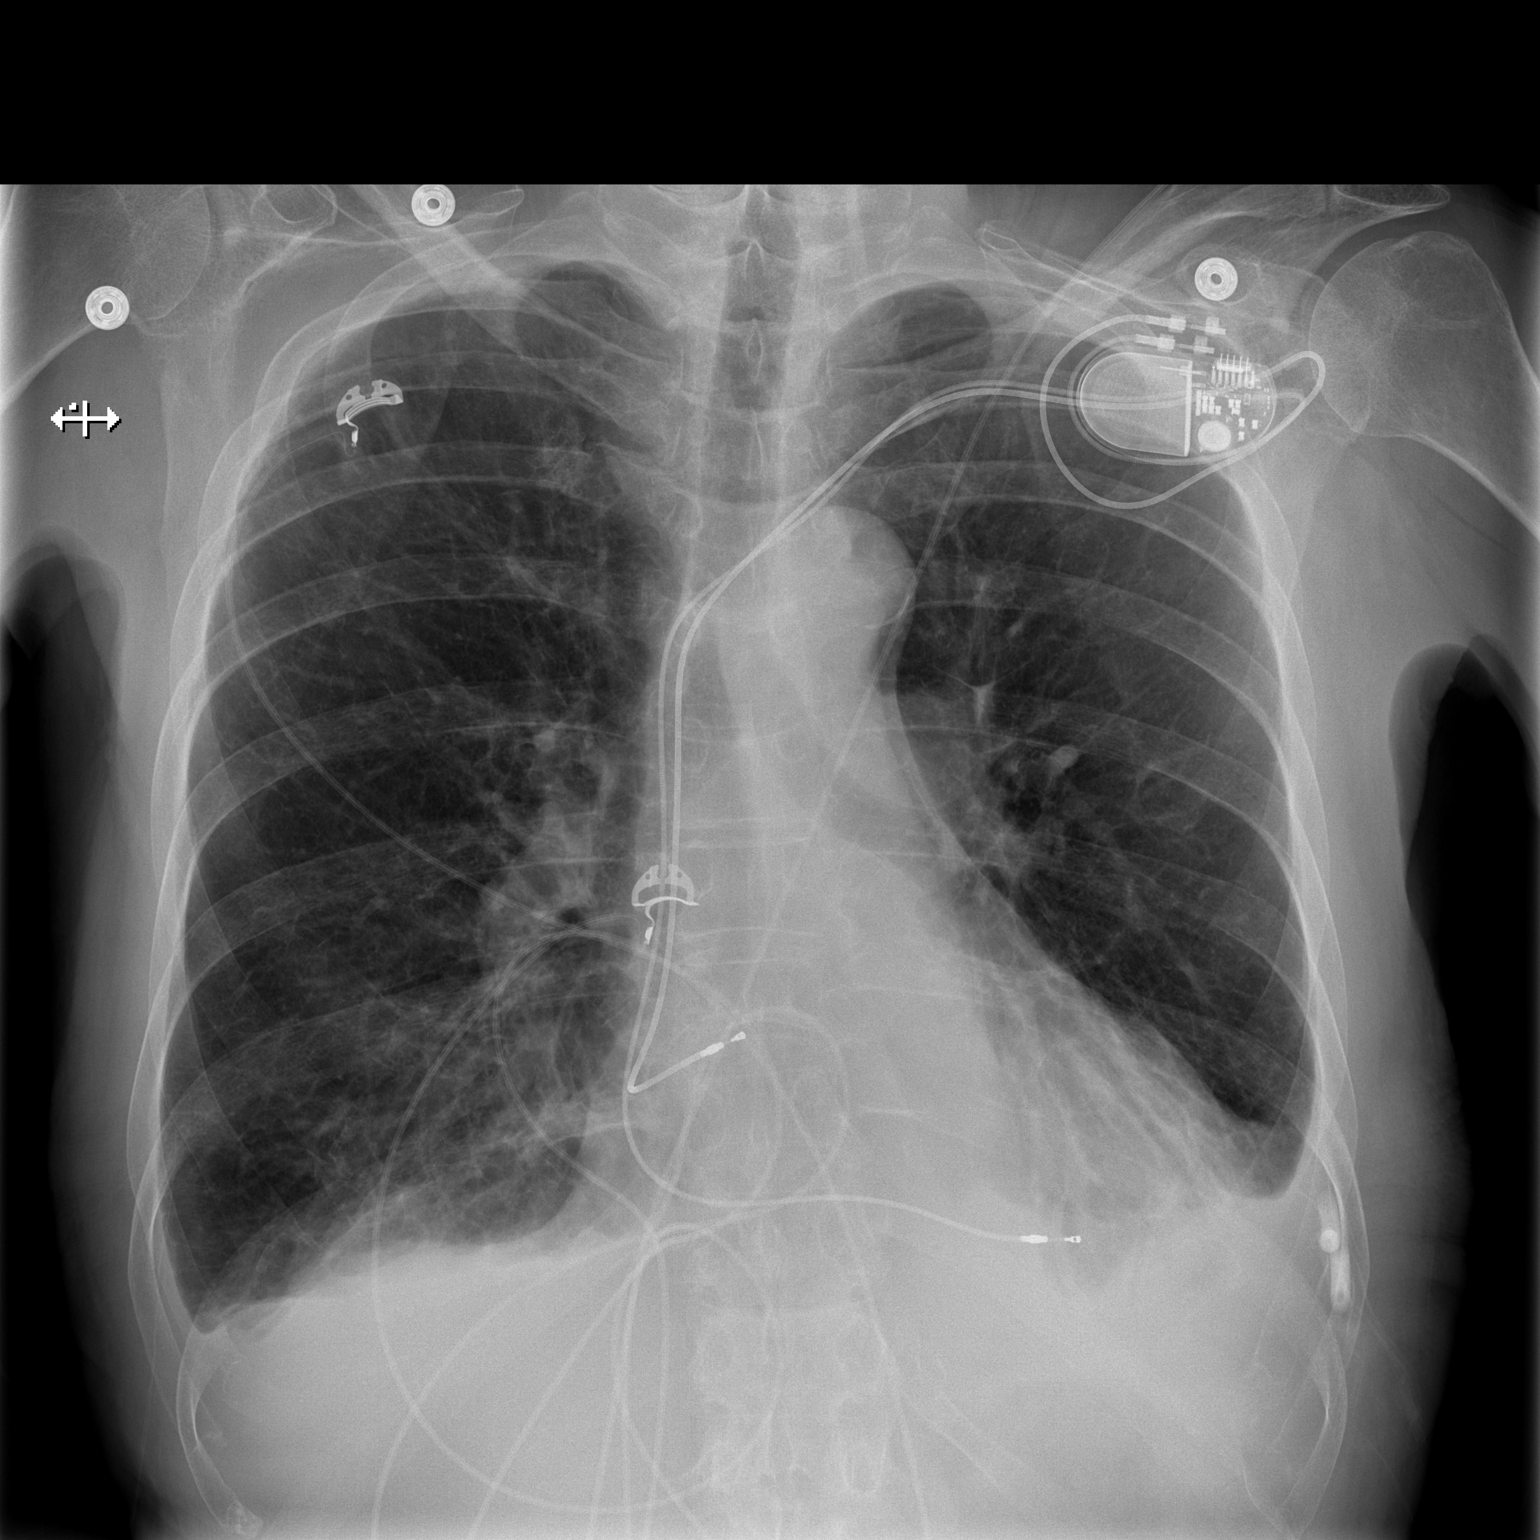

[w chest lat]
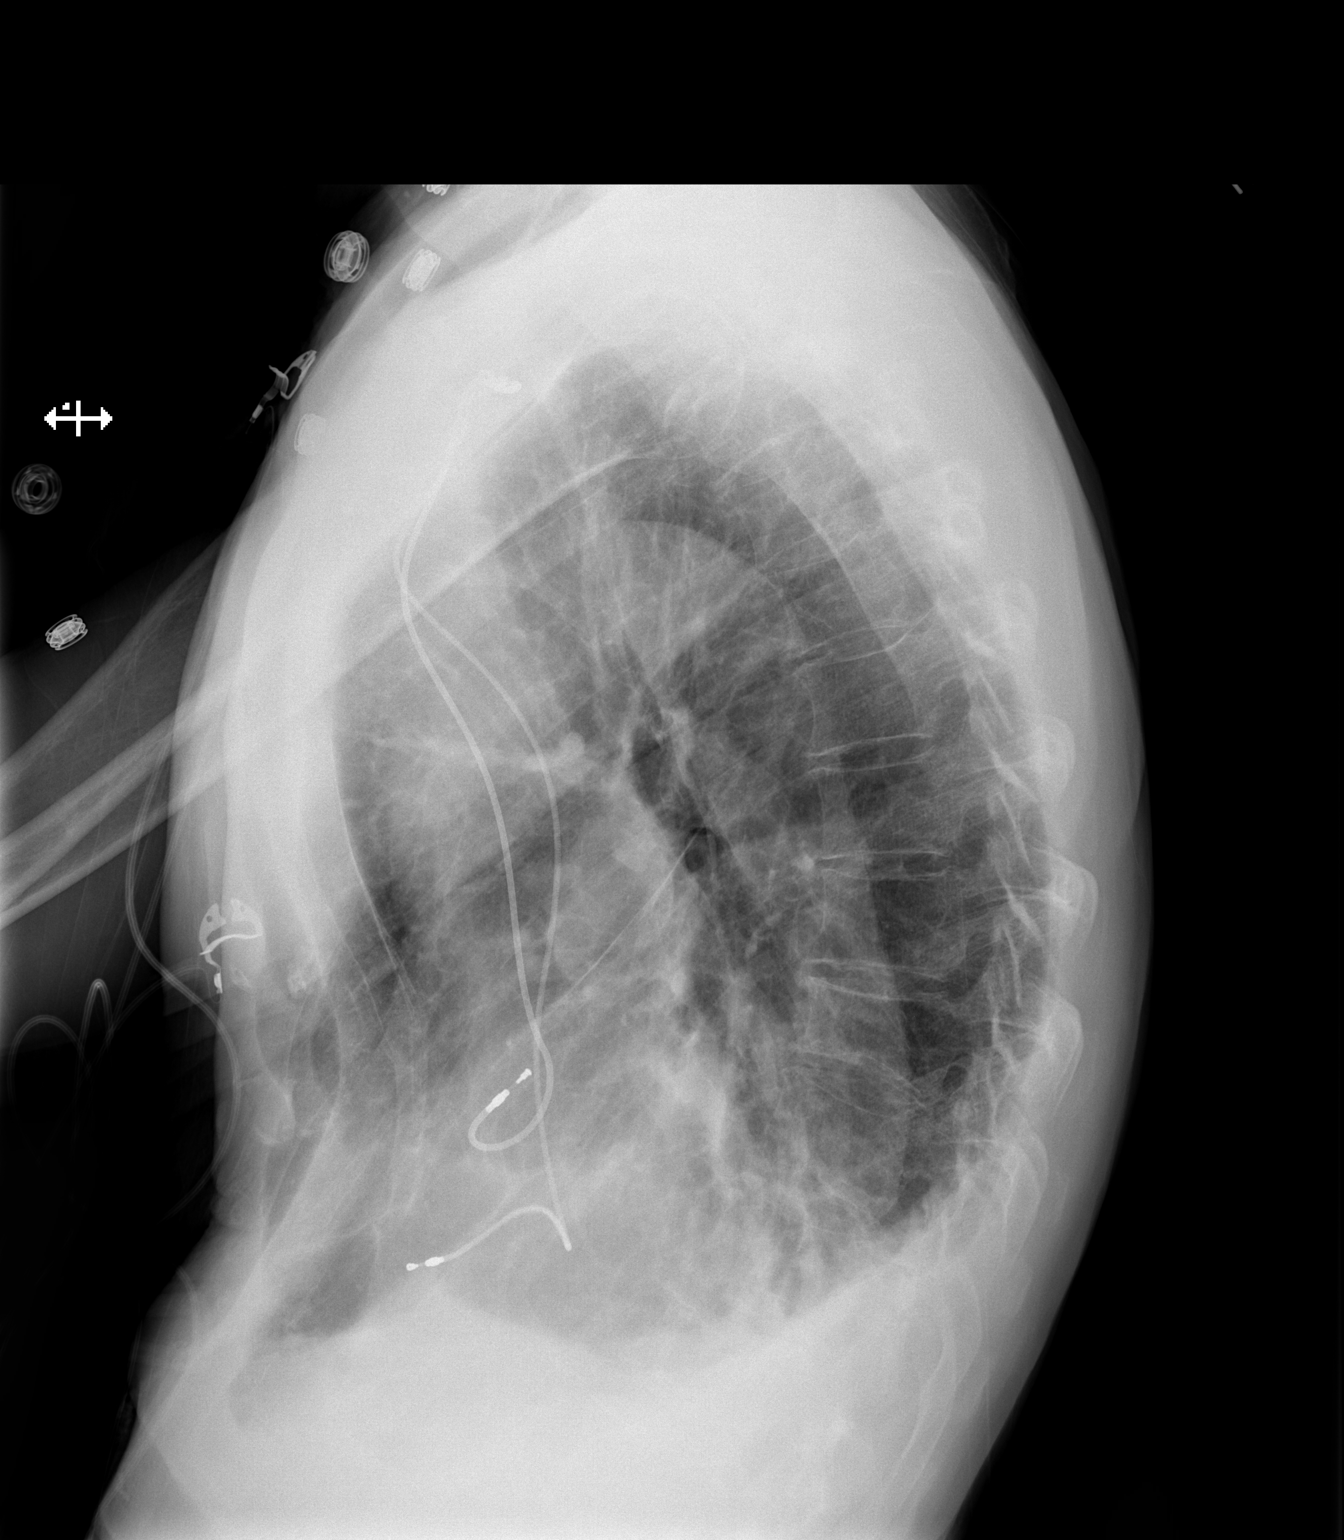

[2 of 2 positions shown; findings below may reference images not displayed]

FINDINGS: Cardiac shadow is stable. Chronic changes are noted in the bases
bilaterally. No new focal infiltrate is seen. No pneumothorax is
noted.
IMPRESSION: No acute abnormality noted.

## 2015-04-21 ENCOUNTER — Encounter: Payer: Self-pay | Admitting: Internal Medicine

## 2015-06-07 ENCOUNTER — Other Ambulatory Visit: Payer: Self-pay | Admitting: Cardiovascular Disease

## 2015-06-07 NOTE — Telephone Encounter (Signed)
E-sent pharmacy  refill 30 days only need appointment.

## 2015-07-02 ENCOUNTER — Other Ambulatory Visit: Payer: Self-pay | Admitting: Cardiovascular Disease

## 2015-07-04 NOTE — Telephone Encounter (Signed)
REFILL 

## 2015-07-12 DIAGNOSIS — M810 Age-related osteoporosis without current pathological fracture: Secondary | ICD-10-CM | POA: Diagnosis not present

## 2015-07-12 DIAGNOSIS — Z125 Encounter for screening for malignant neoplasm of prostate: Secondary | ICD-10-CM | POA: Diagnosis not present

## 2015-07-12 DIAGNOSIS — N390 Urinary tract infection, site not specified: Secondary | ICD-10-CM | POA: Diagnosis not present

## 2015-07-12 DIAGNOSIS — R8290 Unspecified abnormal findings in urine: Secondary | ICD-10-CM | POA: Diagnosis not present

## 2015-07-12 DIAGNOSIS — E784 Other hyperlipidemia: Secondary | ICD-10-CM | POA: Diagnosis not present

## 2015-07-12 LAB — PSA: PSA: 0.7

## 2015-07-18 DIAGNOSIS — I4892 Unspecified atrial flutter: Secondary | ICD-10-CM | POA: Diagnosis not present

## 2015-07-18 DIAGNOSIS — I459 Conduction disorder, unspecified: Secondary | ICD-10-CM | POA: Diagnosis not present

## 2015-07-18 DIAGNOSIS — I10 Essential (primary) hypertension: Secondary | ICD-10-CM | POA: Diagnosis not present

## 2015-07-18 DIAGNOSIS — Z1389 Encounter for screening for other disorder: Secondary | ICD-10-CM | POA: Diagnosis not present

## 2015-07-18 DIAGNOSIS — I700 Atherosclerosis of aorta: Secondary | ICD-10-CM | POA: Diagnosis not present

## 2015-07-18 DIAGNOSIS — R600 Localized edema: Secondary | ICD-10-CM | POA: Diagnosis not present

## 2015-07-18 DIAGNOSIS — M1990 Unspecified osteoarthritis, unspecified site: Secondary | ICD-10-CM | POA: Diagnosis not present

## 2015-07-18 DIAGNOSIS — Z0000 Encounter for general adult medical examination without abnormal findings: Secondary | ICD-10-CM | POA: Diagnosis not present

## 2015-07-18 DIAGNOSIS — E785 Hyperlipidemia, unspecified: Secondary | ICD-10-CM | POA: Diagnosis not present

## 2015-07-18 DIAGNOSIS — N183 Chronic kidney disease, stage 3 (moderate): Secondary | ICD-10-CM | POA: Diagnosis not present

## 2015-07-18 DIAGNOSIS — I129 Hypertensive chronic kidney disease with stage 1 through stage 4 chronic kidney disease, or unspecified chronic kidney disease: Secondary | ICD-10-CM | POA: Diagnosis not present

## 2015-08-05 ENCOUNTER — Other Ambulatory Visit: Payer: Self-pay | Admitting: Cardiovascular Disease

## 2015-08-05 NOTE — Telephone Encounter (Signed)
REFILL 

## 2015-11-04 DIAGNOSIS — N390 Urinary tract infection, site not specified: Secondary | ICD-10-CM | POA: Diagnosis not present

## 2015-11-04 DIAGNOSIS — R300 Dysuria: Secondary | ICD-10-CM | POA: Diagnosis not present

## 2015-12-06 ENCOUNTER — Telehealth: Payer: Self-pay | Admitting: Cardiovascular Disease

## 2015-12-06 ENCOUNTER — Other Ambulatory Visit: Payer: Self-pay

## 2015-12-06 ENCOUNTER — Observation Stay (HOSPITAL_COMMUNITY)
Admission: EM | Admit: 2015-12-06 | Discharge: 2015-12-07 | Disposition: A | Discharge status: Home / Self Care | Payer: Medicare Other | Attending: Cardiovascular Disease | Admitting: Cardiovascular Disease

## 2015-12-06 ENCOUNTER — Ambulatory Visit (INDEPENDENT_AMBULATORY_CARE_PROVIDER_SITE_OTHER): Payer: Medicare Other | Admitting: *Deleted

## 2015-12-06 ENCOUNTER — Encounter (HOSPITAL_COMMUNITY): Payer: Self-pay | Admitting: *Deleted

## 2015-12-06 ENCOUNTER — Emergency Department (HOSPITAL_COMMUNITY): Payer: Medicare Other

## 2015-12-06 DIAGNOSIS — N179 Acute kidney failure, unspecified: Secondary | ICD-10-CM | POA: Insufficient documentation

## 2015-12-06 DIAGNOSIS — Z7982 Long term (current) use of aspirin: Secondary | ICD-10-CM | POA: Insufficient documentation

## 2015-12-06 DIAGNOSIS — I442 Atrioventricular block, complete: Secondary | ICD-10-CM

## 2015-12-06 DIAGNOSIS — Z7722 Contact with and (suspected) exposure to environmental tobacco smoke (acute) (chronic): Secondary | ICD-10-CM | POA: Diagnosis not present

## 2015-12-06 DIAGNOSIS — I4892 Unspecified atrial flutter: Secondary | ICD-10-CM | POA: Diagnosis not present

## 2015-12-06 DIAGNOSIS — R0789 Other chest pain: Secondary | ICD-10-CM | POA: Diagnosis not present

## 2015-12-06 DIAGNOSIS — N189 Chronic kidney disease, unspecified: Secondary | ICD-10-CM

## 2015-12-06 DIAGNOSIS — Z8546 Personal history of malignant neoplasm of prostate: Secondary | ICD-10-CM | POA: Diagnosis not present

## 2015-12-06 DIAGNOSIS — I509 Heart failure, unspecified: Secondary | ICD-10-CM | POA: Diagnosis not present

## 2015-12-06 DIAGNOSIS — I499 Cardiac arrhythmia, unspecified: Secondary | ICD-10-CM | POA: Insufficient documentation

## 2015-12-06 DIAGNOSIS — Z96643 Presence of artificial hip joint, bilateral: Secondary | ICD-10-CM | POA: Insufficient documentation

## 2015-12-06 DIAGNOSIS — R600 Localized edema: Secondary | ICD-10-CM | POA: Diagnosis present

## 2015-12-06 DIAGNOSIS — I5042 Chronic combined systolic (congestive) and diastolic (congestive) heart failure: Secondary | ICD-10-CM

## 2015-12-06 DIAGNOSIS — R079 Chest pain, unspecified: Principal | ICD-10-CM | POA: Diagnosis present

## 2015-12-06 DIAGNOSIS — Z950 Presence of cardiac pacemaker: Secondary | ICD-10-CM

## 2015-12-06 LAB — BASIC METABOLIC PANEL
Anion gap: 8 (ref 5–15)
BUN: 36 mg/dL — AB (ref 6–20)
CHLORIDE: 105 mmol/L (ref 101–111)
CO2: 26 mmol/L (ref 22–32)
CREATININE: 1.69 mg/dL — AB (ref 0.61–1.24)
Calcium: 10.1 mg/dL (ref 8.9–10.3)
GFR calc Af Amer: 40 mL/min — ABNORMAL LOW (ref >60)
GFR, EST NON AFRICAN AMERICAN: 35 mL/min — AB (ref >60)
GLUCOSE: 151 mg/dL — AB (ref 65–99)
POTASSIUM: 4 mmol/L (ref 3.5–5.1)
Sodium: 139 mmol/L (ref 135–145)

## 2015-12-06 LAB — CBC WITH DIFFERENTIAL/PLATELET
Basophils Absolute: 0 10*3/uL (ref 0.0–0.1)
Basophils Relative: 0 %
EOS PCT: 0 %
Eosinophils Absolute: 0 10*3/uL (ref 0.0–0.7)
HCT: 40.9 % (ref 39.0–52.0)
Hemoglobin: 13.7 g/dL (ref 13.0–17.0)
LYMPHS ABS: 1.8 10*3/uL (ref 0.7–4.0)
LYMPHS PCT: 19 %
MCH: 30.2 pg (ref 26.0–34.0)
MCHC: 33.5 g/dL (ref 30.0–36.0)
MCV: 90.3 fL (ref 78.0–100.0)
MONOS PCT: 7 %
Monocytes Absolute: 0.6 10*3/uL (ref 0.1–1.0)
Neutro Abs: 7 10*3/uL (ref 1.7–7.7)
Neutrophils Relative %: 74 %
PLATELETS: 208 10*3/uL (ref 150–400)
RBC: 4.53 MIL/uL (ref 4.22–5.81)
RDW: 14.8 % (ref 11.5–15.5)
WBC: 9.5 10*3/uL (ref 4.0–10.5)

## 2015-12-06 LAB — TROPONIN I
TROPONIN I: 0.03 ng/mL — AB (ref <0.03)
Troponin I: 0.04 ng/mL (ref <0.03)
Troponin I: 0.03 ng/mL (ref <0.03)

## 2015-12-06 MED ORDER — NITROGLYCERIN 0.4 MG SL SUBL: 0.4000 mg | SUBLINGUAL_TABLET | SUBLINGUAL | Status: DC | PRN

## 2015-12-06 MED ORDER — ASPIRIN 300 MG RE SUPP: 300.0000 mg | RECTAL | Status: AC

## 2015-12-06 MED ORDER — HYDRALAZINE HCL 10 MG PO TABS
10.0000 mg | ORAL_TABLET | Freq: Two times a day (BID) | ORAL | Status: DC
  Administered 2015-12-06 – 2015-12-07 (×2): 10 mg via ORAL
  Filled 2015-12-06 (×2): qty 1

## 2015-12-06 MED ORDER — ASPIRIN 81 MG PO CHEW: 324.0000 mg | CHEWABLE_TABLET | ORAL | Status: AC

## 2015-12-06 MED ORDER — ACETAMINOPHEN 325 MG PO TABS: 650.0000 mg | ORAL_TABLET | ORAL | Status: DC | PRN

## 2015-12-06 MED ORDER — MIRABEGRON ER 25 MG PO TB24
25.0000 mg | ORAL_TABLET | Freq: Every day | ORAL | Status: DC
Start: 2015-12-06 — End: 2015-12-07
  Administered 2015-12-06: 25 mg via ORAL
  Filled 2015-12-06: qty 1

## 2015-12-06 MED ORDER — SODIUM CHLORIDE 0.9 % IV SOLN
INTRAVENOUS | Status: AC
  Administered 2015-12-06: 17:00:00 via INTRAVENOUS

## 2015-12-06 MED ORDER — HEPARIN SODIUM (PORCINE) 5000 UNIT/ML IJ SOLN
5000.0000 [IU] | Freq: Three times a day (TID) | INTRAMUSCULAR | Status: DC
  Administered 2015-12-06 – 2015-12-07 (×2): 5000 [IU] via SUBCUTANEOUS
  Filled 2015-12-06 (×2): qty 1

## 2015-12-06 MED ORDER — ASPIRIN EC 81 MG PO TBEC
81.0000 mg | DELAYED_RELEASE_TABLET | Freq: Every day | ORAL | Status: DC
  Administered 2015-12-07: 81 mg via ORAL
  Filled 2015-12-06: qty 1

## 2015-12-06 MED ORDER — HYDROCHLOROTHIAZIDE 12.5 MG PO CAPS
12.5000 mg | ORAL_CAPSULE | Freq: Every day | ORAL | Status: DC
  Administered 2015-12-07: 12.5 mg via ORAL
  Filled 2015-12-06: qty 1

## 2015-12-06 MED ORDER — ONDANSETRON HCL 4 MG/2ML IJ SOLN: 4.0000 mg | Freq: Four times a day (QID) | INTRAMUSCULAR | Status: DC | PRN

## 2015-12-06 NOTE — Telephone Encounter (Signed)
New message   Justin Orozco is calling because he was having some chest pain and he refused to go to the hospital  The son is at the facility and is requesting a call back  She is not with pt now when i asked if he was having the chest pain now, it happen an hour ago

## 2015-12-06 NOTE — ED Provider Notes (Signed)
CSN: [id number]     Arrival date & time 12/06/15  1121 History   First MD Initiated Contact with Patient 12/06/15 1127     Chief Complaint  Patient presents with  . Chest Pain      HPI Patient presents with episode of left-sided chest pain. Began last night. States he felt a little sharp at his pacemaker site. States he assumed his pacemaker was failing. Also had slight radiation down the arm. No numbness or weakness. No shortness of breath. No fevers. Has not had problems like this before. There appears to be some dementia with this. Has a [employer] Jude pacemaker for atrial flutter and complete heart block. No dysuria. No nausea vomiting. States he would really like a gin and tonic.   Past Medical History  Diagnosis Date  . Atrial flutter ([place])   . BPH (benign prostatic hyperplasia)   . Complete heart block ([place]) 08/2013    s/p STJ dual chamber pacemaker implant  . Paroxysmal atrial flutter ([place])   . CHF (congestive heart failure) ([place])   . Arthritis     "nothing serious" (05/31/2014)  . Prostate cancer [facility])     radiation   Past Surgical History  Procedure Laterality Date  . Total hip arthroplasty Right 08/2010    Dr. French Ana  . Transthoracic echocardiogram  08/2010    EF 55-60%, mod conc hypertrophy, grade 1 diastolic dysfunction, AV sclerosis, mild regurg; RA mildly dilated  . Nm myocar perf wall motion  07/2010    bruce myoview - perfusion defect in basal-apical inferior region (more at rest than stress) - non-gated (A-Flutter), low risk  . Permanent pacemaker insertion N/A 08/21/2013    STJ dual chamber PPM implanted for CHB  . Tonsillectomy  ~ 1935  . Anterior cruciate ligament repair Right ~ 1950  . Hip arthroplasty Left 06/01/2014    Procedure: LEFT HIP HEMI ARTHROPLASTY ;  Surgeon: Renette Butters, MD;  Location: [place];  Service: Orthopedics;  Laterality: Left;  Requesting Larkin Ina or April RNFA   History reviewed. No pertinent family history. Social History  Substance Use  Topics  . Smoking status: Passive Smoke Exposure - Never Smoker  . Smokeless tobacco: Never Used  . Alcohol Use: 2.3 oz/week    3 Glasses of wine, 1 Standard drinks or equivalent per week    Review of Systems  Constitutional: Negative for diaphoresis and appetite change.  Respiratory: Negative for chest tightness.   Cardiovascular: Negative for chest pain.  Gastrointestinal: Negative for abdominal pain.  Genitourinary: Negative for frequency.  Musculoskeletal: Negative for back pain.  Skin: Negative for wound.  Hematological: Negative for adenopathy.      Allergies  Review of patient's allergies indicates no known allergies.  Home Medications   Prior to Admission medications   Medication Sig Start Date End Date Taking? Authorizing Provider  aspirin 81 MG tablet Take 81 mg by mouth daily.   Yes Historical Provider, MD  hydrALAZINE (APRESOLINE) 10 MG tablet Take 10 mg by mouth 2 (two) times daily.   Yes Historical Provider, MD  hydrochlorothiazide (MICROZIDE) 12.5 MG capsule TAKE 1 CAPSULE BY MOUTH DAILY 07/04/15  Yes Lorretta Harp, MD  mirabegron ER (MYRBETRIQ) 25 MG TB24 tablet Take 25 mg by mouth at bedtime.   Yes Historical Provider, MD  potassium chloride (K-DUR,KLOR-CON) 10 MEQ tablet TAKE 1 TABLET BY MOUTH EVERY DAY 08/05/15  Yes Lorretta Harp, MD   BP 131/78 mmHg  Pulse 60  Temp(Src) 98.4 F (36.9  C) (Oral)  Resp 16  SpO2 100% Physical Exam  Constitutional: He appears well-developed.  HENT:  Head: Atraumatic.  Eyes: EOM are normal.  Cardiovascular: Normal rate.   Pulmonary/Chest: Effort normal.  Abdominal: Soft.  Musculoskeletal: Normal range of motion.  Neurological: He is alert.  Skin: Skin is warm.    ED Course  Procedures (including critical care time) Labs Review Labs Reviewed  TROPONIN I - Abnormal; Notable for the following:    Troponin I 0.04 (*)    All other components within normal limits  BASIC METABOLIC PANEL - Abnormal; Notable for the  following:    Glucose, Bld 151 (*)    BUN 36 (*)    Creatinine, Ser 1.69 (*)    GFR calc non Af Amer 35 (*)    GFR calc Af Amer 40 (*)    All other components within normal limits  TROPONIN I - Abnormal; Notable for the following:    Troponin I 0.03 (*)    All other components within normal limits  CBC WITH DIFFERENTIAL/PLATELET    Imaging Review Dg Chest 2 View  12/06/2015  CLINICAL DATA:  Left side chest pain beginning last night. EXAM: CHEST  2 VIEW COMPARISON:  05/31/2014 FINDINGS: There is hyperinflation of the lungs compatible with COPD. Left pacer remains in place, unchanged. Heart and mediastinal contours are within normal limits. No focal opacities or effusions. No acute bony abnormality. IMPRESSION: No active cardiopulmonary disease.  COPD. Electronically Signed   By: Rolm Baptise M.D.   On: 12/06/2015 12:34   I have personally reviewed and evaluated these images and lab results as part of my medical decision-making.   EKG Interpretation   Date/Time:  Tuesday December 06 2015 11:26:53 EDT Ventricular Rate:  96 PR Interval:  178 QRS Duration: 148 QT Interval:  400 QTC Calculation: 505 R Axis:   -90 Text Interpretation:  Atrial-sensed ventricular-paced rhythm Abnormal ECG  Confirmed by Harumi Yamin  MD, Ovid Curd [phone]) on 12/06/2015 11:36:37 AM      MDM   Final diagnoses:  Chest pain, unspecified chest pain type  Acute on chronic renal failure [facility])    Patient with episode of chest pain. EKG paced. Troponin minimally elevated. Creatinine has increased. Has a history of mild heart failure. Seen by Cardiology and will be admitted.     Davonna Belling, MD 12/06/15 [phone]

## 2015-12-06 NOTE — ED Notes (Signed)
Pt reports last night he had CP and LT ARM tingling.

## 2015-12-06 NOTE — Telephone Encounter (Signed)
Spoke w/ patient. He clarifies that he had a couple of twinges of pain last night. No assoc SOB, dizziness, arm or neck pain, etc. He states symptoms resolved relatively quickly. He did have some residual pressure for a few mins last night but no symptoms today. He has already set up an appt to see Dr. Reynaldo Minium today. I advised if symptoms return to go to ER. Advised pt to call if/when he wants to see [place] or he has gotten further recommendations from Dr. Reynaldo Minium - we can set up to see PA or provider as available. Pt voiced understanding of recommendations and stated thanks for the call.

## 2015-12-06 NOTE — H&P (Signed)
Cardiology H&P    Patient ID: Justin Orozco MRN: [id number], DOB/AGE: Aug 24, 1926   Admit date: 12/06/2015 Date of Consult: 12/06/2015  Primary Physician: Geoffery Lyons, MD Primary Cardiologist: Dr. [name]  Patient Profile    82 yo male with PMH of Prostate CA/Arthritis/CHF/Atrial Flutter and CHB s/p PPM who presented to the [facility] ED for brief episode of chest pain.   Past Medical History   Past Medical History  Diagnosis Date  . Atrial flutter ([place])   . BPH (benign prostatic hyperplasia)   . Complete heart block ([place]) 08/2013    s/p STJ dual chamber pacemaker implant  . Paroxysmal atrial flutter ([place])   . CHF (congestive heart failure) ([place])   . Arthritis     "nothing serious" (05/31/2014)  . Prostate cancer [facility])     radiation    Past Surgical History  Procedure Laterality Date  . Total hip arthroplasty Right 08/2010    Dr. French Ana  . Transthoracic echocardiogram  08/2010    EF 55-60%, mod conc hypertrophy, grade 1 diastolic dysfunction, AV sclerosis, mild regurg; RA mildly dilated  . Nm myocar perf wall motion  07/2010    bruce myoview - perfusion defect in basal-apical inferior region (more at rest than stress) - non-gated (A-Flutter), low risk  . Permanent pacemaker insertion N/A 08/21/2013    STJ dual chamber PPM implanted for CHB  . Tonsillectomy  ~ 1935  . Anterior cruciate ligament repair Right ~ 1950  . Hip arthroplasty Left 06/01/2014    Procedure: LEFT HIP HEMI ARTHROPLASTY ;  Surgeon: Renette Butters, MD;  Location: [place];  Service: Orthopedics;  Laterality: Left;  Requesting Justin or April RNFA     Allergies  No Known Allergies  History of Present Illness    82 yo male patient of Dr. [name] and Dr. Rayann Heman with PMH of Prostate CA/Arthritis/CHF/Atrial Flutter and CHB s/p PPM in 2015. He was last seen in the office on 03/2015 by Amber and noted to be in his normal state of health, with no changes made to his PMM. Did note asymptomatic atrial  flutter, with episodes lasting less than 20 minutes. He currently lives at [facility] independent living facility. Last Nuc Stress Test was in 07/2010 with no ischemia. Last Echo 08/2013 showed EF 40-45% and G1DD.   Has been in his normal state of health until yesterday. States he was out walking with his friends and felt a hot sensation, along with some sharp pains at his pacemaker site, with tingling sensation down his left arm. He felt concerned that there may be a problem with the battery. Then reports he was awoken from sleep intermittently a couple of times during the night with centralized chest pressure. This morning he called his son and told he about the episodes of chest pressure, and that he "just didn't feel well". Also, reports some nausea this morning after eating breakfast. His family called the office and attempted to wait for an appt today, but came to the ED for further evaluation.   In the ED his labs showed Cr 1.69, Trop 0.04 x1, Hgb 13.7, and chest x-ray was negative for edema or acute findings. EKG shows AV paced rhythm. The EDP has called for interrogation of his St Jude PPM, but results are pending. He is currently pain free, and no arrhthymias noted on telemetry.    Inpatient Medications      Family History    History reviewed. No pertinent family history.  Social History  Social History   Social History  . Marital Status: Widowed    Spouse Name: N/A  . Number of Children: 2  . Years of Education: N/A   Occupational History  . Not on file.   Social History Main Topics  . Smoking status: Passive Smoke Exposure - Never Smoker  . Smokeless tobacco: Never Used  . Alcohol Use: 2.3 oz/week    3 Glasses of wine, 1 Standard drinks or equivalent per week  . Drug Use: No  . Sexual Activity: No   Other Topics Concern  . Not on file   Social History Narrative     Review of Systems    General:  No chills, fever, night sweats or weight changes.  Cardiovascular:   See HPI Dermatological: No rash, lesions/masses Respiratory: No cough, dyspnea Urologic: No hematuria, dysuria Abdominal:   + nausea, vomiting, diarrhea, bright red blood per rectum, melena, or hematemesis Neurologic:  No visual changes, wkns, changes in mental status. All other systems reviewed and are otherwise negative except as noted above.  Physical Exam    Blood pressure 128/68, pulse 79, temperature 98.3 F (36.8 C), temperature source Oral, resp. rate 20, SpO2 99 %.  General: Pleasant older male, NAD Psych: Normal affect. Neuro: Alert and oriented X 3. Moves all extremities spontaneously. HEENT: Normal  Neck: Supple without bruits or JVD. Lungs:  Resp regular and unlabored, CTA. Heart: RRR no s3, s4, or murmurs. Well healed pacemaker insertion site. Abdomen: Soft, non-tender, non-distended, BS + x 4.  Extremities: No clubbing, cyanosis or edema. DP/PT/Radials 2+ and equal bilaterally.  Labs    Troponin (Point of Care Test) No results for input(s): TROPIPOC in the last 72 hours.  Recent Labs  12/06/15 1135  TROPONINI 0.04*   Lab Results  Component Value Date   WBC 9.5 12/06/2015   HGB 13.7 12/06/2015   HCT 40.9 12/06/2015   MCV 90.3 12/06/2015   PLT 208 12/06/2015    Recent Labs Lab 12/06/15 1135  NA 139  K 4.0  CL 105  CO2 26  BUN 36*  CREATININE 1.69*  CALCIUM 10.1  GLUCOSE 151*     Radiology Studies    Dg Chest 2 View  12/06/2015  CLINICAL DATA:  Left side chest pain beginning last night. EXAM: CHEST  2 VIEW COMPARISON:  05/31/2014 FINDINGS: There is hyperinflation of the lungs compatible with COPD. Left pacer remains in place, unchanged. Heart and mediastinal contours are within normal limits. No focal opacities or effusions. No acute bony abnormality. IMPRESSION: No active cardiopulmonary disease.  COPD. Electronically Signed   By: Rolm Baptise M.D.   On: 12/06/2015 12:34    ECG & Cardiac Imaging    EKG: AV paced   Echo: 09/07/2013  Study  Conclusions  - Left ventricle: The cavity size was normal. Wall thickness was normal. Systolic function was mildly to moderately reduced. The estimated ejection fraction was in the range of 40% to 45%. There is hypokinesis of the apical myocardium. Doppler parameters are consistent with abnormal left ventricular relaxation (grade 1 diastolic dysfunction). - Aortic valve: Mild regurgitation. - Mitral valve: Mild regurgitation. - Right atrium: The atrium was mildly dilated. - Pericardium, extracardiac: A small pericardial effusion was identified. Impressions:  - Compared to 08/21/13, LV function is worse.  Assessment & Plan    82 yo male patient of Dr. [name] and Dr. Rayann Heman with PMH of Prostate CA/Arthritis/systolic and diastolic CHF/Atrial Flutter and CHB s/p PPM in 2015. He was last seen  in the office on 03/2015 by Amber and noted to be in his normal state of health, with no changes made to his PMM. Did note asymptomatic atrial flutter, with episodes lasting less than 20 minutes. He currently lives at [facility] independent living facility. Last Nuc Stress Test was in 07/2010 with no ischemia. Last Echo 08/2013 showed EF 40-45%.  Presented to the ED with reports of brief hot/sharp sensation at his pacemaker site yesterday while walking. Then had several episodes during the night of chest pressure, with some nausea after eating his breakfast this morning. EKG showed AV paced rhythm, Trop 0.04, mildly elevated Cr 1.69.   1. Chest pain: Reports he became concerned when he felt this hot/sharp sensation at his pacemaker site that there may be a problem with the battery life. Interrogation results pending per EDP. Currently pain free, with no arrhythmias noted on telemetry --Cr slightly elevated at 1.69, likely due to reduced PO intake and recent ETOH consumption --Trop 0.04, has not had any recurrent chest pain this morning, or while in the ED, could be related to mild Cr increase?  Will wait on repeat trop levels before starting anticoagulation --Would cycle trop, pending interrogation results --After talking with Dr. Oval Linsey, will plan to admit overnight, Make NPO and reassess whether to Stress or Cath in the am --Repeat 2D echo  2. CHB/A-flutter s/p PPM (St Jude) At his last visit with EP noted to have continued episodes of atrial flutter lasting < 20 minutes. At that time is was noted if he has increased atrial arrthythmia burden we may need to consider anticoagulation --This patients CHA2DS2-VASc Score and unadjusted Ischemic Stroke Rate (% per year) is equal to 3.2 % stroke rate/year from a score of 3 Above score calculated as 1 point each if present [CHF, HTN, DM, Vascular=MI/PAD/Aortic Plaque, Age if 65-74, or Male] Above score calculated as 2 points each if present [Age > 75, or Stroke/TIA/TE]  3. Systolic and diastolic CHF: Last Echo 123XX123 with EF of 40-45%.      Barnet Pall, NP-C Pager [phone] 12/06/2015, 2:00 PM

## 2015-12-06 NOTE — Telephone Encounter (Signed)
Returned call. Gwynn, RN w [facility], states that the [employer] team received a call yesterday evening from patient's son. Security called RN on staff bc pt was having chest pain at the time. The pain resolved, then the patient had return of symptoms this AM. Pt was seen by EMS but refused transport to hospital. Pt is wanting to be seen by provider today. He was already advised by PCP to go to hospital after calling for an appointment there.  Gwynn, RN who called notes pt states pain gone now, but still having "pressure" in chest. I confirmed that the patient should be given advice to go to ED - appropriate to be eval'd there if continued chest discomfort. She voiced understanding of recommendations and will communicate this to the patient/family.

## 2015-12-07 ENCOUNTER — Observation Stay (HOSPITAL_BASED_OUTPATIENT_CLINIC_OR_DEPARTMENT_OTHER): Payer: Medicare Other

## 2015-12-07 DIAGNOSIS — I499 Cardiac arrhythmia, unspecified: Secondary | ICD-10-CM

## 2015-12-07 DIAGNOSIS — Z7722 Contact with and (suspected) exposure to environmental tobacco smoke (acute) (chronic): Secondary | ICD-10-CM | POA: Diagnosis not present

## 2015-12-07 DIAGNOSIS — Z8546 Personal history of malignant neoplasm of prostate: Secondary | ICD-10-CM | POA: Diagnosis not present

## 2015-12-07 DIAGNOSIS — R079 Chest pain, unspecified: Secondary | ICD-10-CM | POA: Diagnosis not present

## 2015-12-07 DIAGNOSIS — R600 Localized edema: Secondary | ICD-10-CM

## 2015-12-07 DIAGNOSIS — I4892 Unspecified atrial flutter: Secondary | ICD-10-CM | POA: Diagnosis not present

## 2015-12-07 DIAGNOSIS — I509 Heart failure, unspecified: Secondary | ICD-10-CM | POA: Diagnosis not present

## 2015-12-07 DIAGNOSIS — I5042 Chronic combined systolic (congestive) and diastolic (congestive) heart failure: Secondary | ICD-10-CM

## 2015-12-07 DIAGNOSIS — Z7982 Long term (current) use of aspirin: Secondary | ICD-10-CM | POA: Diagnosis not present

## 2015-12-07 DIAGNOSIS — N179 Acute kidney failure, unspecified: Secondary | ICD-10-CM | POA: Diagnosis not present

## 2015-12-07 DIAGNOSIS — I442 Atrioventricular block, complete: Secondary | ICD-10-CM | POA: Diagnosis not present

## 2015-12-07 DIAGNOSIS — Z96643 Presence of artificial hip joint, bilateral: Secondary | ICD-10-CM | POA: Diagnosis not present

## 2015-12-07 LAB — ECHOCARDIOGRAM COMPLETE
CHL CUP DOP CALC LVOT VTI: 15.9 cm
CHL CUP MV DEC (S): 394
CHL CUP TV REG PEAK VELOCITY: 238 cm/s
E decel time: 394 msec
EERAT: 11.05
FS: 27 % — AB (ref 28–44)
Height: 70 in
IV/PV OW: 1.14
LA diam end sys: 30 mm
LA diam index: 1.8 cm/m2
LASIZE: 30 mm
LDCA: 3.8 cm2
LV E/e' medial: 11.05
LV E/e'average: 11.05
LV TDI E'LATERAL: 5.22
LV e' LATERAL: 5.22 cm/s
LVOT SV: 60 mL
LVOT peak grad rest: 3 mmHg
LVOT peak vel: 85.7 cm/s
LVOTD: 22 mm
MV pk E vel: 57.7 m/s
MVPKAVEL: 88.8 m/s
PW: 11.1 mm — AB (ref 0.6–1.1)
RV sys press: 31 mmHg
TDI e' medial: 7.51
TRMAXVEL: 238 cm/s
Weight: 2012.8 oz

## 2015-12-07 LAB — LIPID PANEL
CHOL/HDL RATIO: 3 ratio
Cholesterol: 125 mg/dL (ref 0–200)
HDL: 42 mg/dL (ref >40)
LDL CALC: 75 mg/dL (ref 0–99)
TRIGLYCERIDES: 38 mg/dL (ref <150)
VLDL: 8 mg/dL (ref 0–40)

## 2015-12-07 LAB — BASIC METABOLIC PANEL
Anion gap: 8 (ref 5–15)
BUN: 33 mg/dL — AB (ref 6–20)
CALCIUM: 8.9 mg/dL (ref 8.9–10.3)
CO2: 24 mmol/L (ref 22–32)
CREATININE: 1.65 mg/dL — AB (ref 0.61–1.24)
Chloride: 107 mmol/L (ref 101–111)
GFR calc non Af Amer: 36 mL/min — ABNORMAL LOW (ref >60)
GFR, EST AFRICAN AMERICAN: 41 mL/min — AB (ref >60)
Glucose, Bld: 94 mg/dL (ref 65–99)
Potassium: 3.5 mmol/L (ref 3.5–5.1)
SODIUM: 139 mmol/L (ref 135–145)

## 2015-12-07 LAB — HEMOGLOBIN A1C
Hgb A1c MFr Bld: 5.6 % (ref 4.8–5.6)
Mean Plasma Glucose: 114 mg/dL

## 2015-12-07 LAB — TROPONIN I
TROPONIN I: 0.03 ng/mL — AB (ref <0.03)
TROPONIN I: 0.03 ng/mL — AB (ref <0.03)

## 2015-12-07 MED ORDER — NITROGLYCERIN 0.4 MG SL SUBL: 0.4000 mg | SUBLINGUAL_TABLET | SUBLINGUAL | Status: DC | PRN

## 2015-12-07 MED ORDER — PERFLUTREN LIPID MICROSPHERE
1.0000 mL | INTRAVENOUS | Status: AC | PRN
  Administered 2015-12-07: 2 mL via INTRAVENOUS

## 2015-12-07 NOTE — Care Management Obs Status (Signed)
[place] NOTIFICATION   Patient Details  Name: SHAILEN UGALDE MRN: [id number] Date of Birth: 1926-09-06   Medicare Observation Status Notification Given:  Yes    CrutchfieldAntony Haste, RN 12/07/2015, 1:31 PM

## 2015-12-07 NOTE — Progress Notes (Signed)
Patient Name: Justin Orozco Date of Encounter: 12/07/2015  Hospital Problem List     Active Problems:   Complete heart block [facility])   Lower extremity edema   Paroxysmal atrial flutter (HCC)   Pacemaker   Chronic combined systolic and diastolic congestive heart failure (HCC)   Acute on chronic renal failure (HCC)   Chest pain    Subjective   Feeling well this morning. No episodes of chest pain or left arm pain. Smiling reading the paper.  Inpatient Medications    . aspirin  324 mg Oral NOW   Or  . aspirin  300 mg Rectal NOW  . aspirin EC  81 mg Oral Daily  . heparin  5,000 Units Subcutaneous Q8H  . hydrALAZINE  10 mg Oral BID  . hydrochlorothiazide  12.5 mg Oral Daily  . mirabegron ER  25 mg Oral QHS    Vital Signs    Filed Vitals:   12/06/15 1706 12/06/15 2000 12/06/15 2152 12/07/15 0543  BP: 154/63 131/69 131/69 114/52  Pulse: 60   59  Temp: 98.2 F (36.8 C) 98.4 F (36.9 C)  98.4 F (36.9 C)  TempSrc: Oral Oral  Oral  Resp: 17 18  16   Height: 5\' 10"  (1.778 m)     Weight: 125 lb 12.8 oz (57.063 kg)   125 lb 12.8 oz (57.063 kg)  SpO2: 100% 99%  98%   No intake or output data in the 24 hours ending 12/07/15 0954 Filed Weights   12/06/15 1706 12/07/15 0543  Weight: 125 lb 12.8 oz (57.063 kg) 125 lb 12.8 oz (57.063 kg)    Physical Exam    General: Pleasant older male, NAD. Neuro: Alert and oriented X 3. Moves all extremities spontaneously. Psych: Normal affect. HEENT:  Normal  Neck: Supple without bruits or JVD. Lungs:  Resp regular and unlabored, CTA. Heart: RRR no s3, s4, or murmurs. Well healed pacemaker insertion site. Abdomen: Soft, non-tender, non-distended, BS + x 4.  Extremities: No clubbing, cyanosis or edema. DP/PT/Radials 2+ and equal bilaterally.  Labs    CBC  Recent Labs  12/06/15 1135  WBC 9.5  NEUTROABS 7.0  HGB 13.7  HCT 40.9  MCV 90.3  PLT 123XX123   Basic Metabolic Panel  Recent Labs  12/06/15 1135 12/07/15 0450  NA  139 139  K 4.0 3.5  CL 105 107  CO2 26 24  GLUCOSE 151* 94  BUN 36* 33*  CREATININE 1.69* 1.65*  CALCIUM 10.1 8.9   Cardiac Enzymes  Recent Labs  12/06/15 1900 12/06/15 2305 12/07/15 0450  TROPONINI 0.03* 0.03* 0.03*   Hemoglobin A1C  Recent Labs  12/06/15 1900  HGBA1C 5.6   Fasting Lipid Panel  Recent Labs  12/07/15 0450  CHOL 125  HDL 42  LDLCALC 75  TRIG 38  CHOLHDL 3.0     Telemetry    AV paced Rate-60s PVCs  ECG    AV paced with PVCs  Radiology     Assessment & Plan    82 yo male patient of Dr. [name] and Dr. Rayann Heman with PMH of Prostate CA/Arthritis/systolic and diastolic CHF/Atrial Flutter and CHB s/p PPM in 2015. He was last seen in the office on 03/2015 by Amber and noted to be in his normal state of health, with no changes made to his PMM. Did note asymptomatic atrial flutter, with episodes lasting less than 20 minutes. He currently lives at [facility] independent living facility. Last Nuc Stress Test was in 07/2010  with no ischemia. Last Echo 08/2013 showed EF 40-45%.  Presented to the ED with reports of brief hot/sharp sensation at his pacemaker site yesterday while walking. Then had several episodes during the night of chest pressure, with some nausea after eating his breakfast this morning. EKG showed AV paced rhythm, Trop 0.04, mildly elevated Cr 1.69. Admitted overnight to our service  1. Chest pain: No recurrent episodes of chest pain or left arm pain since admission. Initial trop 0.04>>0.03 x3 --AV paced rhythm on telemetry --Stable this morning, waiting on 2D echo results, if stable will have him follow up outpatient to decide on stress test. --Cr remains slightly elevated this morning, 1.69>>1.65 was given 500cc NS over 5 hours yesterday. Cr was 1.3 in 05/2014  2. CHB/A-flutter s/p PPM (St Jude) At his last visit with EP noted to have continued episodes of atrial flutter lasting < 20 minutes. At that time is was noted if he has increased  atrial arrthythmia burden we may need to consider anticoagulation --This patients CHA2DS2-VASc Score and unadjusted Ischemic Stroke Rate (% per year) is equal to 3.2 % stroke rate/year from a score of 3 Above score calculated as 1 point each if present [CHF, HTN, DM, Vascular=MI/PAD/Aortic Plaque, Age if 65-74, or Male] Above score calculated as 2 points each if present [Age > 75, or Stroke/TIA/TE] -- Interrogated in the ED, but results not found on the chart, discussed with St. Jude rep. Paper results to be printed and brought to the floor.   3. Systolic and diastolic CHF: Last Echo 123XX123 with EF of 40-45%.  --Repeat Echo pending   Signed, Reino Bellis NP-C Pager [phone]

## 2015-12-07 NOTE — Progress Notes (Signed)
  Echocardiogram 2D Echocardiogram with Definity has been performed.  Darlina Sicilian M 12/07/2015, 3:36 PM

## 2015-12-07 NOTE — Care Management Note (Addendum)
Case Management Note  Patient Details  Name: Justin Orozco MRN: [id number] Date of Birth: [birth year]  Subjective/Objective:  82 y.o. M admitted 12/06/2015 with Chronic HF who has completed Stress Test.                   Action/Plan: Anticipate discharge back to [facility]  Today if results of above tests are negative.  No further CM needs but will be available should additional discharge needs arise.   Expected Discharge Date:                  Expected Discharge Plan:     In-House Referral:     Discharge planning Services  CM Consult  Post Acute Care Choice:    Choice offered to:     DME Arranged:    DME Agency:     HH Arranged:    [place] Agency:     Status of Service:  Completed, signed off  If discussed at [employer] of Stay Meetings, dates discussed:    Additional Comments:  Delrae Sawyers, RN 12/07/2015, 1:31 PM

## 2015-12-07 NOTE — Discharge Summary (Signed)
Discharge Summary    Patient ID: Justin Orozco,  MRN: [id number], DOB/AGE: 1926/07/04 82 y.o.  Admit date: 12/06/2015 Discharge date: 12/07/2015  Primary Care Provider: [name],[name] Primary Cardiologist: Dr. [name]  Discharge Diagnoses    Active Problems:   Complete heart block [facility])   Lower extremity edema   Paroxysmal atrial flutter [facility])   Pacemaker   Chronic combined systolic and diastolic congestive heart failure (HCC)   Acute on chronic renal failure (HCC)   Chest pain   PMT (pacemaker-mediated tachycardia)   Allergies No Known Allergies  Diagnostic Studies/Procedures    2D echo: 12/07/2015  Study Conclusions  - Left ventricle: The cavity size was normal. There was mild focal  basal hypertrophy of the septum. Systolic function was normal.  The estimated ejection fraction was in the range of 50% to 55%.  Wall motion was normal; there were no regional wall motion  abnormalities. Doppler parameters are consistent with abnormal  left ventricular relaxation (grade 1 diastolic dysfunction).  There was no evidence of elevated ventricular filling pressure by  Doppler parameters. - Aortic valve: Trileaflet; mildly thickened, mildly calcified  leaflets. There was mild regurgitation. - Aortic root: The aortic root was normal in size. - Mitral valve: Structurally normal valve. There was mild  regurgitation. - Left atrium: The atrium was normal in size. - Right ventricle: Pacer wire or catheter noted in right ventricle.  Systolic function was normal. - Right atrium: Pacer wire or catheter noted in right atrium. - Tricuspid valve: There was mild regurgitation. - Pulmonary arteries: PA peak pressure: 31 mm Hg (S). - Inferior vena cava: The vessel was normal in size. - Pericardium, extracardiac: There was no pericardial effusion. _____________   History of Present Illness     82 yo male patient of Dr. [name] and Dr. Rayann Heman with PMH of Prostate  CA/Arthritis/CHF/Atrial Flutter and CHB s/p PPM in 2015. He was last seen in the office on 03/2015 by Amber and noted to be in his normal state of health, with no changes made to his PMM. Did note asymptomatic atrial flutter, with episodes lasting less than 20 minutes. He currently lives at [facility] independent living facility. Last Nuc Stress Test was in 07/2010 with no ischemia. Last Echo 08/2013 showed EF 40-45% and G1DD.   Has been in his normal state of health until 12/06/2015. States he was out walking with his friends and felt a hot sensation, along with some sharp pains at his pacemaker site, with tingling sensation down his left arm. He felt concerned that there may be a problem with the battery. Then reports he was awoken from sleep intermittently a couple of times during the night with centralized chest pressure. The next morning he called his son and told him about the episodes of chest pressure, and that he "just didn't feel well". Also, reports some nausea this morning after eating breakfast. His family called the office and attempted to wait for an appt today, but came to the ED for further evaluation.   In the ED his labs showed Cr 1.69, Trop 0.04 x1, Hgb 13.7, and chest x-ray was negative for edema or acute findings. EKG shows AV paced rhythm. The EDP has called for interrogation of his St [employer] PPM. He is currently pain free, and no arrhthymias noted on telemetry.    Hospital Course    He was admitted overnight to our service. His trop were cycled and neg x3. Given 500cc IV fluids for his mild  elevate Cr level of 1.69 with some improvement to 1.65. He remains without chest pain during the admission. His pacemaker interrogation showed an episode of PMT that correlated with the time that he reports having felt the sensation around his pacemaker site. St [employer] was called and adjustments made accordingly. Also briefly consulted with EP who agreed with the plan and adjustments made. Repeat 2D  echo was ordered given his last was in 2015. 12/07/2015 2D echo showed an improved EF of 50-55% with NRWMA.   He was seen and assessed by Dr. Oval Linsey on 12/07/2015 and determined stable for discharge. Vital signs and morning labs were stable.I have arranged for outpatient follow up in the office. Will defer to outpatient management whether to proceed with further cardiac work up. _____________  Discharge Vitals Blood pressure 114/52, pulse 59, temperature 98.4 F (36.9 C), temperature source Oral, resp. rate 16, height 5\' 10"  (1.778 m), weight 125 lb 12.8 oz (57.063 kg), SpO2 98 %.  Filed Weights   12/06/15 1706 12/07/15 0543  Weight: 125 lb 12.8 oz (57.063 kg) 125 lb 12.8 oz (57.063 kg)    Labs & Radiologic Studies    CBC  Recent Labs  12/06/15 1135  WBC 9.5  NEUTROABS 7.0  HGB 13.7  HCT 40.9  MCV 90.3  PLT 123XX123   Basic Metabolic Panel  Recent Labs  12/06/15 1135 12/07/15 0450  NA 139 139  K 4.0 3.5  CL 105 107  CO2 26 24  GLUCOSE 151* 94  BUN 36* 33*  CREATININE 1.69* 1.65*  CALCIUM 10.1 8.9   Liver Function Tests No results for input(s): AST, ALT, ALKPHOS, BILITOT, PROT, ALBUMIN in the last 72 hours. No results for input(s): LIPASE, AMYLASE in the last 72 hours. Cardiac Enzymes  Recent Labs  12/06/15 1900 12/06/15 2305 12/07/15 0450  TROPONINI 0.03* 0.03* 0.03*   BNP Invalid input(s): POCBNP D-Dimer No results for input(s): DDIMER in the last 72 hours. Hemoglobin A1C  Recent Labs  12/06/15 1900  HGBA1C 5.6   Fasting Lipid Panel  Recent Labs  12/07/15 0450  CHOL 125  HDL 42  LDLCALC 75  TRIG 38  CHOLHDL 3.0   Thyroid Function Tests No results for input(s): TSH, T4TOTAL, T3FREE, THYROIDAB in the last 72 hours.  Invalid input(s): FREET3 _____________  Dg Chest 2 View  12/06/2015  CLINICAL DATA:  Left side chest pain beginning last night. EXAM: CHEST  2 VIEW COMPARISON:  05/31/2014 FINDINGS: There is hyperinflation of the lungs compatible  with COPD. Left pacer remains in place, unchanged. Heart and mediastinal contours are within normal limits. No focal opacities or effusions. No acute bony abnormality. IMPRESSION: No active cardiopulmonary disease.  COPD. Electronically Signed   By: Rolm Baptise M.D.   On: 12/06/2015 12:34   Disposition   Pt is being discharged home today in good condition.  Follow-up Plans & Appointments    Follow-up Information    Follow up with Ermalinda Barrios, PA-C On 12/22/2015.   Specialty:  Cardiology   Why:  9:15am for your hopital follow up   Contact information:   [address] [place] [address] [phone]      Discharge Instructions    Call MD for:  extreme fatigue    Complete by:  As directed      Call MD for:  persistant dizziness or light-headedness    Complete by:  As directed      Call MD for:  persistant nausea and vomiting  Complete by:  As directed      Call MD for:  severe uncontrolled pain    Complete by:  As directed      Call MD for:  temperature >100.4    Complete by:  As directed      Diet - low sodium heart healthy    Complete by:  As directed      Increase activity slowly    Complete by:  As directed            Discharge Medications   Current Discharge Medication List    START taking these medications   Details  nitroGLYCERIN (NITROSTAT) 0.4 MG SL tablet Place 1 tablet (0.4 mg total) under the tongue every 5 (five) minutes x 3 doses as needed for chest pain. Qty: 25 tablet, Refills: 2      CONTINUE these medications which have NOT CHANGED   Details  aspirin 81 MG tablet Take 81 mg by mouth daily.    hydrALAZINE (APRESOLINE) 10 MG tablet Take 10 mg by mouth 2 (two) times daily.    hydrochlorothiazide (MICROZIDE) 12.5 MG capsule TAKE 1 CAPSULE BY MOUTH DAILY Qty: 90 capsule, Refills: 3    mirabegron ER (MYRBETRIQ) 25 MG TB24 tablet Take 25 mg by mouth at bedtime.    potassium chloride (K-DUR,KLOR-CON) 10 MEQ tablet TAKE 1 TABLET  BY MOUTH EVERY DAY Qty: 30 tablet, Refills: 11           Outstanding Labs/Studies   None  Duration of Discharge Encounter   Greater than 30 minutes including physician time.  Signed, Reino Bellis NP-C 12/07/2015, 6:25 PM

## 2015-12-08 ENCOUNTER — Encounter: Payer: Self-pay | Admitting: Cardiology

## 2015-12-08 NOTE — Progress Notes (Signed)
Remote pacemaker transmission.   

## 2015-12-09 LAB — CUP PACEART REMOTE DEVICE CHECK
Battery Remaining Longevity: 121 mo
Battery Remaining Percentage: 95 %
Brady Statistic RA Percent Paced: 29 %
Date Time Interrogation Session: 20170630114427
Implantable Lead Implant Date: 20150313
Implantable Lead Model: 1944
Lead Channel Setting Pacing Amplitude: 0.75 V
Lead Channel Setting Pacing Amplitude: 2 V
Lead Channel Setting Sensing Sensitivity: 5 mV
MDC IDC LEAD IMPLANT DT: 20150313
MDC IDC LEAD LOCATION: 753859
MDC IDC LEAD LOCATION: 753860
MDC IDC MSMT BATTERY VOLTAGE: 3.01 V
MDC IDC MSMT LEADCHNL RA IMPEDANCE VALUE: 480 Ohm
MDC IDC MSMT LEADCHNL RA SENSING INTR AMPL: 5 mV
MDC IDC MSMT LEADCHNL RV IMPEDANCE VALUE: 580 Ohm
MDC IDC MSMT LEADCHNL RV PACING THRESHOLD AMPLITUDE: 0.5 V
MDC IDC MSMT LEADCHNL RV PACING THRESHOLD PULSEWIDTH: 0.5 ms
MDC IDC MSMT LEADCHNL RV SENSING INTR AMPL: 6.8 mV
MDC IDC PG SERIAL: 7596067
MDC IDC SET LEADCHNL RV PACING PULSEWIDTH: 0.5 ms
MDC IDC STAT BRADY RV PERCENT PACED: 99 % — AB
Pulse Gen Model: 2240

## 2015-12-22 ENCOUNTER — Encounter: Payer: Medicare Other | Admitting: Physician Assistant

## 2016-01-18 DIAGNOSIS — I129 Hypertensive chronic kidney disease with stage 1 through stage 4 chronic kidney disease, or unspecified chronic kidney disease: Secondary | ICD-10-CM | POA: Diagnosis not present

## 2016-01-18 DIAGNOSIS — M1990 Unspecified osteoarthritis, unspecified site: Secondary | ICD-10-CM | POA: Diagnosis not present

## 2016-01-18 DIAGNOSIS — M810 Age-related osteoporosis without current pathological fracture: Secondary | ICD-10-CM | POA: Diagnosis not present

## 2016-01-18 DIAGNOSIS — R600 Localized edema: Secondary | ICD-10-CM | POA: Diagnosis not present

## 2016-01-18 DIAGNOSIS — N183 Chronic kidney disease, stage 3 (moderate): Secondary | ICD-10-CM | POA: Diagnosis not present

## 2016-01-18 DIAGNOSIS — I4892 Unspecified atrial flutter: Secondary | ICD-10-CM | POA: Diagnosis not present

## 2016-01-18 DIAGNOSIS — Z950 Presence of cardiac pacemaker: Secondary | ICD-10-CM | POA: Diagnosis not present

## 2016-01-18 DIAGNOSIS — Z8546 Personal history of malignant neoplasm of prostate: Secondary | ICD-10-CM | POA: Diagnosis not present

## 2016-01-18 DIAGNOSIS — I10 Essential (primary) hypertension: Secondary | ICD-10-CM | POA: Diagnosis not present

## 2016-01-18 DIAGNOSIS — E784 Other hyperlipidemia: Secondary | ICD-10-CM | POA: Diagnosis not present

## 2016-01-18 DIAGNOSIS — I459 Conduction disorder, unspecified: Secondary | ICD-10-CM | POA: Diagnosis not present

## 2016-01-18 DIAGNOSIS — I700 Atherosclerosis of aorta: Secondary | ICD-10-CM | POA: Diagnosis not present

## 2016-03-08 ENCOUNTER — Encounter: Payer: Medicare Other | Admitting: *Deleted

## 2016-03-08 ENCOUNTER — Telehealth: Payer: Self-pay | Admitting: Cardiology

## 2016-03-08 DIAGNOSIS — Z23 Encounter for immunization: Secondary | ICD-10-CM | POA: Diagnosis not present

## 2016-03-08 NOTE — Telephone Encounter (Signed)
Attempted to confirm remote transmission with pt. No answer and was unable to leave a message.   

## 2016-03-09 ENCOUNTER — Encounter: Payer: Self-pay | Admitting: Cardiology

## 2016-05-10 DIAGNOSIS — I4892 Unspecified atrial flutter: Secondary | ICD-10-CM | POA: Diagnosis not present

## 2016-05-10 DIAGNOSIS — Z8546 Personal history of malignant neoplasm of prostate: Secondary | ICD-10-CM | POA: Diagnosis not present

## 2016-05-10 DIAGNOSIS — I129 Hypertensive chronic kidney disease with stage 1 through stage 4 chronic kidney disease, or unspecified chronic kidney disease: Secondary | ICD-10-CM | POA: Diagnosis not present

## 2016-05-10 DIAGNOSIS — M810 Age-related osteoporosis without current pathological fracture: Secondary | ICD-10-CM | POA: Diagnosis not present

## 2016-05-10 DIAGNOSIS — R413 Other amnesia: Secondary | ICD-10-CM | POA: Diagnosis not present

## 2016-05-10 DIAGNOSIS — I700 Atherosclerosis of aorta: Secondary | ICD-10-CM | POA: Diagnosis not present

## 2016-05-10 DIAGNOSIS — M1990 Unspecified osteoarthritis, unspecified site: Secondary | ICD-10-CM | POA: Diagnosis not present

## 2016-05-10 DIAGNOSIS — N183 Chronic kidney disease, stage 3 (moderate): Secondary | ICD-10-CM | POA: Diagnosis not present

## 2016-05-10 DIAGNOSIS — E784 Other hyperlipidemia: Secondary | ICD-10-CM | POA: Diagnosis not present

## 2016-05-10 DIAGNOSIS — Z6821 Body mass index (BMI) 21.0-21.9, adult: Secondary | ICD-10-CM | POA: Diagnosis not present

## 2016-05-10 DIAGNOSIS — Z950 Presence of cardiac pacemaker: Secondary | ICD-10-CM | POA: Diagnosis not present

## 2016-05-10 DIAGNOSIS — I10 Essential (primary) hypertension: Secondary | ICD-10-CM | POA: Diagnosis not present

## 2016-07-05 DIAGNOSIS — R413 Other amnesia: Secondary | ICD-10-CM | POA: Diagnosis not present

## 2016-07-05 DIAGNOSIS — I10 Essential (primary) hypertension: Secondary | ICD-10-CM | POA: Diagnosis not present

## 2016-07-05 DIAGNOSIS — M1990 Unspecified osteoarthritis, unspecified site: Secondary | ICD-10-CM | POA: Diagnosis not present

## 2016-07-14 ENCOUNTER — Other Ambulatory Visit: Payer: Self-pay | Admitting: Cardiovascular Disease

## 2016-07-16 ENCOUNTER — Other Ambulatory Visit: Payer: Self-pay | Admitting: Cardiovascular Disease

## 2016-07-16 NOTE — Telephone Encounter (Signed)
Rx(s) sent to pharmacy electronically.  

## 2016-08-02 DIAGNOSIS — I700 Atherosclerosis of aorta: Secondary | ICD-10-CM | POA: Diagnosis not present

## 2016-08-02 DIAGNOSIS — Z8546 Personal history of malignant neoplasm of prostate: Secondary | ICD-10-CM | POA: Diagnosis not present

## 2016-08-02 DIAGNOSIS — R8299 Other abnormal findings in urine: Secondary | ICD-10-CM | POA: Diagnosis not present

## 2016-08-02 DIAGNOSIS — R413 Other amnesia: Secondary | ICD-10-CM | POA: Diagnosis not present

## 2016-08-02 DIAGNOSIS — Z6820 Body mass index (BMI) 20.0-20.9, adult: Secondary | ICD-10-CM | POA: Diagnosis not present

## 2016-08-02 DIAGNOSIS — I129 Hypertensive chronic kidney disease with stage 1 through stage 4 chronic kidney disease, or unspecified chronic kidney disease: Secondary | ICD-10-CM | POA: Diagnosis not present

## 2016-08-02 DIAGNOSIS — I10 Essential (primary) hypertension: Secondary | ICD-10-CM | POA: Diagnosis not present

## 2016-08-02 DIAGNOSIS — Z1389 Encounter for screening for other disorder: Secondary | ICD-10-CM | POA: Diagnosis not present

## 2016-08-02 DIAGNOSIS — Z0000 Encounter for general adult medical examination without abnormal findings: Secondary | ICD-10-CM | POA: Diagnosis not present

## 2016-08-02 DIAGNOSIS — N183 Chronic kidney disease, stage 3 (moderate): Secondary | ICD-10-CM | POA: Diagnosis not present

## 2016-08-02 DIAGNOSIS — E784 Other hyperlipidemia: Secondary | ICD-10-CM | POA: Diagnosis not present

## 2016-08-02 DIAGNOSIS — Z950 Presence of cardiac pacemaker: Secondary | ICD-10-CM | POA: Diagnosis not present

## 2016-08-04 ENCOUNTER — Other Ambulatory Visit: Payer: Self-pay | Admitting: Cardiovascular Disease

## 2016-08-06 ENCOUNTER — Other Ambulatory Visit: Payer: Self-pay | Admitting: Cardiovascular Disease

## 2016-08-07 NOTE — Telephone Encounter (Signed)
Rx(s) sent to pharmacy electronically.  

## 2016-09-03 ENCOUNTER — Other Ambulatory Visit: Payer: Self-pay | Admitting: Cardiovascular Disease

## 2016-09-04 ENCOUNTER — Telehealth: Payer: Self-pay | Admitting: Nurse Practitioner

## 2016-09-04 NOTE — Telephone Encounter (Signed)
called pt to schedule ast due pacer check, pt mailbox is full

## 2016-09-04 NOTE — Telephone Encounter (Signed)
REFILL 

## 2016-09-10 DIAGNOSIS — Z6820 Body mass index (BMI) 20.0-20.9, adult: Secondary | ICD-10-CM | POA: Diagnosis not present

## 2016-09-10 DIAGNOSIS — M1990 Unspecified osteoarthritis, unspecified site: Secondary | ICD-10-CM | POA: Diagnosis not present

## 2016-10-02 DIAGNOSIS — M545 Low back pain: Secondary | ICD-10-CM | POA: Diagnosis not present

## 2016-10-04 ENCOUNTER — Other Ambulatory Visit: Payer: Self-pay | Admitting: Cardiovascular Disease

## 2016-10-11 ENCOUNTER — Other Ambulatory Visit: Payer: Self-pay | Admitting: Cardiovascular Disease

## 2016-10-16 ENCOUNTER — Other Ambulatory Visit (HOSPITAL_COMMUNITY): Payer: Self-pay | Admitting: Internal Medicine

## 2016-10-16 DIAGNOSIS — IMO0001 Reserved for inherently not codable concepts without codable children: Secondary | ICD-10-CM

## 2016-10-16 DIAGNOSIS — M4850XS Collapsed vertebra, not elsewhere classified, site unspecified, sequela of fracture: Principal | ICD-10-CM

## 2016-10-18 ENCOUNTER — Ambulatory Visit (HOSPITAL_COMMUNITY): Payer: Medicare Other

## 2016-10-26 ENCOUNTER — Encounter (HOSPITAL_COMMUNITY)
Admission: RE | Admit: 2016-10-26 | Discharge: 2016-10-26 | Disposition: A | Discharge status: Home / Self Care | Payer: Medicare Other | Source: Ambulatory Visit | Attending: Internal Medicine | Admitting: Internal Medicine

## 2016-10-26 ENCOUNTER — Ambulatory Visit (HOSPITAL_COMMUNITY)
Admission: RE | Admit: 2016-10-26 | Discharge: 2016-10-26 | Disposition: A | Discharge status: Home / Self Care | Payer: Medicare Other | Source: Ambulatory Visit | Attending: Internal Medicine | Admitting: Internal Medicine

## 2016-10-26 DIAGNOSIS — Z950 Presence of cardiac pacemaker: Secondary | ICD-10-CM | POA: Diagnosis not present

## 2016-10-26 DIAGNOSIS — S22019A Unspecified fracture of first thoracic vertebra, initial encounter for closed fracture: Secondary | ICD-10-CM | POA: Diagnosis not present

## 2016-10-26 DIAGNOSIS — M5136 Other intervertebral disc degeneration, lumbar region: Secondary | ICD-10-CM | POA: Insufficient documentation

## 2016-10-26 DIAGNOSIS — M48061 Spinal stenosis, lumbar region without neurogenic claudication: Secondary | ICD-10-CM | POA: Insufficient documentation

## 2016-10-26 DIAGNOSIS — M4850XS Collapsed vertebra, not elsewhere classified, site unspecified, sequela of fracture: Secondary | ICD-10-CM

## 2016-10-26 DIAGNOSIS — I700 Atherosclerosis of aorta: Secondary | ICD-10-CM | POA: Diagnosis not present

## 2016-10-26 DIAGNOSIS — M5134 Other intervertebral disc degeneration, thoracic region: Secondary | ICD-10-CM | POA: Insufficient documentation

## 2016-10-26 DIAGNOSIS — M4854XA Collapsed vertebra, not elsewhere classified, thoracic region, initial encounter for fracture: Secondary | ICD-10-CM | POA: Diagnosis not present

## 2016-10-26 DIAGNOSIS — IMO0001 Reserved for inherently not codable concepts without codable children: Secondary | ICD-10-CM

## 2016-10-26 DIAGNOSIS — M4856XA Collapsed vertebra, not elsewhere classified, lumbar region, initial encounter for fracture: Secondary | ICD-10-CM | POA: Insufficient documentation

## 2016-10-26 DIAGNOSIS — M8580 Other specified disorders of bone density and structure, unspecified site: Secondary | ICD-10-CM | POA: Insufficient documentation

## 2016-10-26 DIAGNOSIS — S32009A Unspecified fracture of unspecified lumbar vertebra, initial encounter for closed fracture: Secondary | ICD-10-CM | POA: Diagnosis not present

## 2016-10-26 MED ORDER — TECHNETIUM TC 99M MEDRONATE IV KIT
20.5000 | PACK | Freq: Once | INTRAVENOUS | Status: AC | PRN
  Administered 2016-10-26: 20.5 via INTRAVENOUS

## 2016-11-09 ENCOUNTER — Other Ambulatory Visit: Payer: Self-pay | Admitting: Internal Medicine

## 2016-11-09 DIAGNOSIS — M549 Dorsalgia, unspecified: Secondary | ICD-10-CM

## 2016-11-17 ENCOUNTER — Other Ambulatory Visit: Payer: Self-pay | Admitting: Cardiovascular Disease

## 2016-11-19 NOTE — Telephone Encounter (Signed)
Rx(s) sent to pharmacy electronically.  

## 2016-12-19 ENCOUNTER — Other Ambulatory Visit: Payer: Self-pay | Admitting: Cardiovascular Disease

## 2017-01-02 ENCOUNTER — Other Ambulatory Visit: Payer: Self-pay | Admitting: Cardiovascular Disease

## 2017-01-16 DIAGNOSIS — R413 Other amnesia: Secondary | ICD-10-CM | POA: Diagnosis not present

## 2017-01-16 DIAGNOSIS — I4892 Unspecified atrial flutter: Secondary | ICD-10-CM | POA: Diagnosis not present

## 2017-01-16 DIAGNOSIS — N183 Chronic kidney disease, stage 3 (moderate): Secondary | ICD-10-CM | POA: Diagnosis not present

## 2017-01-16 DIAGNOSIS — E784 Other hyperlipidemia: Secondary | ICD-10-CM | POA: Diagnosis not present

## 2017-01-16 DIAGNOSIS — I700 Atherosclerosis of aorta: Secondary | ICD-10-CM | POA: Diagnosis not present

## 2017-01-16 DIAGNOSIS — Z8546 Personal history of malignant neoplasm of prostate: Secondary | ICD-10-CM | POA: Diagnosis not present

## 2017-01-16 DIAGNOSIS — M810 Age-related osteoporosis without current pathological fracture: Secondary | ICD-10-CM | POA: Diagnosis not present

## 2017-01-16 DIAGNOSIS — Z681 Body mass index (BMI) 19 or less, adult: Secondary | ICD-10-CM | POA: Diagnosis not present

## 2017-01-16 DIAGNOSIS — Z950 Presence of cardiac pacemaker: Secondary | ICD-10-CM | POA: Diagnosis not present

## 2017-01-16 DIAGNOSIS — I129 Hypertensive chronic kidney disease with stage 1 through stage 4 chronic kidney disease, or unspecified chronic kidney disease: Secondary | ICD-10-CM | POA: Diagnosis not present

## 2017-01-16 DIAGNOSIS — M1990 Unspecified osteoarthritis, unspecified site: Secondary | ICD-10-CM | POA: Diagnosis not present

## 2017-01-16 DIAGNOSIS — I10 Essential (primary) hypertension: Secondary | ICD-10-CM | POA: Diagnosis not present

## 2017-01-31 ENCOUNTER — Other Ambulatory Visit: Payer: Self-pay | Admitting: Cardiovascular Disease

## 2017-03-03 ENCOUNTER — Other Ambulatory Visit: Payer: Self-pay | Admitting: Cardiovascular Disease

## 2017-03-04 NOTE — Telephone Encounter (Signed)
Rx has been sent to the pharmacy electronically. ° °

## 2017-04-06 ENCOUNTER — Other Ambulatory Visit: Payer: Self-pay | Admitting: Cardiovascular Disease

## 2017-05-06 ENCOUNTER — Other Ambulatory Visit: Payer: Self-pay | Admitting: Cardiovascular Disease

## 2017-06-17 DIAGNOSIS — R278 Other lack of coordination: Secondary | ICD-10-CM | POA: Diagnosis not present

## 2017-06-17 DIAGNOSIS — M545 Low back pain: Secondary | ICD-10-CM | POA: Diagnosis not present

## 2017-06-17 DIAGNOSIS — M6389 Disorders of muscle in diseases classified elsewhere, multiple sites: Secondary | ICD-10-CM | POA: Diagnosis not present

## 2017-06-17 DIAGNOSIS — I5089 Other heart failure: Secondary | ICD-10-CM | POA: Diagnosis not present

## 2017-06-18 DIAGNOSIS — M545 Low back pain: Secondary | ICD-10-CM | POA: Diagnosis not present

## 2017-06-18 DIAGNOSIS — R278 Other lack of coordination: Secondary | ICD-10-CM | POA: Diagnosis not present

## 2017-06-18 DIAGNOSIS — I5089 Other heart failure: Secondary | ICD-10-CM | POA: Diagnosis not present

## 2017-06-18 DIAGNOSIS — M6389 Disorders of muscle in diseases classified elsewhere, multiple sites: Secondary | ICD-10-CM | POA: Diagnosis not present

## 2017-06-21 DIAGNOSIS — I5089 Other heart failure: Secondary | ICD-10-CM | POA: Diagnosis not present

## 2017-06-21 DIAGNOSIS — M545 Low back pain: Secondary | ICD-10-CM | POA: Diagnosis not present

## 2017-06-21 DIAGNOSIS — M6389 Disorders of muscle in diseases classified elsewhere, multiple sites: Secondary | ICD-10-CM | POA: Diagnosis not present

## 2017-06-21 DIAGNOSIS — R278 Other lack of coordination: Secondary | ICD-10-CM | POA: Diagnosis not present

## 2017-06-25 DIAGNOSIS — M6389 Disorders of muscle in diseases classified elsewhere, multiple sites: Secondary | ICD-10-CM | POA: Diagnosis not present

## 2017-06-25 DIAGNOSIS — I5089 Other heart failure: Secondary | ICD-10-CM | POA: Diagnosis not present

## 2017-06-25 DIAGNOSIS — R278 Other lack of coordination: Secondary | ICD-10-CM | POA: Diagnosis not present

## 2017-06-25 DIAGNOSIS — M545 Low back pain: Secondary | ICD-10-CM | POA: Diagnosis not present

## 2017-06-26 ENCOUNTER — Encounter: Payer: Medicare Other | Admitting: Internal Medicine

## 2017-06-26 DIAGNOSIS — M545 Low back pain: Secondary | ICD-10-CM | POA: Diagnosis not present

## 2017-06-26 DIAGNOSIS — R278 Other lack of coordination: Secondary | ICD-10-CM | POA: Diagnosis not present

## 2017-06-26 DIAGNOSIS — M6389 Disorders of muscle in diseases classified elsewhere, multiple sites: Secondary | ICD-10-CM | POA: Diagnosis not present

## 2017-06-26 DIAGNOSIS — I5089 Other heart failure: Secondary | ICD-10-CM | POA: Diagnosis not present

## 2017-06-27 DIAGNOSIS — M6389 Disorders of muscle in diseases classified elsewhere, multiple sites: Secondary | ICD-10-CM | POA: Diagnosis not present

## 2017-06-27 DIAGNOSIS — I5089 Other heart failure: Secondary | ICD-10-CM | POA: Diagnosis not present

## 2017-06-27 DIAGNOSIS — R278 Other lack of coordination: Secondary | ICD-10-CM | POA: Diagnosis not present

## 2017-06-27 DIAGNOSIS — M545 Low back pain: Secondary | ICD-10-CM | POA: Diagnosis not present

## 2017-07-02 DIAGNOSIS — M545 Low back pain: Secondary | ICD-10-CM | POA: Diagnosis not present

## 2017-07-02 DIAGNOSIS — R278 Other lack of coordination: Secondary | ICD-10-CM | POA: Diagnosis not present

## 2017-07-02 DIAGNOSIS — B351 Tinea unguium: Secondary | ICD-10-CM | POA: Diagnosis not present

## 2017-07-02 DIAGNOSIS — M6389 Disorders of muscle in diseases classified elsewhere, multiple sites: Secondary | ICD-10-CM | POA: Diagnosis not present

## 2017-07-02 DIAGNOSIS — I5089 Other heart failure: Secondary | ICD-10-CM | POA: Diagnosis not present

## 2017-07-03 DIAGNOSIS — M6389 Disorders of muscle in diseases classified elsewhere, multiple sites: Secondary | ICD-10-CM | POA: Diagnosis not present

## 2017-07-03 DIAGNOSIS — R278 Other lack of coordination: Secondary | ICD-10-CM | POA: Diagnosis not present

## 2017-07-03 DIAGNOSIS — M545 Low back pain: Secondary | ICD-10-CM | POA: Diagnosis not present

## 2017-07-03 DIAGNOSIS — I5089 Other heart failure: Secondary | ICD-10-CM | POA: Diagnosis not present

## 2017-07-04 DIAGNOSIS — I5089 Other heart failure: Secondary | ICD-10-CM | POA: Diagnosis not present

## 2017-07-04 DIAGNOSIS — M545 Low back pain: Secondary | ICD-10-CM | POA: Diagnosis not present

## 2017-07-04 DIAGNOSIS — R278 Other lack of coordination: Secondary | ICD-10-CM | POA: Diagnosis not present

## 2017-07-04 DIAGNOSIS — M6389 Disorders of muscle in diseases classified elsewhere, multiple sites: Secondary | ICD-10-CM | POA: Diagnosis not present

## 2017-07-08 DIAGNOSIS — M545 Low back pain: Secondary | ICD-10-CM | POA: Diagnosis not present

## 2017-07-08 DIAGNOSIS — M6389 Disorders of muscle in diseases classified elsewhere, multiple sites: Secondary | ICD-10-CM | POA: Diagnosis not present

## 2017-07-08 DIAGNOSIS — R278 Other lack of coordination: Secondary | ICD-10-CM | POA: Diagnosis not present

## 2017-07-08 DIAGNOSIS — I5089 Other heart failure: Secondary | ICD-10-CM | POA: Diagnosis not present

## 2017-07-09 DIAGNOSIS — I5089 Other heart failure: Secondary | ICD-10-CM | POA: Diagnosis not present

## 2017-07-09 DIAGNOSIS — M545 Low back pain: Secondary | ICD-10-CM | POA: Diagnosis not present

## 2017-07-09 DIAGNOSIS — R278 Other lack of coordination: Secondary | ICD-10-CM | POA: Diagnosis not present

## 2017-07-09 DIAGNOSIS — M6389 Disorders of muscle in diseases classified elsewhere, multiple sites: Secondary | ICD-10-CM | POA: Diagnosis not present

## 2017-07-10 ENCOUNTER — Encounter: Payer: Self-pay | Admitting: Internal Medicine

## 2017-07-10 ENCOUNTER — Non-Acute Institutional Stay: Payer: Medicare Other | Admitting: Internal Medicine

## 2017-07-10 VITALS — BP 108/60 | HR 72 | Temp 98.3°F | Wt 107.0 lb

## 2017-07-10 DIAGNOSIS — L89610 Pressure ulcer of right heel, unstageable: Secondary | ICD-10-CM | POA: Diagnosis not present

## 2017-07-10 DIAGNOSIS — Z950 Presence of cardiac pacemaker: Secondary | ICD-10-CM | POA: Insufficient documentation

## 2017-07-10 DIAGNOSIS — F0151 Vascular dementia with behavioral disturbance: Secondary | ICD-10-CM | POA: Diagnosis not present

## 2017-07-10 DIAGNOSIS — I442 Atrioventricular block, complete: Secondary | ICD-10-CM | POA: Diagnosis not present

## 2017-07-10 DIAGNOSIS — E43 Unspecified severe protein-calorie malnutrition: Secondary | ICD-10-CM | POA: Diagnosis not present

## 2017-07-10 DIAGNOSIS — M8448XA Pathological fracture, other site, initial encounter for fracture: Secondary | ICD-10-CM

## 2017-07-10 DIAGNOSIS — M810 Age-related osteoporosis without current pathological fracture: Secondary | ICD-10-CM | POA: Insufficient documentation

## 2017-07-10 DIAGNOSIS — R634 Abnormal weight loss: Secondary | ICD-10-CM | POA: Insufficient documentation

## 2017-07-10 DIAGNOSIS — F01518 Vascular dementia, unspecified severity, with other behavioral disturbance: Secondary | ICD-10-CM

## 2017-07-10 DIAGNOSIS — I5042 Chronic combined systolic (congestive) and diastolic (congestive) heart failure: Secondary | ICD-10-CM

## 2017-07-10 DIAGNOSIS — I4892 Unspecified atrial flutter: Secondary | ICD-10-CM

## 2017-07-10 DIAGNOSIS — L89152 Pressure ulcer of sacral region, stage 2: Secondary | ICD-10-CM | POA: Diagnosis not present

## 2017-07-10 HISTORY — DX: Pathological fracture, other site, initial encounter for fracture: M84.48XA

## 2017-07-10 HISTORY — DX: Age-related osteoporosis without current pathological fracture: M81.0

## 2017-07-10 HISTORY — DX: Vascular dementia with behavioral disturbance: F01.51

## 2017-07-10 HISTORY — DX: Vascular dementia, unspecified severity, with other behavioral disturbance: F01.518

## 2017-07-10 HISTORY — DX: Abnormal weight loss: R63.4

## 2017-07-10 NOTE — Progress Notes (Signed)
Provider:  Rexene Edison. Mariea Clonts, D.O., C.M.D. Location:  Occupational psychologist of Service:  Clinic (12)  Previous PCP: Burnard Bunting, MD Patient Care Team: Burnard Bunting, MD as PCP - General (Internal Medicine)  Extended Emergency Contact Information Primary Emergency Contact: [facility] Address: [address]          [place] [address] [name] of [place] Phone: [phone] Mobile Phone: [phone] Relation: Son Secondary Emergency Contact: [facility] Address: [place], [place] [address] [name] of [employer] Phone: [phone] Relation: Daughter  Code Status: FULL CODE Goals of Care: Advanced Directive information Advanced Directives 07/10/2017  Does Patient Have a Medical Advance Directive? Yes  Type of Advance Directive Living will;Healthcare Power of Attorney  Does patient want to make changes to medical advance directive? No - Patient declined  Copy of [place] in Chart? Yes  Would patient like information on creating a medical advance directive? -   Chief Complaint  Patient presents with  . Establish Care    New Patient    HPI: Patient is a 84 y.o. male seen today to establish with [facility].  Records have been requested from Dr. Jacquiline Doe office.  Mr. Vacca has moved to [place] from [place] at [place].    Feels pretty darn good.  Denies pain.  Has osteoporosis in his lower back and that gives him pain per his son.  He had not been taking his pain medication regularly before AL admission so now pain is better controlled.    He had to stop driving because he could no longer drive.  Had been eating out and going to meetings.  He'd been going to [facility] seafood.  He's now been eating in his room rather than going to the dining room.    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  He went  to [place].    Has a pacemaker for complete heart block--followed with Dr. [name] historically.  He has a unit that is in his room, but may not be set up properly.  Last hospitalization was in 2017 with mixed chf.  He's had atrial flutter.    He had a left hip fx in 2015 over Christmas--he was dancing with his granddaughter and a week later, he had serious pain and here it was broken.      He had prostate cancer years ago with radiation.  Need more records for history.  Likes his glass of chardonnay with dinner.  Used to be two glasses.    He feels he's adjusting well to his new apt.  Says he's been in good spirits all along.     Takes a diuretic for bp.  Also takes potassium with it.   He drinks juices.  Has water by the bedside.  He does drink this and some gatorade.  Memory:  Pt reports it is good.  He is on aricept.    He has never weighed over 150 lbs.  His weight is down to 107 as of today.  Last recorded in epic was June of 2017 at 125.8 lbs.  He does not eat a lot of food.  Tends to eat half of a sandwich for example, a little veggies and dessert.  He will eat more in the dining room.    Some incontinence of bowels and  bladder.    Past Medical History:  Diagnosis Date  . Arthritis    "nothing serious" (05/31/2014)  . Atrial flutter ([place])   . BPH (benign prostatic hyperplasia)   . CHF (congestive heart failure) ([place])   . Complete heart block ([place]) 08/2013   s/p STJ dual chamber pacemaker implant  . Paroxysmal atrial flutter ([place])   . Prostate cancer [facility])    radiation   Past Surgical History:  Procedure Laterality Date  . ANTERIOR CRUCIATE LIGAMENT REPAIR Right ~ 1950  . HIP ARTHROPLASTY Left 06/01/2014   Procedure: LEFT HIP HEMI ARTHROPLASTY ;  Surgeon: Renette Butters, MD;  Location: [facility];  Service: Orthopedics;  Laterality: Left;  Requesting Larkin Ina or April RNFA  . NM MYOCAR PERF WALL MOTION  07/2010   bruce myoview - perfusion defect in basal-apical inferior region  (more at rest than stress) - non-gated (A-Flutter), low risk  . PERMANENT PACEMAKER INSERTION N/A 08/21/2013   STJ dual chamber PPM implanted for CHB  . TONSILLECTOMY  ~ 1935  . TOTAL HIP ARTHROPLASTY Right 08/2010   Dr. French Ana  . TRANSTHORACIC ECHOCARDIOGRAM  08/2010   EF 55-60%, mod conc hypertrophy, grade 1 diastolic dysfunction, AV sclerosis, mild regurg; RA mildly dilated    Social History   Socioeconomic History  . Marital status: Widowed    Spouse name: None  . Number of children: 2  . Years of education: None  . Highest education level: None  Social Needs  . Financial resource strain: None  . Food insecurity - worry: None  . Food insecurity - inability: None  . Transportation needs - medical: None  . Transportation needs - non-medical: None  Occupational History  . None  Tobacco Use  . Smoking status: Passive Smoke Exposure - Never Smoker  . Smokeless tobacco: Never Used  Substance and Sexual Activity  . Alcohol use: Yes    Alcohol/week: 2.3 oz    Types: 3 Glasses of wine, 1 Standard drinks or equivalent per week  . Drug use: No  . Sexual activity: No  Other Topics Concern  . None  Social History Narrative  . None    reports that he is a non-smoker but has been exposed to tobacco smoke. he has never used smokeless tobacco. He reports that he drinks about 2.3 oz of alcohol per week. He reports that he does not use drugs.  Functional Status Survey:    No family history on file.  Health Maintenance  Topic Date Due  . TETANUS/TDAP  10/31/1947  . PNA vac Low Risk Adult (1 of 2 - PCV13) 10/30/1993  . INFLUENZA VACCINE  01/09/2017    No Known Allergies  Outpatient Encounter Medications as of 07/10/2017  Medication Sig  . acetaminophen (TYLENOL) 325 MG tablet Take 650 mg by mouth 3 (three) times daily as needed.  . donepezil (ARICEPT) 10 MG tablet Take 10 mg by mouth at bedtime.   . hydrochlorothiazide (MICROZIDE) 12.5 MG capsule TAKE 1 CAPSULE BY MOUTH DAILY    . HYDROcodone-acetaminophen (NORCO) 7.5-325 MG tablet Take 1 tablet by mouth 3 (three) times daily as needed.   [place] Kitchen LORazepam (ATIVAN) 0.5 MG tablet Take 0.5 mg by mouth every 6 (six) hours as needed for anxiety.   . Multiple Vitamin (MULTIVITAMIN) tablet Take 1 tablet by mouth daily.  . potassium chloride (K-DUR,KLOR-CON) 10 MEQ tablet TAKE 1 TABLET BY MOUTH EVERY DAY  . sertraline (ZOLOFT) 50 MG tablet Take 50 mg by mouth at bedtime.   . [  DISCONTINUED] aspirin 81 MG tablet Take 81 mg by mouth daily.  . [DISCONTINUED] hydrALAZINE (APRESOLINE) 10 MG tablet Take 10 mg by mouth 2 (two) times daily.  . [DISCONTINUED] mirabegron ER (MYRBETRIQ) 25 MG TB24 tablet Take 25 mg by mouth at bedtime.  . [DISCONTINUED] nitroGLYCERIN (NITROSTAT) 0.4 MG SL tablet Place 1 tablet (0.4 mg total) under the tongue every 5 (five) minutes x 3 doses as needed for chest pain.   No facility-administered encounter medications on file as of 07/10/2017.     Review of Systems  Constitutional: Positive for weight loss. Negative for chills, fever and malaise/fatigue.  HENT: Negative for congestion and hearing loss.   Eyes: Negative for blurred vision.  Respiratory: Negative for shortness of breath.   Cardiovascular: Negative for chest pain, palpitations and leg swelling.  Gastrointestinal: Positive for diarrhea. Negative for abdominal pain, blood in stool, constipation and melena.       Fecal incontinence  Genitourinary: Positive for urgency. Negative for dysuria.       Urinary incontinence  Musculoskeletal: Positive for back pain. Negative for falls.  Skin:       Stage 2 coccyx, unstageable right heel  Neurological: Negative for dizziness, seizures and weakness.       Dementia, poor historian  Endo/Heme/Allergies: Bruises/bleeds easily.  Psychiatric/Behavioral: Negative for depression and memory loss. The patient is not nervous/anxious and does not have insomnia.        Easily agitated about the topic of having more  help with things    Vitals:   07/10/17 1347  BP: 108/60  Pulse: 72  Temp: 98.3 F (36.8 C)  TempSrc: Oral  SpO2: 97%  Weight: 107 lb (48.5 kg)   Body mass index is 15.35 kg/m. Physical Exam  Constitutional: No distress.  Frail male in transport chair, is ambulatory, but unsteady  HENT:  Head: Normocephalic and atraumatic.  Right Ear: External ear normal.  Left Ear: External ear normal.  Nose: Nose normal.  Mouth/Throat: Oropharynx is clear and moist. No oropharyngeal exudate.  Eyes: Conjunctivae and EOM are normal. Pupils are equal, round, and reactive to light.  glasses  Neck: Normal range of motion. Neck supple. No JVD present.  Cardiovascular: Intact distal pulses.  irreg irreg  Pulmonary/Chest: Effort normal and breath sounds normal. No respiratory distress.  Abdominal: Soft. Bowel sounds are normal. He exhibits no distension. There is no tenderness.  Musculoskeletal: Normal range of motion.  Severe kyphosis, stooped posture  Lymphadenopathy:    He has no cervical adenopathy.  Neurological: He is alert. No cranial nerve deficit.  Skin: Capillary refill takes less than 2 seconds.  Very dry scaly skin of hands, arms, legs and feet; right heel scaly skin over ulcer and small stage 2 coccyx ulcer  Psychiatric: He has a normal mood and affect.  Was very pleasant and cordial one moment and then adamantly disagreeing to interventions the very next    Labs reviewed: Basic Metabolic Panel: No results for input(s): NA, K, CL, CO2, GLUCOSE, BUN, CREATININE, CALCIUM, MG, PHOS in the last 8760 hours. Liver Function Tests: No results for input(s): AST, ALT, ALKPHOS, BILITOT, PROT, ALBUMIN in the last 8760 hours. No results for input(s): LIPASE, AMYLASE in the last 8760 hours. No results for input(s): AMMONIA in the last 8760 hours. CBC: No results for input(s): WBC, NEUTROABS, HGB, HCT, MCV, PLT in the last 8760 hours. Cardiac Enzymes: No results for input(s): CKTOTAL, CKMB,  CKMBINDEX, TROPONINI in the last 8760 hours. BNP: Invalid input(s): POCBNP Lab  Results  Component Value Date   HGBA1C 5.6 12/06/2015   Lab Results  Component Value Date   TSH 3.810 08/20/2013    Assessment/Plan 1. Complete heart block ([place]) 2015 s/p pacemaker Had followed with Dr. [name]  2. Chronic combined systolic and diastolic congestive heart failure (HCC) Stable, only on hctz with potassium at this point--?needs hctz at this point with his poor po intake  3. Paroxysmal atrial flutter (HCC) -s/p pacemaker -needs to be set up so it can be interrogated in his apt  4. Senile osteoporosis -not currently on vitamin D or calcium, uses wheelchair in enhanced AL--if continues to decline, will be skilled level of care (has OT now working with him), not on fosamax or prolia and prognosis poor overall  5. Pathological fracture of lumbar vertebra, initial encounter -biggest source of pain and discomfort and was reason for move per son -cont hydrocodone and tylenol for pain not to exceed 3000mg  per day of acetaminophen  6. Vascular dementia with behavior disturbance -other reason for need for increased assistance, continues on aricept and is on zoloft which has helped his spirits and decreased his behaviors during care, but still does have som agitation and combativeness -loves sweets as his typical but still losing weight  7. S/P placement of cardiac pacemaker -needs proper set-up for interrogation  8. Decubitus ulcer of coccyx, stage 2 -seen by wound care nurse today who is addressing orders for this -air mattress ordered  -boost ordered daily -encouraged mobility and attendance in acitivities  9. Unstageable pressure ulcer of right heel ([place]) -wound care nurse saw today, has a lot of dry skin over it so not clearly stageable at present, offload pressure and ensure comfortable shoewear that is not worsening this  10. Severe protein-calorie malnutrition ([place]) -boost daily,  encourage po  11. Weight loss, unintentional Weight every other week Dietary consult Chocolate boost daily between lunch and dinner  Encouraged resident to accept phlebotomist if she wakes him early b/c it will only be once every 3-4 mos and I need his blood sample to see how he is doing  Get records from Dr. Reynaldo Minium (immunizations?)  Labs/tests ordered:  Cbc, cmp, prealbumin next draw in am  Aquarius Tremper L. Amyah Clawson, D.O. [facility] [address], [place] [address] Cell Phone (Mon-Fri 8am-5pm):  [phone] On Call:  [phone] & follow prompts after 5pm & weekends Office Phone:  [phone] Office Fax:  [phone]

## 2017-07-11 DIAGNOSIS — I5089 Other heart failure: Secondary | ICD-10-CM | POA: Diagnosis not present

## 2017-07-11 DIAGNOSIS — M545 Low back pain: Secondary | ICD-10-CM | POA: Diagnosis not present

## 2017-07-11 DIAGNOSIS — R278 Other lack of coordination: Secondary | ICD-10-CM | POA: Diagnosis not present

## 2017-07-11 DIAGNOSIS — D649 Anemia, unspecified: Secondary | ICD-10-CM | POA: Diagnosis not present

## 2017-07-11 DIAGNOSIS — M6389 Disorders of muscle in diseases classified elsewhere, multiple sites: Secondary | ICD-10-CM | POA: Diagnosis not present

## 2017-07-11 DIAGNOSIS — Z79899 Other long term (current) drug therapy: Secondary | ICD-10-CM | POA: Diagnosis not present

## 2017-07-11 LAB — CBC AND DIFFERENTIAL
HEMATOCRIT: 35 — AB (ref 41–53)
HEMOGLOBIN: 11.8 — AB (ref 13.5–17.5)
PLATELETS: 231 (ref 150–399)
WBC: 10.9

## 2017-07-11 LAB — BASIC METABOLIC PANEL
BUN: 37 — AB (ref 4–21)
Creatinine: 1.3 (ref 0.6–1.3)
Glucose: 98
POTASSIUM: 3.7 (ref 3.4–5.3)
Sodium: 143 (ref 137–147)

## 2017-07-11 LAB — HEPATIC FUNCTION PANEL
ALT: 11 (ref 10–40)
AST: 19 (ref 14–40)
Alkaline Phosphatase: 70 (ref 25–125)
BILIRUBIN, TOTAL: 0.6

## 2017-07-12 ENCOUNTER — Encounter: Payer: Self-pay | Admitting: *Deleted

## 2017-07-15 DIAGNOSIS — M6389 Disorders of muscle in diseases classified elsewhere, multiple sites: Secondary | ICD-10-CM | POA: Diagnosis not present

## 2017-07-15 DIAGNOSIS — M545 Low back pain: Secondary | ICD-10-CM | POA: Diagnosis not present

## 2017-07-15 DIAGNOSIS — I5089 Other heart failure: Secondary | ICD-10-CM | POA: Diagnosis not present

## 2017-07-15 DIAGNOSIS — R278 Other lack of coordination: Secondary | ICD-10-CM | POA: Diagnosis not present

## 2017-07-17 DIAGNOSIS — D649 Anemia, unspecified: Secondary | ICD-10-CM | POA: Diagnosis not present

## 2017-07-17 DIAGNOSIS — E611 Iron deficiency: Secondary | ICD-10-CM | POA: Diagnosis not present

## 2017-07-17 DIAGNOSIS — I5089 Other heart failure: Secondary | ICD-10-CM | POA: Diagnosis not present

## 2017-07-17 DIAGNOSIS — D518 Other vitamin B12 deficiency anemias: Secondary | ICD-10-CM | POA: Diagnosis not present

## 2017-07-17 DIAGNOSIS — R278 Other lack of coordination: Secondary | ICD-10-CM | POA: Diagnosis not present

## 2017-07-17 DIAGNOSIS — M545 Low back pain: Secondary | ICD-10-CM | POA: Diagnosis not present

## 2017-07-17 DIAGNOSIS — M6389 Disorders of muscle in diseases classified elsewhere, multiple sites: Secondary | ICD-10-CM | POA: Diagnosis not present

## 2017-07-17 LAB — IRON,TIBC AND FERRITIN PANEL
Ferritin: 300.9
Iron: 59

## 2017-07-17 LAB — VITAMIN B12: Vitamin B-12: 506

## 2017-07-18 DIAGNOSIS — R278 Other lack of coordination: Secondary | ICD-10-CM | POA: Diagnosis not present

## 2017-07-18 DIAGNOSIS — M6389 Disorders of muscle in diseases classified elsewhere, multiple sites: Secondary | ICD-10-CM | POA: Diagnosis not present

## 2017-07-18 DIAGNOSIS — I5089 Other heart failure: Secondary | ICD-10-CM | POA: Diagnosis not present

## 2017-07-18 DIAGNOSIS — M545 Low back pain: Secondary | ICD-10-CM | POA: Diagnosis not present

## 2017-07-19 ENCOUNTER — Encounter: Payer: Self-pay | Admitting: Nurse Practitioner

## 2017-07-19 ENCOUNTER — Encounter: Payer: Medicare Other | Admitting: Nurse Practitioner

## 2017-07-19 NOTE — Progress Notes (Deleted)
Electrophysiology Office Note Date: 07/19/2017  ID:  Justin Orozco, Justin Orozco 06/23/1926, MRN [id number]  PCP: Burnard Bunting, MD Primary Cardiologist: [name] Electrophysiologist: Allred  CC: Pacemaker follow-up  Justin Orozco is a 84 y.o. male seen today for Dr Rayann Heman.  He presents today for routine electrophysiology followup.  He has not been seen for pacemaker check since 2017. Since last being seen in our clinic, the patient reports doing reasonably well.  He denies chest pain, palpitations, dyspnea, PND, orthopnea, nausea, vomiting, dizziness, syncope.  He is now living at [facility] in [place].   Device History: STJ dual chamber PPM implanted 2015 for complete heart block   Past Medical History:  Diagnosis Date  . Arthritis   . BPH (benign prostatic hyperplasia)   . CHF (congestive heart failure) ([place])   . Complete heart block ([place]) 08/2013   s/p STJ dual chamber pacemaker implant  . Paroxysmal atrial flutter ([place])   . Prostate cancer [facility])    radiation   Past Surgical History:  Procedure Laterality Date  . ANTERIOR CRUCIATE LIGAMENT REPAIR Right ~ 1950  . HIP ARTHROPLASTY Left 06/01/2014   Procedure: LEFT HIP HEMI ARTHROPLASTY ;  Surgeon: Renette Butters, MD;  Location: [facility];  Service: Orthopedics;  Laterality: Left;  Requesting Larkin Ina or April RNFA  . NM MYOCAR PERF WALL MOTION  07/2010   bruce myoview - perfusion defect in basal-apical inferior region (more at rest than stress) - non-gated (A-Flutter), low risk  . PERMANENT PACEMAKER INSERTION N/A 08/21/2013   STJ dual chamber PPM implanted for CHB  . TONSILLECTOMY  ~ 1935  . TOTAL HIP ARTHROPLASTY Right 08/2010   Dr. French Ana  . TRANSTHORACIC ECHOCARDIOGRAM  08/2010   EF 55-60%, mod conc hypertrophy, grade 1 diastolic dysfunction, AV sclerosis, mild regurg; RA mildly dilated    Current Outpatient Medications  Medication Sig Dispense Refill  . acetaminophen (TYLENOL) 325 MG tablet Take 650 mg by mouth 3  (three) times daily as needed.    . donepezil (ARICEPT) 10 MG tablet Take 10 mg by mouth at bedtime.     . hydrochlorothiazide (MICROZIDE) 12.5 MG capsule TAKE 1 CAPSULE BY MOUTH DAILY 30 capsule 0  . HYDROcodone-acetaminophen (NORCO) 7.5-325 MG tablet Take 1 tablet by mouth 3 (three) times daily as needed.     Marland Kitchen LORazepam (ATIVAN) 0.5 MG tablet Take 0.5 mg by mouth every 6 (six) hours as needed for anxiety.     . Multiple Vitamin (MULTIVITAMIN) tablet Take 1 tablet by mouth daily.    . potassium chloride (K-DUR,KLOR-CON) 10 MEQ tablet TAKE 1 TABLET BY MOUTH EVERY DAY 30 tablet 0  . sertraline (ZOLOFT) 50 MG tablet Take 50 mg by mouth at bedtime.      No current facility-administered medications for this visit.     Allergies:   Patient has no known allergies.   Social History: Social History   Socioeconomic History  . Marital status: Widowed    Spouse name: Not on file  . Number of children: 2  . Years of education: Not on file  . Highest education level: Not on file  Social Needs  . Financial resource strain: Not on file  . Food insecurity - worry: Not on file  . Food insecurity - inability: Not on file  . Transportation needs - medical: Not on file  . Transportation needs - non-medical: Not on file  Occupational History  . Not on file  Tobacco Use  . Smoking status: Passive  Smoke Exposure - Never Smoker  . Smokeless tobacco: Never Used  Substance and Sexual Activity  . Alcohol use: Yes    Alcohol/week: 2.3 oz    Types: 3 Glasses of wine, 1 Standard drinks or equivalent per week  . Drug use: No  . Sexual activity: No  Other Topics Concern  . Not on file  Social History Narrative  . Not on file    Review of Systems: All other systems reviewed and are otherwise negative except as noted above.   Physical Exam: VS:  There were no vitals taken for this visit. , BMI There is no height or weight on file to calculate BMI.  GEN- The patient is elderly appearing, alert and  oriented x 3 today.   HEENT: normocephalic, atraumatic; sclera clear, conjunctiva pink; hearing intact; oropharynx clear; neck supple  Lungs- Clear to ausculation bilaterally, normal work of breathing.  No wheezes, rales, rhonchi Heart- Regular rate and rhythm  GI- soft, non-tender, non-distended, bowel sounds present  Extremities- no clubbing, cyanosis, or edema  MS- no significant deformity or atrophy Skin- warm and dry, no rash or lesion; PPM pocket well healed Psych- euthymic mood, full affect Neuro- strength and sensation are intact  PPM Interrogation- reviewed in detail today,  See PACEART report  EKG:  EKG is ordered today. EKG today demonstrates ***  Recent Labs: 07/11/2017: ALT 11; BUN 37; Creatinine 1.3; Hemoglobin 11.8; Platelets 231; Potassium 3.7; Sodium 143   Wt Readings from Last 3 Encounters:  07/10/17 107 lb (48.5 kg)  12/07/15 125 lb 12.8 oz (57.1 kg)  03/30/15 131 lb (59.4 kg)     Other studies Reviewed: Additional studies/ records that were reviewed today include: Dr Jackalyn Lombard office notes   Assessment and Plan:  1.  Complete heart block Normal PPM function See Pace Art report No changes today  2.  Atrial flutter Continues to be asymptomatic Episodes are all < 20 minutes CHADS2VASC is 3.  If he has increased atrial arrhythmia burden, may need to consider [place].    Current medicines are reviewed at length with the patient today.   The patient does not have concerns regarding his medicines.  The following changes were made today:  none  Labs/ tests ordered today include: none   Disposition:   Follow up with Dr Rayann Heman in 1 year. The patient continues to decline remote monitoring  Signed, Chanetta Marshall, NP 07/19/2017 11:20 AM  [place] [id number] [phone] (office) [phone] (fax)

## 2017-07-22 DIAGNOSIS — M6389 Disorders of muscle in diseases classified elsewhere, multiple sites: Secondary | ICD-10-CM | POA: Diagnosis not present

## 2017-07-22 DIAGNOSIS — R278 Other lack of coordination: Secondary | ICD-10-CM | POA: Diagnosis not present

## 2017-07-22 DIAGNOSIS — M545 Low back pain: Secondary | ICD-10-CM | POA: Diagnosis not present

## 2017-07-22 DIAGNOSIS — I5089 Other heart failure: Secondary | ICD-10-CM | POA: Diagnosis not present

## 2017-07-23 ENCOUNTER — Encounter: Payer: Self-pay | Admitting: Internal Medicine

## 2017-07-25 DIAGNOSIS — I5089 Other heart failure: Secondary | ICD-10-CM | POA: Diagnosis not present

## 2017-07-25 DIAGNOSIS — R278 Other lack of coordination: Secondary | ICD-10-CM | POA: Diagnosis not present

## 2017-07-25 DIAGNOSIS — M6389 Disorders of muscle in diseases classified elsewhere, multiple sites: Secondary | ICD-10-CM | POA: Diagnosis not present

## 2017-07-25 DIAGNOSIS — M545 Low back pain: Secondary | ICD-10-CM | POA: Diagnosis not present

## 2017-07-26 DIAGNOSIS — M545 Low back pain: Secondary | ICD-10-CM | POA: Diagnosis not present

## 2017-07-26 DIAGNOSIS — I5089 Other heart failure: Secondary | ICD-10-CM | POA: Diagnosis not present

## 2017-07-26 DIAGNOSIS — R278 Other lack of coordination: Secondary | ICD-10-CM | POA: Diagnosis not present

## 2017-07-26 DIAGNOSIS — M6389 Disorders of muscle in diseases classified elsewhere, multiple sites: Secondary | ICD-10-CM | POA: Diagnosis not present

## 2017-07-27 IMAGING — CR DG CHEST 2V
2 series · 2 of 2 positions shown · non-contrast
Comparison: 05/31/2014

CLINICAL DATA: Left side chest pain beginning last night.

EXAM:
CHEST  2 VIEW

[chest pa]
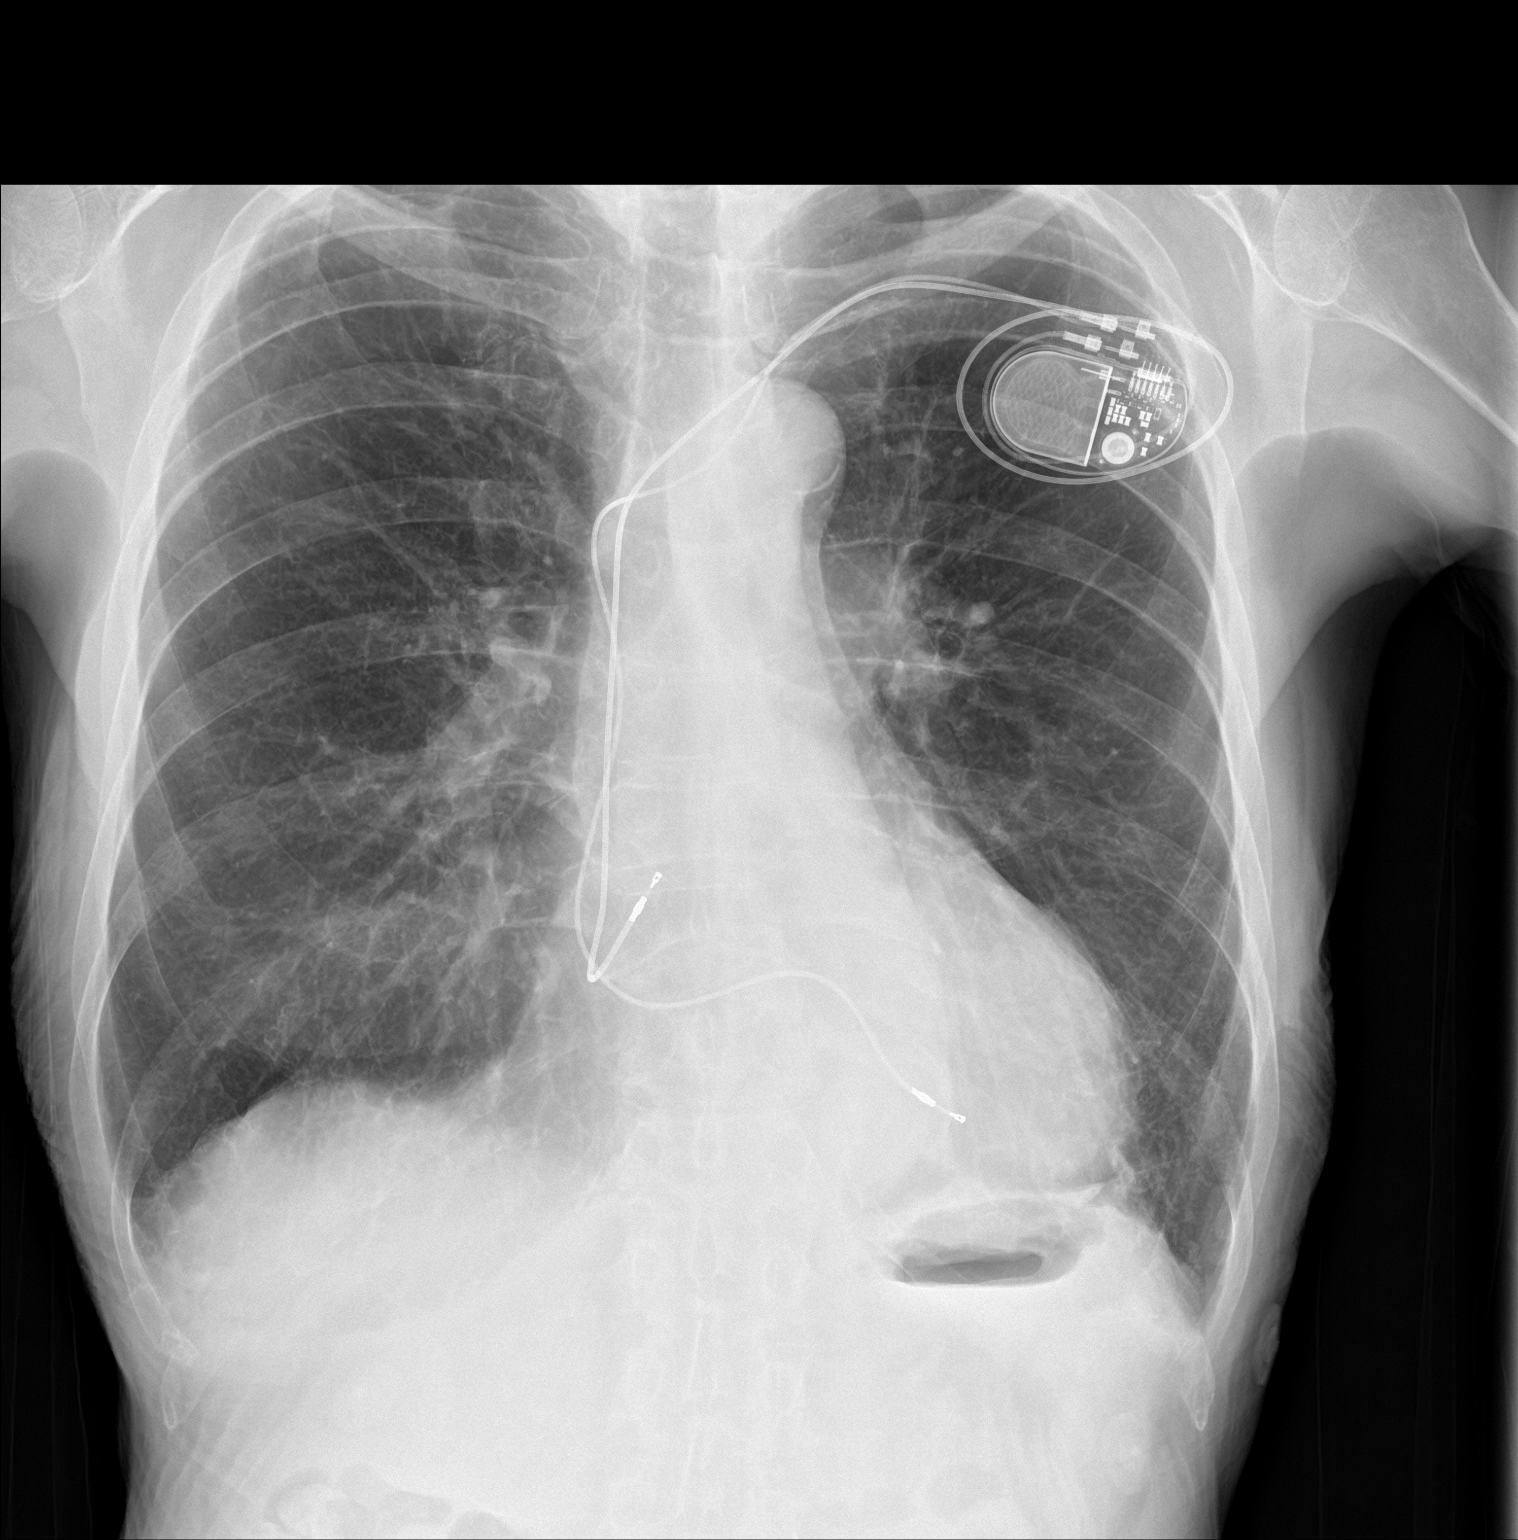

[chest lat]
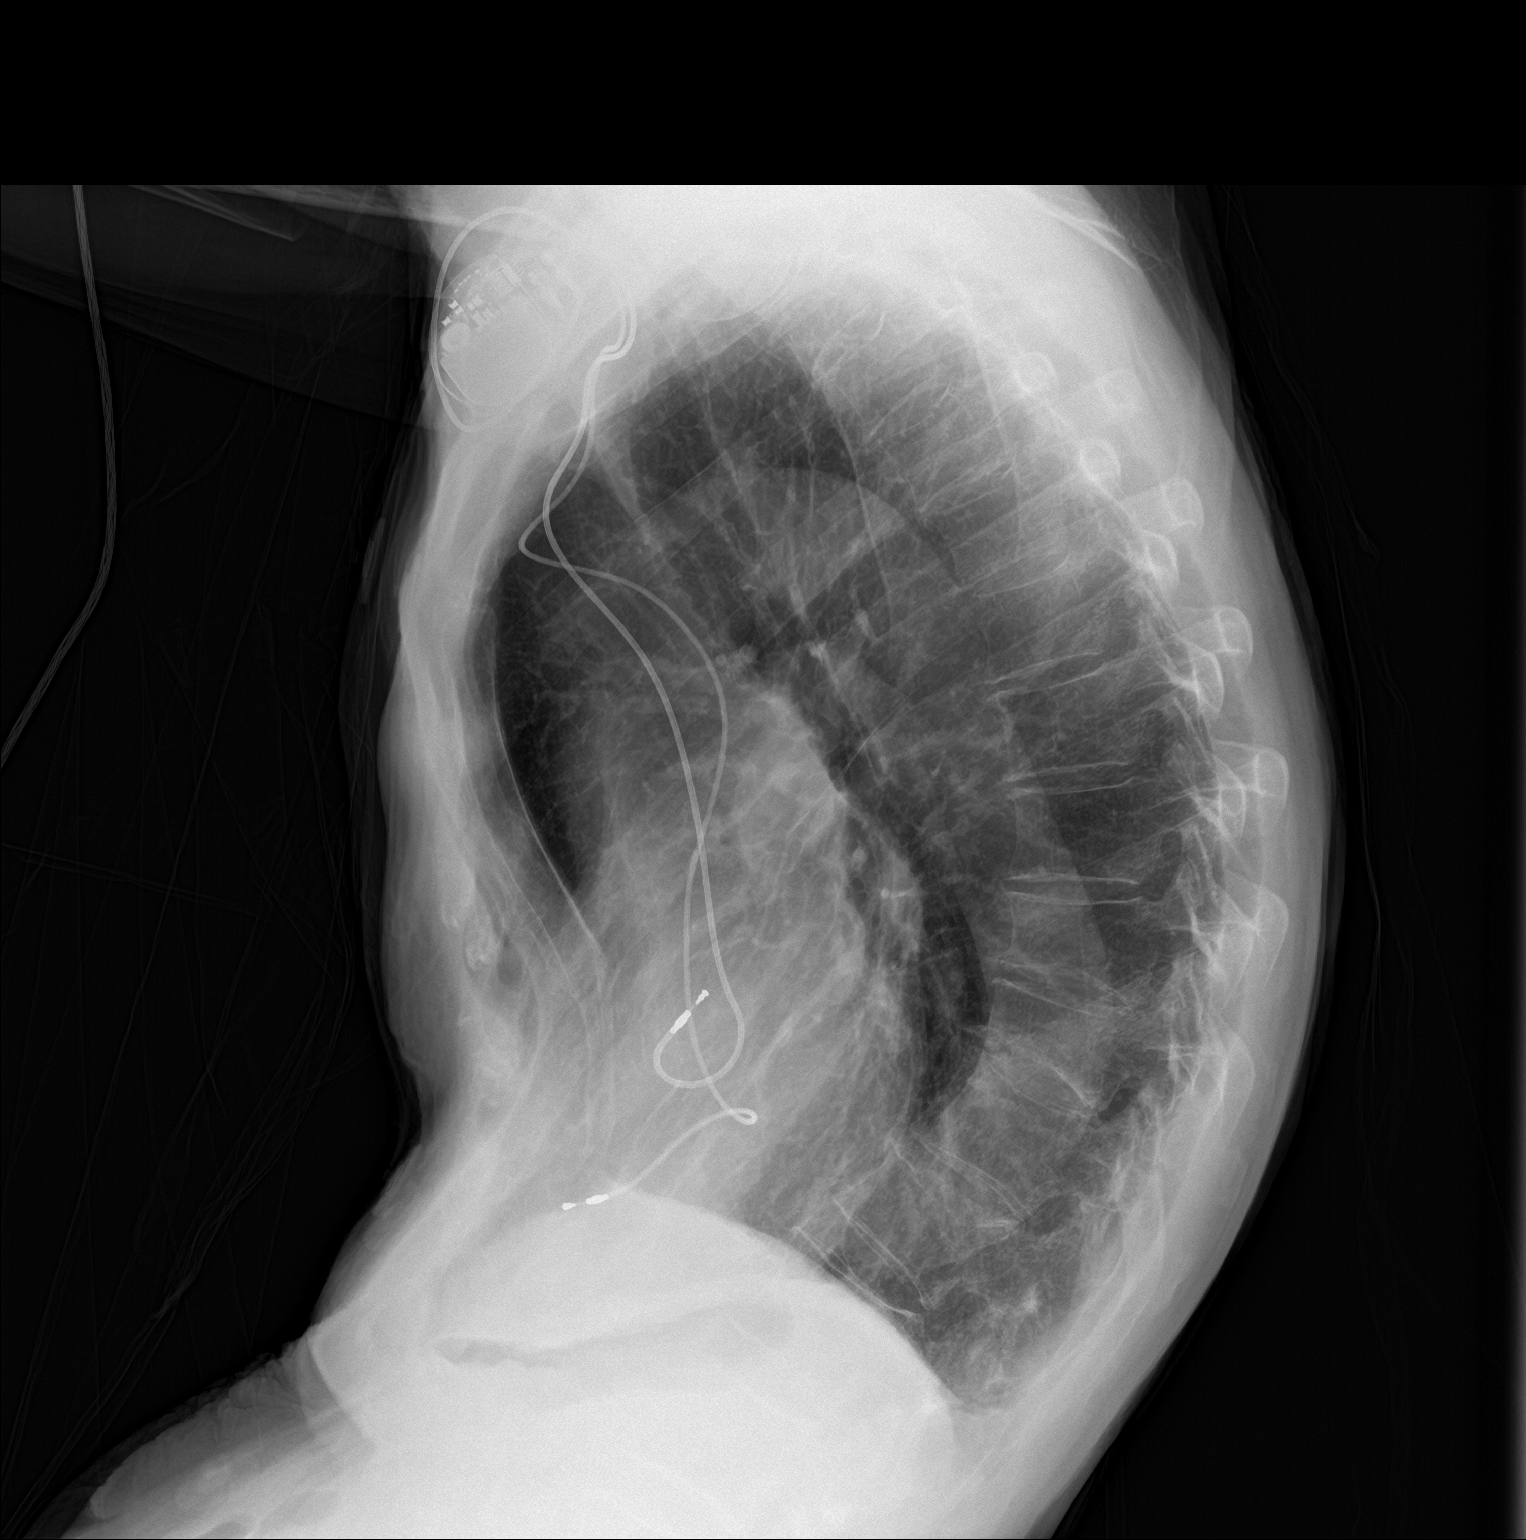

[2 of 2 positions shown; findings below may reference images not displayed]

FINDINGS: There is hyperinflation of the lungs compatible with COPD. Left
pacer remains in place, unchanged. Heart and mediastinal contours
are within normal limits. No focal opacities or effusions. No acute
bony abnormality.
IMPRESSION: No active cardiopulmonary disease.  COPD.

## 2017-07-29 DIAGNOSIS — M6389 Disorders of muscle in diseases classified elsewhere, multiple sites: Secondary | ICD-10-CM | POA: Diagnosis not present

## 2017-07-29 DIAGNOSIS — I5089 Other heart failure: Secondary | ICD-10-CM | POA: Diagnosis not present

## 2017-07-29 DIAGNOSIS — M545 Low back pain: Secondary | ICD-10-CM | POA: Diagnosis not present

## 2017-07-29 DIAGNOSIS — R278 Other lack of coordination: Secondary | ICD-10-CM | POA: Diagnosis not present

## 2017-07-31 DIAGNOSIS — M545 Low back pain: Secondary | ICD-10-CM | POA: Diagnosis not present

## 2017-07-31 DIAGNOSIS — M6389 Disorders of muscle in diseases classified elsewhere, multiple sites: Secondary | ICD-10-CM | POA: Diagnosis not present

## 2017-07-31 DIAGNOSIS — I5089 Other heart failure: Secondary | ICD-10-CM | POA: Diagnosis not present

## 2017-07-31 DIAGNOSIS — R278 Other lack of coordination: Secondary | ICD-10-CM | POA: Diagnosis not present

## 2017-08-01 DIAGNOSIS — R278 Other lack of coordination: Secondary | ICD-10-CM | POA: Diagnosis not present

## 2017-08-01 DIAGNOSIS — M545 Low back pain: Secondary | ICD-10-CM | POA: Diagnosis not present

## 2017-08-01 DIAGNOSIS — M6389 Disorders of muscle in diseases classified elsewhere, multiple sites: Secondary | ICD-10-CM | POA: Diagnosis not present

## 2017-08-01 DIAGNOSIS — I5089 Other heart failure: Secondary | ICD-10-CM | POA: Diagnosis not present

## 2017-08-05 DIAGNOSIS — M6389 Disorders of muscle in diseases classified elsewhere, multiple sites: Secondary | ICD-10-CM | POA: Diagnosis not present

## 2017-08-05 DIAGNOSIS — M545 Low back pain: Secondary | ICD-10-CM | POA: Diagnosis not present

## 2017-08-05 DIAGNOSIS — R278 Other lack of coordination: Secondary | ICD-10-CM | POA: Diagnosis not present

## 2017-08-05 DIAGNOSIS — I5089 Other heart failure: Secondary | ICD-10-CM | POA: Diagnosis not present

## 2017-08-07 ENCOUNTER — Non-Acute Institutional Stay: Payer: Medicare Other

## 2017-08-07 DIAGNOSIS — Z23 Encounter for immunization: Secondary | ICD-10-CM | POA: Diagnosis not present

## 2017-08-07 DIAGNOSIS — Z0000 Encounter for general adult medical examination without abnormal findings: Secondary | ICD-10-CM | POA: Diagnosis not present

## 2017-08-07 NOTE — Patient Instructions (Addendum)
Justin Orozco , Thank you for taking time to come for your Medicare Wellness Visit. I appreciate your ongoing commitment to your health goals. Please review the following plan we discussed and let me know if I can assist you in the future.   Screening recommendations/referrals: Colonoscopy excluded, pt is over age 82 Recommended yearly ophthalmology/optometry visit for glaucoma screening and checkup Recommended yearly dental visit for hygiene and checkup  Vaccinations: Influenza vaccine due, ordered Pneumococcal vaccine up to date Tdap vaccine up to date, due 06/16/2027 Shingles vaccine not in records    Advanced directives: in chart  Conditions/risks identified:  none  Next appointment: Dr. Mariea Clonts makes rounds  Preventive Care 72 Years and Older, Male Preventive care refers to lifestyle choices and visits with your health care provider that can promote health and wellness. What does preventive care include?  A yearly physical exam. This is also called an annual well check.  Dental exams once or twice a year.  Routine eye exams. Ask your health care provider how often you should have your eyes checked.  Personal lifestyle choices, including:  Daily care of your teeth and gums.  Regular physical activity.  Eating a healthy diet.  Avoiding tobacco and drug use.  Limiting alcohol use.  Practicing safe sex.  Taking low doses of aspirin every day.  Taking vitamin and mineral supplements as recommended by your health care provider. What happens during an annual well check? The services and screenings done by your health care provider during your annual well check will depend on your age, overall health, lifestyle risk factors, and family history of disease. Counseling  Your health care provider may ask you questions about your:  Alcohol use.  Tobacco use.  Drug use.  Emotional well-being.  Home and relationship well-being.  Sexual activity.  Eating habits.  History  of falls.  Memory and ability to understand (cognition).  Work and work Statistician. Screening  You may have the following tests or measurements:  Height, weight, and BMI.  Blood pressure.  Lipid and cholesterol levels. These may be checked every 5 years, or more frequently if you are over 40 years old.  Skin check.  Lung cancer screening. You may have this screening every year starting at age 22 if you have a 30-pack-year history of smoking and currently smoke or have quit within the past 15 years.  Fecal occult blood test (FOBT) of the stool. You may have this test every year starting at age 70.  Flexible sigmoidoscopy or colonoscopy. You may have a sigmoidoscopy every 5 years or a colonoscopy every 10 years starting at age 30.  Prostate cancer screening. Recommendations will vary depending on your family history and other risks.  Hepatitis C blood test.  Hepatitis B blood test.  Sexually transmitted disease (STD) testing.  Diabetes screening. This is done by checking your blood sugar (glucose) after you have not eaten for a while (fasting). You may have this done every 1-3 years.  Abdominal aortic aneurysm (AAA) screening. You may need this if you are a current or former smoker.  Osteoporosis. You may be screened starting at age 29 if you are at high risk. Talk with your health care provider about your test results, treatment options, and if necessary, the need for more tests. Vaccines  Your health care provider may recommend certain vaccines, such as:  Influenza vaccine. This is recommended every year.  Tetanus, diphtheria, and acellular pertussis (Tdap, Td) vaccine. You may need a Td booster every 10  years.  Zoster vaccine. You may need this after age 60.  Pneumococcal 13-valent conjugate (PCV13) vaccine. One dose is recommended after age 65.  Pneumococcal polysaccharide (PPSV23) vaccine. One dose is recommended after age 65. Talk to your health care provider  about which screenings and vaccines you need and how often you need them. This information is not intended to replace advice given to you by your health care provider. Make sure you discuss any questions you have with your health care provider. Document Released: 06/24/2015 Document Revised: 02/15/2016 Document Reviewed: 03/29/2015 Elsevier Interactive Patient Education  2017 Elsevier Inc.  Fall Prevention in the Home Falls can cause injuries. They can happen to people of all ages. There are many things you can do to make your home safe and to help prevent falls. What can I do on the outside of my home?  Regularly fix the edges of walkways and driveways and fix any cracks.  Remove anything that might make you trip as you walk through a door, such as a raised step or threshold.  Trim any bushes or trees on the path to your home.  Use bright outdoor lighting.  Clear any walking paths of anything that might make someone trip, such as rocks or tools.  Regularly check to see if handrails are loose or broken. Make sure that both sides of any steps have handrails.  Any raised decks and porches should have guardrails on the edges.  Have any leaves, snow, or ice cleared regularly.  Use sand or salt on walking paths during winter.  Clean up any spills in your garage right away. This includes oil or grease spills. What can I do in the bathroom?  Use night lights.  Install grab bars by the toilet and in the tub and shower. Do not use towel bars as grab bars.  Use non-skid mats or decals in the tub or shower.  If you need to sit down in the shower, use a plastic, non-slip stool.  Keep the floor dry. Clean up any water that spills on the floor as soon as it happens.  Remove soap buildup in the tub or shower regularly.  Attach bath mats securely with double-sided non-slip rug tape.  Do not have throw rugs and other things on the floor that can make you trip. What can I do in the  bedroom?  Use night lights.  Make sure that you have a light by your bed that is easy to reach.  Do not use any sheets or blankets that are too big for your bed. They should not hang down onto the floor.  Have a firm chair that has side arms. You can use this for support while you get dressed.  Do not have throw rugs and other things on the floor that can make you trip. What can I do in the kitchen?  Clean up any spills right away.  Avoid walking on wet floors.  Keep items that you use a lot in easy-to-reach places.  If you need to reach something above you, use a strong step stool that has a grab bar.  Keep electrical cords out of the way.  Do not use floor polish or wax that makes floors slippery. If you must use wax, use non-skid floor wax.  Do not have throw rugs and other things on the floor that can make you trip. What can I do with my stairs?  Do not leave any items on the stairs.  Make sure that   there are handrails on both sides of the stairs and use them. Fix handrails that are broken or loose. Make sure that handrails are as long as the stairways.  Check any carpeting to make sure that it is firmly attached to the stairs. Fix any carpet that is loose or worn.  Avoid having throw rugs at the top or bottom of the stairs. If you do have throw rugs, attach them to the floor with carpet tape.  Make sure that you have a light switch at the top of the stairs and the bottom of the stairs. If you do not have them, ask someone to add them for you. What else can I do to help prevent falls?  Wear shoes that:  Do not have high heels.  Have rubber bottoms.  Are comfortable and fit you well.  Are closed at the toe. Do not wear sandals.  If you use a stepladder:  Make sure that it is fully opened. Do not climb a closed stepladder.  Make sure that both sides of the stepladder are locked into place.  Ask someone to hold it for you, if possible.  Clearly mark and make  sure that you can see:  Any grab bars or handrails.  First and last steps.  Where the edge of each step is.  Use tools that help you move around (mobility aids) if they are needed. These include:  Canes.  Walkers.  Scooters.  Crutches.  Turn on the lights when you go into a dark area. Replace any light bulbs as soon as they burn out.  Set up your furniture so you have a clear path. Avoid moving your furniture around.  If any of your floors are uneven, fix them.  If there are any pets around you, be aware of where they are.  Review your medicines with your doctor. Some medicines can make you feel dizzy. This can increase your chance of falling. Ask your doctor what other things that you can do to help prevent falls. This information is not intended to replace advice given to you by your health care provider. Make sure you discuss any questions you have with your health care provider. Document Released: 03/24/2009 Document Revised: 11/03/2015 Document Reviewed: 07/02/2014 Elsevier Interactive Patient Education  2017 [employer].

## 2017-08-07 NOTE — Progress Notes (Signed)
Subjective:   Justin Orozco is a 84 y.o. male who presents for Medicare Annual/Subsequent preventive examination at [facility]    Objective:    Vitals: BP 110/60 (BP Location: Right Arm, Patient Position: Supine)   Pulse 83   Temp (!) 87.9 F (31.1 C) (Oral)   Ht 5\' 5"  (1.651 m)   Wt 107 lb (48.5 kg)   SpO2 96%   BMI 17.81 kg/m   Body mass index is 17.81 kg/m.  Advanced Directives 08/07/2017 07/10/2017 12/06/2015 12/06/2015 05/31/2014 08/20/2013  Does Patient Have a Medical Advance Directive? Yes Yes No No Yes Patient has advance directive, copy not in chart  Type of Advance Directive [place];Living will Living will;Healthcare Power of [place] will [place];Living will  Does patient want to make changes to medical advance directive? No - Patient declined No - Patient declined - - No - Patient declined No change requested  Copy of [place] in Chart? Yes Yes - - No - copy requested Copy requested from family  Would patient like information on creating a medical advance directive? - - No - patient declined information No - patient declined information - -    Tobacco Social History   Tobacco Use  Smoking Status Passive Smoke Exposure - Never Smoker  Smokeless Tobacco Never Used     Counseling given: Not Answered   Clinical Intake:  Pre-visit preparation completed: No  Pain : No/denies pain     Nutritional Risks: None Diabetes: No  How often do you need to have someone help you when you read instructions, pamphlets, or other written materials from your doctor or pharmacy?: 2 - Rarely What is the last grade level you completed in school?: College  Interpreter Needed?: No  Information entered by :: Tyson Dense, RN  Past Medical History:  Diagnosis Date  . Arthritis   . BPH (benign prostatic hyperplasia)   . CHF (congestive heart failure) ([place])   . Complete heart block ([place])  08/2013   s/p STJ dual chamber pacemaker implant  . Paroxysmal atrial flutter ([place])   . Prostate cancer [facility])    radiation   Past Surgical History:  Procedure Laterality Date  . ANTERIOR CRUCIATE LIGAMENT REPAIR Right ~ 1950  . HIP ARTHROPLASTY Left 06/01/2014   Procedure: LEFT HIP HEMI ARTHROPLASTY ;  Surgeon: Renette Butters, MD;  Location: [facility];  Service: Orthopedics;  Laterality: Left;  Requesting Larkin Ina or April RNFA  . NM MYOCAR PERF WALL MOTION  07/2010   bruce myoview - perfusion defect in basal-apical inferior region (more at rest than stress) - non-gated (A-Flutter), low risk  . PERMANENT PACEMAKER INSERTION N/A 08/21/2013   STJ dual chamber PPM implanted for CHB  . TONSILLECTOMY  ~ 1935  . TOTAL HIP ARTHROPLASTY Right 08/2010   Dr. French Ana  . TRANSTHORACIC ECHOCARDIOGRAM  08/2010   EF 55-60%, mod conc hypertrophy, grade 1 diastolic dysfunction, AV sclerosis, mild regurg; RA mildly dilated   No family history on file. Social History   Socioeconomic History  . Marital status: Widowed    Spouse name: None  . Number of children: 2  . Years of education: None  . Highest education level: None  Social Needs  . Financial resource strain: Not hard at all  . Food insecurity - worry: Never true  . Food insecurity - inability: Never true  . Transportation needs - medical: No  . Transportation needs - non-medical:  No  Occupational History  . None  Tobacco Use  . Smoking status: Passive Smoke Exposure - Never Smoker  . Smokeless tobacco: Never Used  Substance and Sexual Activity  . Alcohol use: Yes    Alcohol/week: 2.3 oz    Types: 3 Glasses of wine, 1 Standard drinks or equivalent per week  . Drug use: No  . Sexual activity: No  Other Topics Concern  . None  Social History Narrative  . None    Outpatient Encounter Medications as of 08/07/2017  Medication Sig  . donepezil (ARICEPT) 10 MG tablet Take 10 mg by mouth at bedtime.   . hydrochlorothiazide (MICROZIDE) 12.5  MG capsule TAKE 1 CAPSULE BY MOUTH DAILY  . HYDROcodone-acetaminophen (NORCO) 7.5-325 MG tablet Take 1 tablet by mouth 3 (three) times daily as needed.   Marland Kitchen LORazepam (ATIVAN) 0.5 MG tablet Take 0.5 mg by mouth every 6 (six) hours as needed for anxiety.   . Multiple Vitamin (MULTIVITAMIN) tablet Take 1 tablet by mouth daily.  . potassium chloride (K-DUR,KLOR-CON) 10 MEQ tablet TAKE 1 TABLET BY MOUTH EVERY DAY  . sertraline (ZOLOFT) 50 MG tablet Take 50 mg by mouth at bedtime.   Marland Kitchen acetaminophen (TYLENOL) 325 MG tablet Take 650 mg by mouth 3 (three) times daily as needed.   No facility-administered encounter medications on file as of 08/07/2017.     Activities of Daily Living In your present state of health, do you have any difficulty performing the following activities: 08/07/2017  Hearing? N  Vision? N  Difficulty concentrating or making decisions? Y  Walking or climbing stairs? Y  Dressing or bathing? Y  Doing errands, shopping? Y  Preparing Food and eating ? Y  Using the Toilet? Y  In the past six months, have you accidently leaked urine? Y  Do you have problems with loss of bowel control? Y  Managing your Medications? Y  Managing your Finances? Y  Housekeeping or managing your Housekeeping? Y  Some recent data might be hidden    Patient Care Team: Gayland Curry, DO as PCP - General (Geriatric Medicine)   Assessment:   This is a routine wellness examination for Justin Orozco.  Exercise Activities and Dietary recommendations Current Exercise Habits: The patient does not participate in regular exercise at present, Exercise limited by: None identified  Goals    None      Fall Risk Fall Risk  08/07/2017 07/10/2017  Falls in the past year? No No   Is the patient's home free of loose throw rugs in walkways, pet beds, electrical cords, etc?   yes      Grab bars in the bathroom? yes      Handrails on the stairs?   yes      Adequate lighting?   yes  Depression Screen PHQ 2/9  Scores 08/07/2017 07/10/2017  PHQ - 2 Score 0 0    Cognitive Function MMSE - Mini Mental State Exam 08/07/2017  Orientation to time 1  Orientation to Place 4  Registration 3  Attention/ Calculation 5  Recall 0  Language- name 2 objects 2  Language- repeat 1  Language- follow 3 step command 3  Language- read & follow direction 1  Write a sentence 1  Copy design 1  Total score 22        Immunization History  Administered Date(s) Administered  . Influenza-Unspecified 03/08/2010, 04/23/2011, 02/21/2012, 03/27/2013, 02/09/2014, 02/26/2014  . Pneumococcal Conjugate-13 03/09/2015  . Pneumococcal Polysaccharide-23 07/11/2009, 07/16/2012  . Td 07/11/2009  .  Tdap 06/15/2017  . Zoster 06/28/2009    Qualifies for Shingles Vaccine? Yes, not in past records  Screening Tests Health Maintenance  Topic Date Due  . INFLUENZA VACCINE  01/09/2017  . TETANUS/TDAP  06/16/2027  . PNA vac Low Risk Adult  Completed   Cancer Screenings: Lung: Low Dose CT Chest recommended if Age 40-80 years, 30 pack-year currently smoking OR have quit w/in 15years. Patient does not qualify. Colorectal: up to date  Additional Screenings:  Hepatitis C Screening: declined Flu vaccine due and orderd    Plan:    I have personally reviewed and addressed the Medicare Annual Wellness questionnaire and have noted the following in the patient's chart:  A. Medical and social history B. Use of alcohol, tobacco or illicit drugs  C. Current medications and supplements D. Functional ability and status E.  Nutritional status F.  Physical activity G. Advance directives H. List of other physicians I.  Hospitalizations, surgeries, and ER visits in previous 12 months J.  [place] to include hearing, vision, cognitive, depression L. Referrals and appointments - none  In addition, I have reviewed and discussed with patient certain preventive protocols, quality metrics, and best practice recommendations. A  written personalized care plan for preventive services as well as general preventive health recommendations were provided to patient.  See attached scanned questionnaire for additional information.   Signed,   Tyson Dense, RN Nurse Health Advisor  Patient Concerns:None

## 2017-08-08 DIAGNOSIS — I5089 Other heart failure: Secondary | ICD-10-CM | POA: Diagnosis not present

## 2017-08-08 DIAGNOSIS — M6389 Disorders of muscle in diseases classified elsewhere, multiple sites: Secondary | ICD-10-CM | POA: Diagnosis not present

## 2017-08-08 DIAGNOSIS — M545 Low back pain: Secondary | ICD-10-CM | POA: Diagnosis not present

## 2017-08-08 DIAGNOSIS — R278 Other lack of coordination: Secondary | ICD-10-CM | POA: Diagnosis not present

## 2017-08-09 DIAGNOSIS — M6389 Disorders of muscle in diseases classified elsewhere, multiple sites: Secondary | ICD-10-CM | POA: Diagnosis not present

## 2017-08-09 DIAGNOSIS — R278 Other lack of coordination: Secondary | ICD-10-CM | POA: Diagnosis not present

## 2017-08-09 DIAGNOSIS — I5089 Other heart failure: Secondary | ICD-10-CM | POA: Diagnosis not present

## 2017-08-09 DIAGNOSIS — M545 Low back pain: Secondary | ICD-10-CM | POA: Diagnosis not present

## 2017-08-12 DIAGNOSIS — M25551 Pain in right hip: Secondary | ICD-10-CM | POA: Diagnosis not present

## 2017-08-13 DIAGNOSIS — R278 Other lack of coordination: Secondary | ICD-10-CM | POA: Diagnosis not present

## 2017-08-13 DIAGNOSIS — M6389 Disorders of muscle in diseases classified elsewhere, multiple sites: Secondary | ICD-10-CM | POA: Diagnosis not present

## 2017-08-13 DIAGNOSIS — M545 Low back pain: Secondary | ICD-10-CM | POA: Diagnosis not present

## 2017-08-13 DIAGNOSIS — I5089 Other heart failure: Secondary | ICD-10-CM | POA: Diagnosis not present

## 2017-08-14 ENCOUNTER — Other Ambulatory Visit: Payer: Self-pay | Admitting: Internal Medicine

## 2017-08-14 ENCOUNTER — Ambulatory Visit
Admission: RE | Admit: 2017-08-14 | Discharge: 2017-08-14 | Disposition: A | Discharge status: Home / Self Care | Payer: Medicare Other | Source: Ambulatory Visit | Attending: Internal Medicine | Admitting: Internal Medicine

## 2017-08-14 DIAGNOSIS — W19XXXA Unspecified fall, initial encounter: Secondary | ICD-10-CM

## 2017-08-14 DIAGNOSIS — R296 Repeated falls: Secondary | ICD-10-CM | POA: Diagnosis not present

## 2017-08-15 ENCOUNTER — Encounter: Payer: Self-pay | Admitting: *Deleted

## 2017-08-16 DIAGNOSIS — M545 Low back pain: Secondary | ICD-10-CM | POA: Diagnosis not present

## 2017-08-16 DIAGNOSIS — R278 Other lack of coordination: Secondary | ICD-10-CM | POA: Diagnosis not present

## 2017-08-16 DIAGNOSIS — I5089 Other heart failure: Secondary | ICD-10-CM | POA: Diagnosis not present

## 2017-08-16 DIAGNOSIS — M6389 Disorders of muscle in diseases classified elsewhere, multiple sites: Secondary | ICD-10-CM | POA: Diagnosis not present

## 2017-08-23 DIAGNOSIS — I5089 Other heart failure: Secondary | ICD-10-CM | POA: Diagnosis not present

## 2017-08-23 DIAGNOSIS — M6389 Disorders of muscle in diseases classified elsewhere, multiple sites: Secondary | ICD-10-CM | POA: Diagnosis not present

## 2017-08-23 DIAGNOSIS — R278 Other lack of coordination: Secondary | ICD-10-CM | POA: Diagnosis not present

## 2017-08-23 DIAGNOSIS — M545 Low back pain: Secondary | ICD-10-CM | POA: Diagnosis not present

## 2017-08-26 NOTE — Progress Notes (Deleted)
Electrophysiology Office Note Date: 08/26/2017  ID:  Justin Orozco, DOB 11-Feb-1927, MRN [id number]  PCP: Justin Curry, DO Primary Cardiologist: Justin Orozco Electrophysiologist: Allred  CC: Pacemaker follow-up  Justin Orozco is a 84 y.o. male seen today for Dr Justin Orozco.  He presents today for routine electrophysiology followup.  Since last being seen in our clinic, the patient reports doing reasonably well.  He denies chest pain, palpitations, dyspnea, PND, orthopnea, nausea, vomiting, dizziness, syncope.  He is now living at [facility] in [place].   Echo 08/2013 demonstrated EF 82-99%, grade 1 diastolic dysfunction, mild MR.  Device History: STj dual chamber PPM implanted 2015 for complete heart block   Past Medical History:  Diagnosis Date  . Arthritis   . BPH (benign prostatic hyperplasia)   . CHF (congestive heart failure) (Justin Orozco)   . Complete heart block (Justin Orozco) 08/2013   s/p STJ dual chamber pacemaker implant  . Paroxysmal atrial flutter (Justin Orozco)   . Prostate cancer Justin Orozco)    radiation   Past Surgical History:  Procedure Laterality Date  . ANTERIOR CRUCIATE LIGAMENT REPAIR Right ~ 1950  . HIP ARTHROPLASTY Left 06/01/2014   Procedure: LEFT HIP HEMI ARTHROPLASTY ;  Surgeon: Justin Butters, MD;  Location: [facility];  Service: Orthopedics;  Laterality: Left;  Requesting Larkin Ina or April RNFA  . NM MYOCAR PERF WALL MOTION  07/2010   bruce myoview - perfusion defect in basal-apical inferior region (more at rest than stress) - non-gated (A-Flutter), low risk  . PERMANENT PACEMAKER INSERTION N/A 08/21/2013   STJ dual chamber PPM implanted for CHB  . TONSILLECTOMY  ~ 1935  . TOTAL HIP ARTHROPLASTY Right 08/2010   Dr. French Ana  . TRANSTHORACIC ECHOCARDIOGRAM  08/2010   EF 55-60%, mod conc hypertrophy, grade 1 diastolic dysfunction, AV sclerosis, mild regurg; RA mildly dilated    Current Outpatient Medications  Medication Sig Dispense Refill  . acetaminophen (TYLENOL) 325 MG  tablet Take 650 mg by mouth 3 (three) times daily as needed.    . donepezil (ARICEPT) 10 MG tablet Take 10 mg by mouth at bedtime.     . hydrochlorothiazide (MICROZIDE) 12.5 MG capsule TAKE 1 CAPSULE BY MOUTH DAILY 30 capsule 0  . HYDROcodone-acetaminophen (NORCO) 7.5-325 MG tablet Take 1 tablet by mouth 3 (three) times daily as needed.     Marland Kitchen LORazepam (ATIVAN) 0.5 MG tablet Take 0.5 mg by mouth every 6 (six) hours as needed for anxiety.     . Multiple Vitamin (MULTIVITAMIN) tablet Take 1 tablet by mouth daily.    . potassium chloride (K-DUR,KLOR-CON) 10 MEQ tablet TAKE 1 TABLET BY MOUTH EVERY DAY 30 tablet 0  . sertraline (ZOLOFT) 50 MG tablet Take 50 mg by mouth at bedtime.      No current facility-administered medications for this visit.     Allergies:   Patient has no known allergies.   Social History: Social History   Socioeconomic History  . Marital status: Widowed    Spouse name: Not on file  . Number of children: 2  . Years of education: Not on file  . Highest education level: Not on file  Social Needs  . Financial resource strain: Not hard at all  . Food insecurity - worry: Never true  . Food insecurity - inability: Never true  . Transportation needs - medical: No  . Transportation needs - non-medical: No  Occupational History  . Not on file  Tobacco Use  . Smoking status: Passive Smoke Exposure -  Never Smoker  . Smokeless tobacco: Never Used  Substance and Sexual Activity  . Alcohol use: Yes    Alcohol/week: 2.3 oz    Types: 3 Glasses of wine, 1 Standard drinks or equivalent per week  . Drug use: No  . Sexual activity: No  Other Topics Concern  . Not on file  Social History Narrative  . Not on file    Review of Systems: All other systems reviewed and are otherwise negative except as noted above.   Physical Exam: VS:  There were no vitals taken for this visit. , BMI There is no height or weight on file to calculate BMI.  GEN- The patient is elderly  appearing, alert and oriented x 3 today.   HEENT: normocephalic, atraumatic; sclera clear, conjunctiva pink; hearing intact; oropharynx clear; neck supple  Lungs- Clear to ausculation bilaterally, normal work of breathing.  No wheezes, rales, rhonchi Heart- Regular rate and rhythm  GI- soft, non-tender, non-distended, bowel sounds present  Extremities- no clubbing, cyanosis, or edema; DP/PT/radial pulses 2+ bilaterally MS- no significant deformity or atrophy Skin- warm and dry, no rash or lesion; PPM pocket well healed Psych- euthymic mood, full affect Neuro- strength and sensation are intact  PPM Interrogation- reviewed in detail today,  See PACEART report  EKG:  EKG is ordered today. EKG today demonstrates SR w V pacing, rate 68  Recent Labs: 07/11/2017: ALT 11; BUN 37; Creatinine 1.3; Hemoglobin 11.8; Platelets 231; Potassium 3.7; Sodium 143   Wt Readings from Last 3 Encounters:  08/07/17 107 lb (48.5 kg)  07/10/17 107 lb (48.5 kg)  12/07/15 125 lb 12.8 oz (57.1 kg)     Other studies Reviewed: Additional studies/ records that were reviewed today include: Dr Justin Orozco office notes, last echo  Assessment and Plan:  1.  Complete heart block Normal PPM function See Pace Art report No changes today  2.  Atrial flutter Continues to be asymptomatic Episodes are all < 20 minutes CHADS2VASC is 3.  If he has increased atrial arrhythmia burden, may need to consider [place].    Current medicines are reviewed at length with the patient today.   The patient does not have concerns regarding his medicines.  The following changes were made today:  none  Labs/ tests ordered today include: none   Disposition:   Follow up with Dr Justin Orozco in 1 year. The patient continues to decline remote monitoring  Signed, Chanetta Marshall, NP 08/26/2017 4:17 PM  [place]  [place] [address] [phone] (office) [phone] (fax)

## 2017-08-28 ENCOUNTER — Encounter: Payer: Medicare Other | Admitting: Nurse Practitioner

## 2017-08-30 DIAGNOSIS — M545 Low back pain: Secondary | ICD-10-CM | POA: Diagnosis not present

## 2017-08-30 DIAGNOSIS — I5089 Other heart failure: Secondary | ICD-10-CM | POA: Diagnosis not present

## 2017-08-30 DIAGNOSIS — M6389 Disorders of muscle in diseases classified elsewhere, multiple sites: Secondary | ICD-10-CM | POA: Diagnosis not present

## 2017-08-30 DIAGNOSIS — R278 Other lack of coordination: Secondary | ICD-10-CM | POA: Diagnosis not present

## 2017-09-17 ENCOUNTER — Non-Acute Institutional Stay (SKILLED_NURSING_FACILITY): Payer: Medicare Other | Admitting: Internal Medicine

## 2017-09-17 ENCOUNTER — Encounter: Payer: Self-pay | Admitting: Internal Medicine

## 2017-09-17 DIAGNOSIS — R634 Abnormal weight loss: Secondary | ICD-10-CM | POA: Diagnosis not present

## 2017-09-17 DIAGNOSIS — I4892 Unspecified atrial flutter: Secondary | ICD-10-CM | POA: Diagnosis not present

## 2017-09-17 DIAGNOSIS — M8448XA Pathological fracture, other site, initial encounter for fracture: Secondary | ICD-10-CM | POA: Diagnosis not present

## 2017-09-17 DIAGNOSIS — F01518 Vascular dementia, unspecified severity, with other behavioral disturbance: Secondary | ICD-10-CM

## 2017-09-17 DIAGNOSIS — M810 Age-related osteoporosis without current pathological fracture: Secondary | ICD-10-CM

## 2017-09-17 DIAGNOSIS — F0151 Vascular dementia with behavioral disturbance: Secondary | ICD-10-CM

## 2017-09-17 DIAGNOSIS — E43 Unspecified severe protein-calorie malnutrition: Secondary | ICD-10-CM

## 2017-09-17 DIAGNOSIS — I442 Atrioventricular block, complete: Secondary | ICD-10-CM

## 2017-09-17 DIAGNOSIS — Z950 Presence of cardiac pacemaker: Secondary | ICD-10-CM | POA: Diagnosis not present

## 2017-09-17 DIAGNOSIS — I5042 Chronic combined systolic (congestive) and diastolic (congestive) heart failure: Secondary | ICD-10-CM

## 2017-09-17 NOTE — Progress Notes (Signed)
Patient ID: Justin Orozco, male   DOB: 01/24/1927, 84 y.o.   MRN: [id number]  Provider:  Rexene Edison. Mariea Clonts, D.O., C.M.D. Location:  [facility] Room Number: 767 Place of Service:  SNF (31)  PCP: Gayland Curry, DO Patient Care Team: Gayland Curry, DO as PCP - General (Geriatric Medicine)  Extended Emergency Contact Information Primary Emergency Contact: Hanf,Fred Address: [address]          [place] [address] [name] of [place] Phone: [phone] Mobile Phone: [phone] Relation: Son Secondary Emergency Contact: Wall,Susie Address: [address]          [place], [place] [address] [name] of [employer] Phone: [phone] Relation: Daughter  Code Status: FULL CODE Goals of Care: Advanced Directive information Advanced Directives 09/17/2017  Does Patient Have a Medical Advance Directive? Yes  Type of Paramedic of Reader;Living will;Out of facility DNR (pink MOST or yellow form)  Does patient want to make changes to medical advance directive? No - Patient declined  Copy of Corinne in Chart? Yes  Would patient like information on creating a medical advance directive? -  Pre-existing out of facility DNR order (yellow form or pink MOST form) Yellow form placed in chart (order not valid for inpatient use)      Chief Complaint  Patient presents with  . New Admit To SNF    moved from Assisted living to Skilled    HPI: Patient is a 84 y.o. male with h/o prostate cancer s/p radiation therapy, p atrial flutter, complete heart block s/p dual chamber pacemaker in 3/15, BPH, CHF (combined), osteoporosis (prior left hip fx), CKD, and arthritis seen today for admission to SNF from assisted living.  He has required increased assistance with his ADL care and has become increasingly frail and cachectic.  When I saw him at the end of January to establish, he had recently moved from [place]  to [place].  He has had some back pain chronically.  He also was losing weight with poor po intake.  His weight at the time of the jan visit was 107 and today he is 106 so he has not lost much more since his move to a more supervised area.  He reports he does eat but not the portions offered here.  His cognitive status also continues to decline due to some vascular dementia.  He is on aricept.  He had been very active in starting [facility] and likes to talk about this.  He started off the visit making sarcastic remarks to my questions, but did come around and become very pleasant.  He then seemed to be enjoying stimulating conversation after a bit.  Staff report he refuses to go to the dining area in SNF.  He stays in his room and will go to the main dining room with family or friends visiting.  He has historically eaten better in company of others.  His back pain is tolerable while using his pillows behind him in his recliner and dining chair in his room.  He uses norco up to three times a day as needed for severe pain.  Prior to his transfer to [place], he'd been drinking considerable amounts of alcohol, but this has since abated.  His spirits have improved with addition of zoloft 50mg  at hs.  He also has prn ativan for anxiety with his care.    For his chf, he takes only hctz and potassium initiated  by prior providers. Unclear why the hctz was chosen.  His fluid intake is minimal.    He also had pressure injury to his coccyx, stage 2 and an unstageable ulcer to his right heel from immobility in his [place] environment.    He had a couple of falls in early march in [place] also.  Hip xrays and pelvis portably and at [facility] were negative for fractures.  Denies the right hip pain now.    Past Medical History:  Diagnosis Date  . Arthritis   . BPH (benign prostatic hyperplasia)   . CHF (congestive heart failure) ([place])   . Complete heart block ([place]) 08/2013   s/p STJ dual chamber pacemaker implant  . Paroxysmal  atrial flutter ([place])   . Prostate cancer [facility])    radiation   Past Surgical History:  Procedure Laterality Date  . ANTERIOR CRUCIATE LIGAMENT REPAIR Right ~ 1950  . HIP ARTHROPLASTY Left 06/01/2014   Procedure: LEFT HIP HEMI ARTHROPLASTY ;  Surgeon: Renette Butters, MD;  Location[facility];  Service: Orthopedics;  Laterality: Left;  Requesting Larkin Ina or April RNFA  . NM MYOCAR PERF WALL MOTION  07/2010   bruce myoview - perfusion defect in basal-apical inferior region (more at rest than stress) - non-gated (A-Flutter), low risk  . PERMANENT PACEMAKER INSERTION N/A 08/21/2013   STJ dual chamber PPM implanted for CHB  . TONSILLECTOMY  ~ 1935  . TOTAL HIP ARTHROPLASTY Right 08/2010   Dr. French Ana  . TRANSTHORACIC ECHOCARDIOGRAM  08/2010   EF 55-60%, mod conc hypertrophy, grade 1 diastolic dysfunction, AV sclerosis, mild regurg; RA mildly dilated    reports that he is a non-smoker but has been exposed to tobacco smoke. He has never used smokeless tobacco. He reports that he drinks about 2.3 oz of alcohol per week. He reports that he does not use drugs. Social History   Socioeconomic History  . Marital status: Widowed    Spouse name: Not on file  . Number of children: 2  . Years of education: Not on file  . Highest education level: Not on file  Occupational History  . Not on file  Social Needs  . Financial resource strain: Not hard at all  . Food insecurity:    Worry: Never true    Inability: Never true  . Transportation needs:    Medical: No    Non-medical: No  Tobacco Use  . Smoking status: Passive Smoke Exposure - Never Smoker  . Smokeless tobacco: Never Used  Substance and Sexual Activity  . Alcohol use: Yes    Alcohol/week: 2.3 oz    Types: 3 Glasses of wine, 1 Standard drinks or equivalent per week  . Drug use: No  . Sexual activity: Never  Lifestyle  . Physical activity:    Days per week: 0 days    Minutes per session: 0 min  . Stress: Not at all  Relationships  .  Social connections:    Talks on phone: More than three times a week    Gets together: More than three times a week    Attends religious service: Never    Active member of club or organization: No    Attends meetings of clubs or organizations: Never    Relationship status: Widowed  . Intimate partner violence:    Fear of current or ex partner: No    Emotionally abused: No    Physically abused: No    Forced sexual activity: No  Other Topics  Concern  . Not on file  Social History Narrative  . Not on file    Functional Status Survey:  needs assistance with adls except eating, but needs encouragement with it, also  No family history on file.  Health Maintenance  Topic Date Due  . INFLUENZA VACCINE  01/09/2018  . TETANUS/TDAP  06/16/2027  . PNA vac Low Risk Adult  Completed    No Known Allergies  Outpatient Encounter Medications as of 09/17/2017  Medication Sig  . acetaminophen (TYLENOL) 325 MG tablet Take 650 mg by mouth 3 (three) times daily as needed.  . donepezil (ARICEPT) 10 MG tablet Take 10 mg by mouth at bedtime.   . hydrochlorothiazide (MICROZIDE) 12.5 MG capsule TAKE 1 CAPSULE BY MOUTH DAILY  . HYDROcodone-acetaminophen (NORCO) 7.5-325 MG tablet Take 1 tablet by mouth 3 (three) times daily as needed.   Marland Kitchen LORazepam (ATIVAN) 0.5 MG tablet Take 0.5 mg by mouth every 6 (six) hours as needed for anxiety.   . Multiple Vitamin (MULTIVITAMIN) tablet Take 1 tablet by mouth daily.  . potassium chloride (K-DUR,KLOR-CON) 10 MEQ tablet TAKE 1 TABLET BY MOUTH EVERY DAY  . sertraline (ZOLOFT) 50 MG tablet Take 50 mg by mouth at bedtime.    No facility-administered encounter medications on file as of 09/17/2017.     Review of Systems  Constitutional: Positive for appetite change, fatigue and unexpected weight change. Negative for activity change, chills and fever.  HENT: Positive for hearing loss. Negative for congestion.   Eyes: Negative for visual disturbance.       Glasses    Respiratory: Negative for chest tightness and shortness of breath.   Cardiovascular: Negative for chest pain, palpitations and leg swelling.  Gastrointestinal: Negative for abdominal pain, blood in stool, constipation and nausea.  Genitourinary: Negative for dysuria.       Some incontinence  Musculoskeletal: Positive for back pain and gait problem. Negative for arthralgias.  Neurological: Positive for weakness. Negative for dizziness.  Hematological: Bruises/bleeds easily.  Psychiatric/Behavioral: Positive for agitation, behavioral problems and confusion. Negative for sleep disturbance. The patient is nervous/anxious.     Vitals:   09/17/17 1213  BP: 126/73  Pulse: 78  Resp: (!) 22  Temp: 97.9 F (36.6 C)  TempSrc: Oral  SpO2: 97%  Weight: 106 lb (48.1 kg)  Height: 5\' 8"  (1.727 m)   Body mass index is 16.12 kg/m. Physical Exam  Constitutional: No distress.  Cachectic white male  HENT:  Head: Normocephalic and atraumatic.  Right Ear: External ear normal.  Left Ear: External ear normal.  Nose: Nose normal.  Mouth/Throat: Oropharynx is clear and moist. No oropharyngeal exudate.  Eyes: Pupils are equal, round, and reactive to light. Conjunctivae and EOM are normal.  glasses  Neck: Neck supple. No JVD present.  Cardiovascular: Normal rate, regular rhythm and intact distal pulses.  Murmur heard. Pulmonary/Chest: Effort normal and breath sounds normal. No respiratory distress. He has no wheezes.  Abdominal: Soft. Bowel sounds are normal. He exhibits no distension. There is no tenderness.  Musculoskeletal: Normal range of motion.  Kyphotic posture, tender over lower back  Lymphadenopathy:    He has no cervical adenopathy.  Neurological: He is alert. No cranial nerve deficit.  Oriented to person and general place, not time  Skin: Skin is warm and dry.  Psychiatric:  Spirits improved over visit    Labs reviewed: Basic Metabolic Panel: Recent Labs    07/11/17  NA 143   K 3.7  BUN 37*  CREATININE  1.3   Liver Function Tests: Recent Labs    07/11/17  AST 19  ALT 11  ALKPHOS 70   No results for input(s): LIPASE, AMYLASE in the last 8760 hours. No results for input(s): AMMONIA in the last 8760 hours. CBC: Recent Labs    07/11/17  WBC 10.9  HGB 11.8*  HCT 35*  PLT 231   Cardiac Enzymes: No results for input(s): CKTOTAL, CKMB, CKMBINDEX, TROPONINI in the last 8760 hours. BNP: Invalid input(s): POCBNP Lab Results  Component Value Date   HGBA1C 5.6 12/06/2015   Lab Results  Component Value Date   TSH 3.810 08/20/2013   Lab Results  Component Value Date   DDUKGURK27 062 07/17/2017   No results found for: FOLATE Lab Results  Component Value Date   IRON 59 07/17/2017   FERRITIN 300.90 07/17/2017    Imaging and Procedures obtained prior to SNF admission: Dg Hip Unilat With Pelvis 2-3 Views Left  Result Date: 08/15/2017 CLINICAL DATA:  Bilateral hip pain after falls. EXAM: DG HIP (WITH OR WITHOUT PELVIS) 2-3V LEFT COMPARISON:  None. FINDINGS: Status post left hip arthroplasty. Prosthesis appears to be well situated. No fracture or dislocation is noted. IMPRESSION: No acute abnormality seen in the left hip. Electronically Signed   By: Marijo Conception, M.D.   On: 08/15/2017 08:21   Dg Hip Unilat With Pelvis 2-3 Views Right  Result Date: 08/15/2017 CLINICAL DATA:  Bilateral hip pain after falls. EXAM: DG HIP (WITH OR WITHOUT PELVIS) 2-3V RIGHT COMPARISON:  None. FINDINGS: Status post bilateral hip arthroplasties. Prosthesis is well situated. No fracture or dislocation is noted. Vascular calcifications are noted. IMPRESSION: No acute abnormality seen in the right hip. Electronically Signed   By: Marijo Conception, M.D.   On: 08/15/2017 08:23    Assessment/Plan 1. Vascular dementia with behavior disturbance -progressing -pt does seem to do better with more people to converse with and eats better in social setting -wonder if aricept could be  contributing to weight loss -hesitant to stop due to his quite sociable state -zoloft and prn ativan have helped him adjust  2. Chronic combined systolic and diastolic congestive heart failure (HCC) -on odd regimen of hctz and potassium  3. Complete heart block (HCC) -s/p pacemaker  4. Paroxysmal atrial flutter (HCC) -s/p pacer  5. S/P placement of cardiac pacemaker -monitored thru cardiology, Dr Rayann Heman and Dr. [name] as primary cardiologist  6. Severe protein-calorie malnutrition ([place]) -only slight progression since move to health care -encourage po intake in social setting where he does better, allow liberal food selections of things he enjoys   7. Weight loss, unintentional -just down 1 lb more since last visit  8. Senile osteoporosis -was not doing well with taking meds when initially moved so opted not to add vitamins, as well -very frail with poor prognosis, as well    9. Pathological fracture of lumbar vertebra, initial encounter And prior hip fx c/w osteoporosis -cont current pain mgt with supportive pillows and norco prn pain   Family/ staff Communication: discussed with SNF nurse  Labs/tests ordered:  No new today  Keyuana Wank L. Olney Monier, D.O. [facility] [address], [place] [address] Cell Phone (Mon-Fri 8am-5pm):  [phone] On Call:  [phone] & follow prompts after 5pm & weekends Office Phone:  [phone] Office Fax:  [phone]

## 2017-09-20 ENCOUNTER — Encounter: Payer: Self-pay | Admitting: Internal Medicine

## 2017-10-09 ENCOUNTER — Encounter: Payer: Self-pay | Admitting: Internal Medicine

## 2017-10-10 ENCOUNTER — Non-Acute Institutional Stay (SKILLED_NURSING_FACILITY): Payer: Medicare Other | Admitting: Adult Health

## 2017-10-10 ENCOUNTER — Encounter: Payer: Self-pay | Admitting: Adult Health

## 2017-10-10 DIAGNOSIS — M810 Age-related osteoporosis without current pathological fracture: Secondary | ICD-10-CM | POA: Diagnosis not present

## 2017-10-10 DIAGNOSIS — F0151 Vascular dementia with behavioral disturbance: Secondary | ICD-10-CM

## 2017-10-10 DIAGNOSIS — F01518 Vascular dementia, unspecified severity, with other behavioral disturbance: Secondary | ICD-10-CM

## 2017-10-10 DIAGNOSIS — I5042 Chronic combined systolic (congestive) and diastolic (congestive) heart failure: Secondary | ICD-10-CM | POA: Diagnosis not present

## 2017-10-10 DIAGNOSIS — E43 Unspecified severe protein-calorie malnutrition: Secondary | ICD-10-CM

## 2017-10-10 DIAGNOSIS — Z7189 Other specified counseling: Secondary | ICD-10-CM | POA: Diagnosis not present

## 2017-10-10 DIAGNOSIS — I442 Atrioventricular block, complete: Secondary | ICD-10-CM

## 2017-10-10 DIAGNOSIS — M8448XG Pathological fracture, other site, subsequent encounter for fracture with delayed healing: Secondary | ICD-10-CM | POA: Diagnosis not present

## 2017-10-10 NOTE — Assessment & Plan Note (Signed)
Due to progressive dementia and poor intake.  Continue boost qd. Monitor weight.  Consider TSH with labs due later in June

## 2017-10-10 NOTE — ACP (Advance Care Planning) (Signed)
[name] has progressive dementia with behaviors, as well as weight loss. He now resides in skilled care due to his declining status. I spoke with his son, Justin Orozco. We discussed a most form. Justin Orozco does not want Mr. Cutbirth to go to the hospital unless his comfort needs can not be met at [facility]. He is a DNR and does not want his life prolonged with IVF or tube feeding. Antibiotics will be given if determined to be appropriate by the provider. His goals of care are comfort based with no heroic measures.

## 2017-10-10 NOTE — Assessment & Plan Note (Addendum)
Due to his advancing regimen he is no longer able to go out for dexa scans and is not a candidate for medication.

## 2017-10-10 NOTE — Progress Notes (Signed)
Location:  Occupational psychologist of Service:  SNF (31) Provider:   Cindi Carbon, ANP [place] [phone]  Gayland Curry, DO  Patient Care Team: Gayland Curry, DO as PCP - General (Geriatric Medicine)  Extended Emergency Contact Information Primary Emergency Contact: [facility] Address: [address]          [place] [address] [name] of [place] Phone: [phone] Mobile Phone: [phone] Relation: Son Secondary Emergency Contact: [facility] Address: [place], [place] [address] [name] Mobile Phone: [phone] Relation: Daughter  Code Status:  DNR Goals of care: Advanced Directive information Advanced Directives 09/17/2017  Does Patient Have a Medical Advance Directive? Yes  Type of Paramedic of [place];Living will;Out of facility DNR (pink MOST or yellow form)  Does patient want to make changes to medical advance directive? No - Patient declined  Copy of [place] in Chart? Yes  Would patient like information on creating a medical advance directive? -  Pre-existing out of facility DNR order (yellow form or pink MOST form) Yellow form placed in chart (order not valid for inpatient use)     Chief Complaint  Patient presents with  . Medical Management of Chronic Issues    HPI:  Pt is a 85 y.o. male seen today for medical management of chronic diseases. He recently moved to skilled care due to functional and cognitive decline. The staff report that he has adjusted quite well. He does not like to leave the room and is not social with others. He can become quite angry if he is asked to leave the room. If he is left in his room he is pleasant. He was taking prn ativan for agitation but this was later discontinued due to non use because his behaviors improved.  He has a hx of OP, lumbar compression fracture and hip fracture. He was previously  on Norco for pain but this was discontinued and he is doing well with scheduled tylenol.  He remains with a low weight. He eats all of his cereal for breakfast, 25-50% of lunch, and very little dinner per the records. He is taking a boost in the afternoon. See weights. Wt Readings from Last 3 Encounters:  10/10/17 105 lb 3.2 oz (47.7 kg)  09/17/17 106 lb (48.1 kg)  08/07/17 107 lb (48.5 kg)    CHF: weights stable to slight downward trend No DOE, SOB, or CP  Has a pacemaker due to complete heart block  Uses a walker for gait stability  Incontinent of bowel and bladder at times  Past Medical History:  Diagnosis Date  . Arthritis   . BPH (benign prostatic hyperplasia)   . CHF (congestive heart failure) ([place])   . Complete heart block ([place]) 08/2013   s/p STJ dual chamber pacemaker implant  . Paroxysmal atrial flutter ([place])   . Prostate cancer [facility])    radiation   Past Surgical History:  Procedure Laterality Date  . ANTERIOR CRUCIATE LIGAMENT REPAIR Right ~ 1950  . HIP ARTHROPLASTY Left 06/01/2014   Procedure: LEFT HIP HEMI ARTHROPLASTY ;  Surgeon: Renette Butters, MD;  Location: [facility];  Service: Orthopedics;  Laterality: Left;  Requesting Larkin Ina or April RNFA  . NM MYOCAR PERF WALL MOTION  07/2010   bruce myoview - perfusion defect in basal-apical inferior region (more at rest than stress) - non-gated (A-Flutter), low risk  .  PERMANENT PACEMAKER INSERTION N/A 08/21/2013   STJ dual chamber PPM implanted for CHB  . TONSILLECTOMY  ~ 1935  . TOTAL HIP ARTHROPLASTY Right 08/2010   Dr. French Ana  . TRANSTHORACIC ECHOCARDIOGRAM  08/2010   EF 55-60%, mod conc hypertrophy, grade 1 diastolic dysfunction, AV sclerosis, mild regurg; RA mildly dilated    No Known Allergies  Outpatient Encounter Medications as of 10/10/2017  Medication Sig  . lactose free nutrition (BOOST) LIQD Take 237 mLs by mouth daily.  . sertraline (ZOLOFT) 100 MG tablet Take 100 mg by mouth daily.  Marland Kitchen acetaminophen  (TYLENOL) 325 MG tablet Take 650 mg by mouth 3 (three) times daily as needed.  . donepezil (ARICEPT) 10 MG tablet Take 10 mg by mouth at bedtime.   . hydrochlorothiazide (MICROZIDE) 12.5 MG capsule TAKE 1 CAPSULE BY MOUTH DAILY  . Multiple Vitamin (MULTIVITAMIN) tablet Take 1 tablet by mouth daily.  . potassium chloride (K-DUR,KLOR-CON) 10 MEQ tablet TAKE 1 TABLET BY MOUTH EVERY DAY  . [DISCONTINUED] HYDROcodone-acetaminophen (NORCO) 7.5-325 MG tablet Take 1 tablet by mouth 3 (three) times daily as needed.   . [DISCONTINUED] LORazepam (ATIVAN) 0.5 MG tablet Take 0.5 mg by mouth every 6 (six) hours as needed for anxiety.   . [DISCONTINUED] sertraline (ZOLOFT) 50 MG tablet Take 50 mg by mouth at bedtime.    No facility-administered encounter medications on file as of 10/10/2017.     Review of Systems  Constitutional: Negative for activity change, appetite change, chills, diaphoresis, fatigue, fever and unexpected weight change.  Respiratory: Negative for cough, shortness of breath, wheezing and stridor.   Cardiovascular: Negative for chest pain, palpitations and leg swelling.  Gastrointestinal: Negative for abdominal distention, abdominal pain, constipation and diarrhea.  Genitourinary: Negative for difficulty urinating and dysuria.  Musculoskeletal: Positive for gait problem. Negative for arthralgias, back pain, joint swelling and myalgias.  Neurological: Negative for dizziness, seizures, syncope, facial asymmetry, speech difficulty, weakness and headaches.  Hematological: Negative for adenopathy. Does not bruise/bleed easily.  Psychiatric/Behavioral: Positive for behavioral problems, confusion and dysphoric mood. Negative for agitation, self-injury, sleep disturbance and suicidal ideas. The patient is not nervous/anxious.     Immunization History  Administered Date(s) Administered  . Influenza-Unspecified 03/08/2010, 04/23/2011, 02/21/2012, 03/27/2013, 02/09/2014, 02/26/2014, 08/08/2017  .  Pneumococcal Conjugate-13 03/09/2015  . Pneumococcal Polysaccharide-23 07/11/2009, 07/16/2012  . Td 07/11/2009  . Tdap 06/15/2017  . Zoster 06/28/2009   Pertinent  Health Maintenance Due  Topic Date Due  . INFLUENZA VACCINE  01/09/2018  . PNA vac Low Risk Adult  Completed   Fall Risk  08/07/2017 07/10/2017  Falls in the past year? No No   Functional Status Survey:    Vitals:   10/10/17 1056  BP: 132/80  Pulse: 79  Temp: 97.7 F (36.5 C)  Weight: 105 lb 3.2 oz (47.7 kg)   Body mass index is 16 kg/m. Physical Exam  Constitutional: No distress.  Thin and frail  HENT:  Head: Normocephalic and atraumatic.  Neck: Normal range of motion. Neck supple. No JVD present. No tracheal deviation present. No thyromegaly present.  Cardiovascular: Normal rate and regular rhythm.  No murmur heard. Pulmonary/Chest: Effort normal and breath sounds normal. No respiratory distress. He has no wheezes.  Pectus excavatum  Abdominal: Soft. Bowel sounds are normal. He exhibits no distension. There is no tenderness.  Musculoskeletal: He exhibits no edema, tenderness or deformity.  Lymphadenopathy:    He has no cervical adenopathy.  Neurological: He is alert.  Oriented to self and place  Able to f/c  Skin: Skin is warm and dry. He is not diaphoretic.  Psychiatric: He has a normal mood and affect.  Nursing note and vitals reviewed.   Labs reviewed: Recent Labs    07/11/17  NA 143  K 3.7  BUN 37*  CREATININE 1.3   Recent Labs    07/11/17  AST 19  ALT 11  ALKPHOS 70   Recent Labs    07/11/17  WBC 10.9  HGB 11.8*  HCT 35*  PLT 231   Lab Results  Component Value Date   TSH 3.810 08/20/2013   Lab Results  Component Value Date   HGBA1C 5.6 12/06/2015   Lab Results  Component Value Date   CHOL 125 12/07/2015   HDL 42 12/07/2015   LDLCALC 75 12/07/2015   TRIG 38 12/07/2015   CHOLHDL 3.0 12/07/2015    Significant Diagnostic Results in last 30 days:  No results  found.  Assessment/Plan  Chronic combined systolic and diastolic congestive heart failure (HCC) Compensated Continue hctz  Complete heart block ([place]) No recent transmission in epic or recent f/u with cards. I have asked the nurse, Larene Beach, to check with the resident and the cardiology for appropriate f/u.    Vascular dementia with behavior disturbance Seems to be adjusting well. Remains able to communicate, ambulate, and feed himself. Weight is concerning. Would continue Aricept at this point but if his weight trends down more we may discontinue it.   Senile osteoporosis Due to his advancing regimen he is no longer able to go out for dexa scans and is not a candidate for medication.  Severe protein-calorie malnutrition ([place]) Due to progressive dementia and poor intake.  Continue boost qd. Monitor weight.  Consider TSH with labs due later in June  Pathologic lumbar vertebral fracture Pain much improved, continue scheduled tylenol.  Advanced care planning Mr. Lovejoy has progressive dementia with behaviors, as well as weight loss. He now resides in skilled care due to his declining status. I spoke with his son, [name]. We discussed a most form. [name] does not want Mr. Lamadrid to go to the hospital unless his comfort needs can not be met at [facility]. He is a DNR and does not want his life prolonged with IVF or tube feeding. Antibiotics will be given if determined to be appropriate by the provider. His goals of care are comfort based with no heroic measures.  Family/ staff Communication: discussed with resident, caretaker, and his son Augustina Mood ordered:  NA

## 2017-10-10 NOTE — Assessment & Plan Note (Addendum)
No recent transmission in epic or recent f/u with cards. I have asked the nurse, Larene Beach, to check with the resident and the cardiology for appropriate f/u.

## 2017-10-10 NOTE — Assessment & Plan Note (Signed)
Pain much improved, continue scheduled tylenol.

## 2017-10-10 NOTE — Assessment & Plan Note (Signed)
Compensated Continue hctz

## 2017-10-10 NOTE — Assessment & Plan Note (Signed)
Seems to be adjusting well. Remains able to communicate, ambulate, and feed himself. Weight is concerning. Would continue Aricept at this point but if his weight trends down more we may discontinue it.

## 2017-10-28 DIAGNOSIS — M545 Low back pain: Secondary | ICD-10-CM | POA: Diagnosis not present

## 2017-11-08 ENCOUNTER — Encounter: Payer: Self-pay | Admitting: Adult Health

## 2017-11-08 ENCOUNTER — Non-Acute Institutional Stay (SKILLED_NURSING_FACILITY): Payer: Medicare Other | Admitting: Adult Health

## 2017-11-08 DIAGNOSIS — F0151 Vascular dementia with behavioral disturbance: Secondary | ICD-10-CM

## 2017-11-08 DIAGNOSIS — M8448XG Pathological fracture, other site, subsequent encounter for fracture with delayed healing: Secondary | ICD-10-CM

## 2017-11-08 DIAGNOSIS — F01518 Vascular dementia, unspecified severity, with other behavioral disturbance: Secondary | ICD-10-CM

## 2017-11-08 DIAGNOSIS — E43 Unspecified severe protein-calorie malnutrition: Secondary | ICD-10-CM | POA: Diagnosis not present

## 2017-11-08 NOTE — Assessment & Plan Note (Addendum)
Pain seems aggravated by his recent fall. He continues on scheduled tylenol. He refuses to try a lidocaine patch.  Schedule Norco 1 tab qd x 7 days and monitor for improvement in pain.  He is not interested in going for dexa scans or additional treatment for his osteoporosis.

## 2017-11-08 NOTE — Assessment & Plan Note (Signed)
?   If his weight would improve off Aricept. It is possible he is receiving benefit given that he is ambulatory and able to communicate. Will discuss with Dr. Mariea Clonts

## 2017-11-08 NOTE — Progress Notes (Signed)
Location:  Occupational psychologist of Service:  SNF (31) Provider:   Cindi Carbon, ANP [place] [phone]   Gayland Curry, DO  Patient Care Team: Gayland Curry, DO as PCP - General (Geriatric Medicine)  Extended Emergency Contact Information Primary Emergency Contact: [facility] Address: [address]          [place] [address] [name] of [place] Phone: [phone] Mobile Phone: [phone] Relation: Son Secondary Emergency Contact: [facility] Address: [place],  [address] [name] Mobile Phone: [phone] Relation: Daughter  Code Status:  DNR Goals of care: Advanced Directive information Advanced Directives 09/17/2017  Does Patient Have a Medical Advance Directive? Yes  Type of Paramedic of [place];Living will;Out of facility DNR (pink MOST or yellow form)  Does patient want to make changes to medical advance directive? No - Patient declined  Copy of [place] in Chart? Yes  Would patient like information on creating a medical advance directive? -  Pre-existing out of facility DNR order (yellow form or pink MOST form) Yellow form placed in chart (order not valid for inpatient use)     Chief Complaint  Patient presents with  . Medical Management of Chronic Issues    HPI:  Pt is a 84 y.o. male seen today for medical management of chronic diseases.   He resides in skilled care due to advancing dementia.   Dementia: 22/30 MMSE in Feb of 2019.  He remains ambulatory and verbal. He is able to follow commands. He can become agitated if asked to leave his room or perform unwanted task but is otherwise doing well. He remains on aricept with low weight at 105 lbs. He eats well at breakfast but much less at lunch and dinner. Currently is taking a boost each afternoon.  Wt Readings from Last 3 Encounters:  11/08/17 105 lb 3.2 oz (47.7  kg)  10/10/17 105 lb 3.2 oz (47.7 kg)  09/17/17 106 lb (48.1 kg)   He fell on 5/20 and an xray of the lumbar spine was taken due to pain which showed the following: Lumbar spine xray 10/28/17: bony structures are osteoporotic, compression fractures noted at L1-L2 and there is narrowing of L5-S1 intervertebral space He was ordered prn Norco but it was not given. His caretaker states that as of today he was in pain with ambulation to the bathroom. He reiterates to me his pain to the lumbar area and right hip over and over again.   Due to his dementia he tends to refuse care and/or meds that will help.  Past Medical History:  Diagnosis Date  . Arthritis   . BPH (benign prostatic hyperplasia)   . CHF (congestive heart failure) ([place])   . Complete heart block ([place]) 08/2013   s/p STJ dual chamber pacemaker implant  . Paroxysmal atrial flutter ([place])   . Prostate cancer [facility])    radiation   Past Surgical History:  Procedure Laterality Date  . ANTERIOR CRUCIATE LIGAMENT REPAIR Right ~ 1950  . HIP ARTHROPLASTY Left 06/01/2014   Procedure: LEFT HIP HEMI ARTHROPLASTY ;  Surgeon: Renette Butters, MD;  Location: [facility];  Service: Orthopedics;  Laterality: Left;  Requesting Larkin Ina or April RNFA  . NM MYOCAR PERF WALL MOTION  07/2010   bruce myoview - perfusion defect in basal-apical inferior region (more at rest than stress) - non-gated (A-Flutter), low  risk  . PERMANENT PACEMAKER INSERTION N/A 08/21/2013   STJ dual chamber PPM implanted for CHB  . TONSILLECTOMY  ~ 1935  . TOTAL HIP ARTHROPLASTY Right 08/2010   Dr. French Ana  . TRANSTHORACIC ECHOCARDIOGRAM  08/2010   EF 55-60%, mod conc hypertrophy, grade 1 diastolic dysfunction, AV sclerosis, mild regurg; RA mildly dilated    No Known Allergies  Outpatient Encounter Medications as of 11/08/2017  Medication Sig  . HYDROcodone-acetaminophen (NORCO/VICODIN) 5-325 MG tablet Take 1 tablet by mouth 2 (two) times daily as needed for moderate pain.  Marland Kitchen  acetaminophen (TYLENOL) 325 MG tablet Take 650 mg by mouth 3 (three) times daily as needed.  . donepezil (ARICEPT) 10 MG tablet Take 10 mg by mouth at bedtime.   . hydrochlorothiazide (MICROZIDE) 12.5 MG capsule TAKE 1 CAPSULE BY MOUTH DAILY  . lactose free nutrition (BOOST) LIQD Take 237 mLs by mouth daily.  . Multiple Vitamin (MULTIVITAMIN) tablet Take 1 tablet by mouth daily.  . potassium chloride (K-DUR,KLOR-CON) 10 MEQ tablet TAKE 1 TABLET BY MOUTH EVERY DAY  . sertraline (ZOLOFT) 100 MG tablet Take 100 mg by mouth daily.   No facility-administered encounter medications on file as of 11/08/2017.     Review of Systems  Constitutional: Negative for activity change, chills, diaphoresis, fatigue and fever.       Chronic low appetite and low weight  Eyes: Negative for visual disturbance.  Respiratory: Negative for cough, shortness of breath, wheezing and stridor.   Cardiovascular: Negative for chest pain, palpitations and leg swelling.  Gastrointestinal: Negative for abdominal distention, abdominal pain, constipation and diarrhea.  Endocrine: Negative for polyphagia and polyuria.  Genitourinary: Negative for difficulty urinating and dysuria.  Musculoskeletal: Positive for arthralgias, back pain and gait problem. Negative for joint swelling and myalgias.  Neurological: Negative for dizziness, seizures, syncope, facial asymmetry, speech difficulty, weakness and headaches.  Hematological: Negative for adenopathy. Does not bruise/bleed easily.  Psychiatric/Behavioral: Positive for agitation, behavioral problems and confusion.    Immunization History  Administered Date(s) Administered  . Influenza-Unspecified 03/08/2010, 04/23/2011, 02/21/2012, 03/27/2013, 02/09/2014, 02/26/2014, 08/08/2017  . Pneumococcal Conjugate-13 03/09/2015  . Pneumococcal Polysaccharide-23 07/11/2009, 07/16/2012  . Td 07/11/2009  . Tdap 06/15/2017  . Zoster 06/28/2009   Pertinent  Health Maintenance Due  Topic  Date Due  . INFLUENZA VACCINE  01/09/2018  . PNA vac Low Risk Adult  Completed   Fall Risk  08/07/2017 07/10/2017  Falls in the past year? No No   Functional Status Survey:    Vitals:   11/08/17 1103  BP: (!) 149/81  Pulse: 63  Resp: 14  Temp: 97.7 F (36.5 C)  SpO2: 97%  Weight: 105 lb 3.2 oz (47.7 kg)   Body mass index is 16 kg/m. Physical Exam  Constitutional: No distress.  HENT:  Head: Normocephalic and atraumatic.  Neck: No JVD present.  Cardiovascular: Normal rate and regular rhythm.  No murmur heard. Pulmonary/Chest: Effort normal and breath sounds normal. No respiratory distress. He has no wheezes.  Abdominal: Soft. Bowel sounds are normal. He exhibits no distension. There is no tenderness.  Musculoskeletal: He exhibits no edema or tenderness.  No tenderness or swelling or erythema to the spine. Kyphosis curvature noted.   Lymphadenopathy:    He has no cervical adenopathy.  Neurological: He is alert.  Oriented to self and place but not time. Able to f/c.    Skin: Skin is warm and dry. He is not diaphoretic.  Psychiatric:  Slightly agitated   Nursing note and  vitals reviewed.   Labs reviewed: Recent Labs    07/11/17  NA 143  K 3.7  BUN 37*  CREATININE 1.3   Recent Labs    07/11/17  AST 19  ALT 11  ALKPHOS 70   Recent Labs    07/11/17  WBC 10.9  HGB 11.8*  HCT 35*  PLT 231   Lab Results  Component Value Date   TSH 3.810 08/20/2013   Lab Results  Component Value Date   HGBA1C 5.6 12/06/2015   Lab Results  Component Value Date   CHOL 125 12/07/2015   HDL 42 12/07/2015   LDLCALC 75 12/07/2015   TRIG 38 12/07/2015   CHOLHDL 3.0 12/07/2015    Significant Diagnostic Results in last 30 days:  No results found.  Assessment/Plan Pathologic lumbar vertebral fracture Pain seems aggravated by his recent fall. He continues on scheduled tylenol. He refuses to try a lidocaine patch.  Schedule Norco 1 tab qd x 7 days and monitor for  improvement in pain.  He is not interested in going for dexa scans or additional treatment for his osteoporosis.   Vascular dementia with behavior disturbance ? If his weight would improve off Aricept. It is possible he is receiving benefit given that he is ambulatory and able to communicate. Will discuss with Dr. Mariea Clonts   Severe protein-calorie malnutrition ([place]) Continue Boost each day. It is difficult to get him to eat extra protein or take additional supplements. Continue to monitor weight and encourage intake with calorie dense foods.     Family/ staff Communication: staff  Labs/tests ordered:  CMP TSH

## 2017-11-08 NOTE — Assessment & Plan Note (Signed)
Continue Boost each day. It is difficult to get him to eat extra protein or take additional supplements. Continue to monitor weight and encourage intake with calorie dense foods.

## 2017-11-11 DIAGNOSIS — N189 Chronic kidney disease, unspecified: Secondary | ICD-10-CM | POA: Diagnosis not present

## 2017-11-11 DIAGNOSIS — E039 Hypothyroidism, unspecified: Secondary | ICD-10-CM | POA: Diagnosis not present

## 2017-11-11 DIAGNOSIS — R634 Abnormal weight loss: Secondary | ICD-10-CM | POA: Diagnosis not present

## 2017-11-11 LAB — BASIC METABOLIC PANEL
BUN: 36 — AB (ref 4–21)
Creatinine: 1.4 — AB (ref 0.6–1.3)
Glucose: 100
POTASSIUM: 4.2 (ref 3.4–5.3)
Sodium: 144 (ref 137–147)

## 2017-11-11 LAB — HEPATIC FUNCTION PANEL
ALK PHOS: 112 (ref 25–125)
ALT: 11 (ref 10–40)
AST: 11 — AB (ref 14–40)
BILIRUBIN, TOTAL: 0.3

## 2017-11-11 LAB — TSH: TSH: 2.9 (ref 0.41–5.90)

## 2017-11-14 ENCOUNTER — Other Ambulatory Visit: Payer: Self-pay | Admitting: Adult Health

## 2017-11-14 MED ORDER — HYDROCODONE-ACETAMINOPHEN 5-325 MG PO TABS
1.0000 | ORAL_TABLET | Freq: Every day | ORAL | 0 refills | Status: AC
Start: 2017-11-14 — End: 2017-12-14

## 2017-12-05 DIAGNOSIS — B351 Tinea unguium: Secondary | ICD-10-CM | POA: Diagnosis not present

## 2017-12-27 ENCOUNTER — Encounter: Payer: Self-pay | Admitting: Cardiology

## 2018-01-16 ENCOUNTER — Non-Acute Institutional Stay (SKILLED_NURSING_FACILITY): Payer: Medicare Other | Admitting: Adult Health

## 2018-01-16 ENCOUNTER — Encounter: Payer: Self-pay | Admitting: Adult Health

## 2018-01-16 DIAGNOSIS — E43 Unspecified severe protein-calorie malnutrition: Secondary | ICD-10-CM

## 2018-01-16 DIAGNOSIS — R634 Abnormal weight loss: Secondary | ICD-10-CM

## 2018-01-16 DIAGNOSIS — F0151 Vascular dementia with behavioral disturbance: Secondary | ICD-10-CM | POA: Diagnosis not present

## 2018-01-16 DIAGNOSIS — I5042 Chronic combined systolic (congestive) and diastolic (congestive) heart failure: Secondary | ICD-10-CM

## 2018-01-16 DIAGNOSIS — F01518 Vascular dementia, unspecified severity, with other behavioral disturbance: Secondary | ICD-10-CM

## 2018-01-16 DIAGNOSIS — M8448XG Pathological fracture, other site, subsequent encounter for fracture with delayed healing: Secondary | ICD-10-CM

## 2018-01-16 DIAGNOSIS — I442 Atrioventricular block, complete: Secondary | ICD-10-CM | POA: Diagnosis not present

## 2018-01-16 NOTE — Progress Notes (Signed)
Location:  Occupational psychologist of Service:  SNF (31) Provider:   Cindi Carbon, ANP [place] [phone]   Gayland Curry, DO  Patient Care Team: Gayland Curry, DO as PCP - General (Geriatric Medicine)  Extended Emergency Contact Information Primary Emergency Contact: [facility] Address: [address]          [place] [address] [name] of [place] Phone: [phone] Mobile Phone: [phone] Relation: Son Secondary Emergency Contact: [facility] Address: [place], [place] [address] [name] Mobile Phone: [phone] Relation: Daughter  Code Status:  DNR Goals of care: Advanced Directive information Advanced Directives 09/17/2017  Does Patient Have a Medical Advance Directive? Yes  Type of Paramedic of [place];Living will;Out of facility DNR (pink MOST or yellow form)  Does patient want to make changes to medical advance directive? No - Patient declined  Copy of [place] in Chart? Yes  Would patient like information on creating a medical advance directive? -  Pre-existing out of facility DNR order (yellow form or pink MOST form) Yellow form placed in chart (order not valid for inpatient use)     Chief Complaint  Patient presents with  . Medical Management of Chronic Issues    HPI:  Pt is a 84 y.o. male seen today for medical management of chronic diseases.    Weight loss: He has lost 8 lbs since May.  Take unjury 1/2 scoop bid. Overall poor intake.   Vascular dementia: 24/30 MMSE 12/28/17. He remains ambulatory and verbal. He is able to follow commands. He continues to have periods of mild agitation and but is re directed easily. He does not come out of his room and prefers to avoid social gatherings.   Lumbar compression fracture with hx of OP: Lumbar spine xray 10/28/17: bony structures are osteoporotic, compression fractures noted  at L1-L2 and there is narrowing of L5-S1 intervertebral space Pain is improved with scheduled Norco  He has a pacemaker due to a hx of complete heart block (no recent remove transmission in epic) Past Medical History:  Diagnosis Date  . Arthritis   . BPH (benign prostatic hyperplasia)   . CHF (congestive heart failure) ([place])   . Complete heart block ([place]) 08/2013   s/p STJ dual chamber pacemaker implant  . Paroxysmal atrial flutter ([place])   . Prostate cancer [facility])    radiation   Past Surgical History:  Procedure Laterality Date  . ANTERIOR CRUCIATE LIGAMENT REPAIR Right ~ 1950  . HIP ARTHROPLASTY Left 06/01/2014   Procedure: LEFT HIP HEMI ARTHROPLASTY ;  Surgeon: Renette Butters, MD;  Location: [facility];  Service: Orthopedics;  Laterality: Left;  Requesting Larkin Ina or April RNFA  . NM MYOCAR PERF WALL MOTION  07/2010   bruce myoview - perfusion defect in basal-apical inferior region (more at rest than stress) - non-gated (A-Flutter), low risk  . PERMANENT PACEMAKER INSERTION N/A 08/21/2013   STJ dual chamber PPM implanted for CHB  . TONSILLECTOMY  ~ 1935  . TOTAL HIP ARTHROPLASTY Right 08/2010   Dr. French Ana  . TRANSTHORACIC ECHOCARDIOGRAM  08/2010   EF 55-60%, mod conc hypertrophy, grade 1 diastolic dysfunction, AV sclerosis, mild regurg; RA mildly dilated    No Known Allergies  Outpatient Encounter Medications as of 01/16/2018  Medication Sig  . acetaminophen (TYLENOL) 325 MG tablet Take 650 mg by mouth 3 (three) times daily.   Marland Kitchen  donepezil (ARICEPT) 10 MG tablet Take 10 mg by mouth at bedtime.   . hydrochlorothiazide (MICROZIDE) 12.5 MG capsule TAKE 1 CAPSULE BY MOUTH DAILY  . lactose free nutrition (BOOST) LIQD Take 237 mLs by mouth daily.  . Multiple Vitamin (MULTIVITAMIN) tablet Take 1 tablet by mouth daily.  . potassium chloride (K-DUR,KLOR-CON) 10 MEQ tablet TAKE 1 TABLET BY MOUTH EVERY DAY  . Protein (UNJURY PO) Take 0.5 Scoops by mouth 2 (two) times daily.  . sertraline  (ZOLOFT) 100 MG tablet Take 100 mg by mouth daily.   No facility-administered encounter medications on file as of 01/16/2018.     Review of Systems  Constitutional: Positive for unexpected weight change. Negative for activity change, appetite change, chills, diaphoresis, fatigue and fever.  Respiratory: Negative for cough, shortness of breath, wheezing and stridor.   Cardiovascular: Negative for chest pain, palpitations and leg swelling.  Gastrointestinal: Negative for abdominal distention, abdominal pain, constipation and diarrhea.  Genitourinary: Negative for difficulty urinating and dysuria.  Musculoskeletal: Positive for back pain and gait problem. Negative for arthralgias, joint swelling and myalgias.  Neurological: Negative for dizziness, seizures, syncope, facial asymmetry, speech difficulty, weakness and headaches.  Hematological: Negative for adenopathy. Does not bruise/bleed easily.  Psychiatric/Behavioral: Positive for agitation, behavioral problems and confusion.    Immunization History  Administered Date(s) Administered  . Influenza-Unspecified 03/08/2010, 04/23/2011, 02/21/2012, 03/27/2013, 02/09/2014, 02/26/2014, 08/08/2017  . Pneumococcal Conjugate-13 03/09/2015  . Pneumococcal Polysaccharide-23 07/11/2009, 07/16/2012  . Td 07/11/2009  . Tdap 06/15/2017  . Zoster 06/28/2009   Pertinent  Health Maintenance Due  Topic Date Due  . INFLUENZA VACCINE  01/09/2018  . PNA vac Low Risk Adult  Completed   Fall Risk  08/07/2017 07/10/2017  Falls in the past year? No No   Functional Status Survey:    Vitals:   01/16/18 1608  BP: 130/72  Pulse: (!) 59  Resp: 14  Temp: 98.1 F (36.7 C)  SpO2: 96%  Weight: 97 lb 6.4 oz (44.2 kg)   Body mass index is 14.81 kg/m. Physical Exam  Constitutional: No distress.  Thin and frail  HENT:  Head: Normocephalic and atraumatic.  Mouth/Throat: No oropharyngeal exudate.  Very dry mouth  Neck: No JVD present. No tracheal deviation  present.  Cardiovascular: Normal rate and regular rhythm.  No murmur heard. No edema  Pulmonary/Chest: Effort normal and breath sounds normal. No respiratory distress. He has no wheezes.  Abdominal: Soft. Bowel sounds are normal. He exhibits no distension. There is no tenderness.  Lymphadenopathy:    He has no cervical adenopathy.  Neurological: He is alert.  Oriented to self and place but not time.  Skin: Skin is warm and dry. He is not diaphoretic.  Poor skin turgor  Psychiatric: He has a normal mood and affect.  Nursing note and vitals reviewed.   Labs reviewed: Recent Labs    07/11/17 11/11/17  NA 143 144  K 3.7 4.2  BUN 37* 36*  CREATININE 1.3 1.4*   Recent Labs    07/11/17 11/11/17  AST 19 11*  ALT 11 11  ALKPHOS 70 112   Recent Labs    07/11/17  WBC 10.9  HGB 11.8*  HCT 35*  PLT 231   Lab Results  Component Value Date   TSH 2.90 11/11/2017   Lab Results  Component Value Date   HGBA1C 5.6 12/06/2015   Lab Results  Component Value Date   CHOL 125 12/07/2015   HDL 42 12/07/2015   LDLCALC 75 12/07/2015  TRIG 38 12/07/2015   CHOLHDL 3.0 12/07/2015    Significant Diagnostic Results in last 30 days:  No results found.  Assessment/Plan  1. Weight loss, unintentional Appears dry on exam. He is likely not drinking enough water and is on HCTZ.  Will discontinue this along with kdur and monitor his weight and volume status. We will also check manual BPs qd x 1 week. The staff was told to encourage oral intake for this resident. He is very disagreeable to lab draws and would likely pull out an IV if ordered. His goals of care are comfort based with no hospitalizations.   2. Vascular dementia with behavior disturbance Progressive decline in cognition and function with agitation at times.  Continue Aricept 10 mg qd, consider discontinuing at the next visit with Dr. Mariea Clonts due to weight loss  3. Chronic combined systolic and diastolic congestive heart failure  ([facility]) See #1  4. Complete heart block [facility]) S/p dual demand pacemaker I asked the staff in May to connect with cardiology regarding his remote transmissions. Will check with them again regarding this issue.  5. Pathological fracture of lumbar vertebra with delayed healing, subsequent encounter Pain is controlled with one norco each morning. He does not like to take supplements and would not tolerate going out for a dexa scan.   6. Severe protein-calorie malnutrition ([place]) Continue unjury 1/2 scoop bid and boost daily   Family/ staff Communication: staff/resident Labs/tests ordered:  NA

## 2018-01-16 NOTE — Assessment & Plan Note (Signed)
Discontinue hctz and kdur due to weight loss and signs of dehydration on exam Monitor weight 3 x weekly Monitor for sob, htn, and edema Check BP qd manual x 1 week and report if >150/90

## 2018-01-20 ENCOUNTER — Encounter: Payer: Self-pay | Admitting: Adult Health

## 2018-01-20 ENCOUNTER — Non-Acute Institutional Stay (SKILLED_NURSING_FACILITY): Payer: Medicare Other | Admitting: Adult Health

## 2018-01-20 DIAGNOSIS — I5042 Chronic combined systolic (congestive) and diastolic (congestive) heart failure: Secondary | ICD-10-CM | POA: Diagnosis not present

## 2018-01-20 DIAGNOSIS — R634 Abnormal weight loss: Secondary | ICD-10-CM

## 2018-01-20 NOTE — Progress Notes (Signed)
Location:  Occupational psychologist of Service:  SNF (31) Provider:   Cindi Carbon, ANP [place] [phone]   Gayland Curry, DO  Patient Care Team: Gayland Curry, DO as PCP - General (Geriatric Medicine)  Extended Emergency Contact Information Primary Emergency Contact: [facility] Address: [address]          [place] [address] [name] of [place] Phone: [phone] Mobile Phone: [phone] Relation: Son Secondary Emergency Contact: [facility] Address: [place], [place] [address] [name] Mobile Phone: [phone] Relation: Daughter  Code Status:  DNR Goals of care: Advanced Directive information Advanced Directives 09/17/2017  Does Patient Have a Medical Advance Directive? Yes  Type of Paramedic of [place];Living will;Out of facility DNR (pink MOST or yellow form)  Does patient want to make changes to medical advance directive? No - Patient declined  Copy of [place] in Chart? Yes  Would patient like information on creating a medical advance directive? -  Pre-existing out of facility DNR order (yellow form or pink MOST form) Yellow form placed in chart (order not valid for inpatient use)     Chief Complaint  Patient presents with  . Acute Visit    weight loss and htn    HPI:  Pt is a 84 y.o. male seen today for an acute visit for weight loss. The nurse reported to me that his weight was down to 75 lbs, form 97 lbs earlier in the month. He had been re weighed and they confirmed the weight/scale/method. He has been on a trajectory of weight loss (8 lbs since May)  due to decreased intake. He appeared dry last week and was taken off hctz. He has not had any increase in edema, sob, or cp. He is eating a fairly good breakfast but very little lunch and dinner. He is taking his supplements as ordered. He has vascular dementia and can be  difficult to redirect. Upon further direction by me he was re weighed and came in at 100.8 lbs, which would be a 3 lb gain. Off HCXT his BP has ranged 108-140/62-77.  He has a hx of combined CHF and complete heart block with pacemaker.    Past Medical History:  Diagnosis Date  . Arthritis   . BPH (benign prostatic hyperplasia)   . CHF (congestive heart failure) ([place])   . Chronic combined systolic and diastolic congestive heart failure ([place]) 05/31/2014  . Complete heart block ([place]) 08/2013   s/p STJ dual chamber pacemaker implant  . Paroxysmal atrial flutter ([place])   . Pathologic lumbar vertebral fracture 07/10/2017  . Prostate cancer ([place])    radiation  . Senile osteoporosis 07/10/2017  . Vascular dementia with behavior disturbance 07/10/2017  . Weight loss, unintentional 07/10/2017   Past Surgical History:  Procedure Laterality Date  . ANTERIOR CRUCIATE LIGAMENT REPAIR Right ~ 1950  . HIP ARTHROPLASTY Left 06/01/2014   Procedure: LEFT HIP HEMI ARTHROPLASTY ;  Surgeon: Renette Butters, MD;  Location: [place];  Service: Orthopedics;  Laterality: Left;  Requesting Larkin Ina or April RNFA  . NM MYOCAR PERF WALL MOTION  07/2010   bruce myoview - perfusion defect in basal-apical inferior region (more at rest than stress) - non-gated (A-Flutter), low risk  . PERMANENT PACEMAKER INSERTION N/A 08/21/2013   STJ dual chamber PPM implanted for CHB  . TONSILLECTOMY  ~ 1935  .  TOTAL HIP ARTHROPLASTY Right 08/2010   Dr. French Ana  . TRANSTHORACIC ECHOCARDIOGRAM  08/2010   EF 55-60%, mod conc hypertrophy, grade 1 diastolic dysfunction, AV sclerosis, mild regurg; RA mildly dilated    No Known Allergies  Outpatient Encounter Medications as of 01/20/2018  Medication Sig  . donepezil (ARICEPT) 10 MG tablet Take 10 mg by mouth at bedtime.  Marland Kitchen HYDROcodone-acetaminophen (NORCO/VICODIN) 5-325 MG tablet Take 1 tablet by mouth every morning.  Marland Kitchen acetaminophen (TYLENOL) 325 MG tablet Take 650 mg by mouth 3 (three) times  daily.   Marland Kitchen lactose free nutrition (BOOST) LIQD Take 237 mLs by mouth daily.  . Multiple Vitamin (MULTIVITAMIN) tablet Take 1 tablet by mouth daily.  . Protein (UNJURY PO) Take 0.5 Scoops by mouth 2 (two) times daily.  . sertraline (ZOLOFT) 100 MG tablet Take 100 mg by mouth daily.  . [DISCONTINUED] donepezil (ARICEPT) 10 MG tablet Take 10 mg by mouth at bedtime.   . [DISCONTINUED] hydrochlorothiazide (MICROZIDE) 12.5 MG capsule TAKE 1 CAPSULE BY MOUTH DAILY  . [DISCONTINUED] potassium chloride (K-DUR,KLOR-CON) 10 MEQ tablet TAKE 1 TABLET BY MOUTH EVERY DAY   No facility-administered encounter medications on file as of 01/20/2018.     Review of Systems  Constitutional: Positive for unexpected weight change. Negative for activity change, appetite change, chills, diaphoresis, fatigue and fever.  Respiratory: Negative for cough, shortness of breath, wheezing and stridor.   Cardiovascular: Negative for chest pain, palpitations and leg swelling.  Gastrointestinal: Negative for abdominal distention, abdominal pain, constipation and diarrhea.  Genitourinary: Negative for difficulty urinating and dysuria.  Musculoskeletal: Positive for gait problem. Negative for arthralgias, back pain, joint swelling and myalgias.  Neurological: Negative for dizziness, seizures, syncope, facial asymmetry, speech difficulty, weakness and headaches.  Hematological: Negative for adenopathy. Does not bruise/bleed easily.  Psychiatric/Behavioral: Positive for agitation, behavioral problems and confusion.    Immunization History  Administered Date(s) Administered  . Influenza-Unspecified 03/08/2010, 04/23/2011, 02/21/2012, 03/27/2013, 02/09/2014, 02/26/2014, 08/08/2017  . Pneumococcal Conjugate-13 03/09/2015  . Pneumococcal Polysaccharide-23 07/11/2009, 07/16/2012  . Td 07/11/2009  . Tdap 06/15/2017  . Zoster 06/28/2009   Pertinent  Health Maintenance Due  Topic Date Due  . INFLUENZA VACCINE  01/09/2018  . PNA  vac Low Risk Adult  Completed   Fall Risk  08/07/2017 07/10/2017  Falls in the past year? No No   Functional Status Survey:    Vitals:   01/20/18 1445  BP: 125/76  Weight: 100 lb 12.8 oz (45.7 kg)   Body mass index is 15.33 kg/m. Physical Exam  Constitutional: No distress.  HENT:  Head: Normocephalic and atraumatic.  Nose: Nose normal.  Mouth/Throat: Oropharynx is clear and moist.  Neck: Neck supple. No JVD present. No tracheal deviation present. No thyromegaly present.  Cardiovascular: Normal rate and regular rhythm.  No murmur heard. No edema  Pulmonary/Chest: Effort normal and breath sounds normal. No respiratory distress. He has no wheezes.  Abdominal: Soft. Bowel sounds are normal. He exhibits no distension. There is no tenderness.  Lymphadenopathy:    He has no cervical adenopathy.  Neurological: He is alert.  Oriented x 2  Skin: Skin is warm and dry. He is not diaphoretic.  Nursing note and vitals reviewed.   Labs reviewed: Recent Labs    07/11/17 11/11/17  NA 143 144  K 3.7 4.2  BUN 37* 36*  CREATININE 1.3 1.4*   Recent Labs    07/11/17 11/11/17  AST 19 11*  ALT 11 11  ALKPHOS 70 112  Recent Labs    07/11/17  WBC 10.9  HGB 11.8*  HCT 35*  PLT 231   Lab Results  Component Value Date   TSH 2.90 11/11/2017   Lab Results  Component Value Date   HGBA1C 5.6 12/06/2015   Lab Results  Component Value Date   CHOL 125 12/07/2015   HDL 42 12/07/2015   LDLCALC 75 12/07/2015   TRIG 38 12/07/2015   CHOLHDL 3.0 12/07/2015    Significant Diagnostic Results in last 30 days:  No results found.  Assessment/Plan   1. Weight loss, unintentional His weight has trended up 3 lbs since stopping the hctz. Continue supplements. Avoid aggressive measures due to his goals of care and underlying dementia. TSH ok  2. Chronic combined systolic and diastolic congestive heart failure (HCC) Monitor weights 3 x weekly BP doing ok off hctz    Family/ staff  Communication: staff/resident  Labs/tests ordered:  NA

## 2018-01-23 ENCOUNTER — Telehealth: Payer: Self-pay | Admitting: *Deleted

## 2018-01-23 NOTE — Telephone Encounter (Signed)
Staff Message received: Royal Hawthorn, NP  Shaneca Orne, Susette Racer, RN        Hello,  I am the NP at [facility]. We have two residents that have pacemakers and I don't see documentation of remote transmission in epic. They both have transmitters in their room. I was wondering if something did not get communicated when the changed from living in independent living to skilled care. The attached patient Justin Orozco is one, the other is -----. Is there anything we need to do on our end? We have called the company and things there seem to be ok, just no documentation in epic? Also, I think they may not be getting their follow up apts with cardiology. Are these done annually?  Thanks     Hi,  Justin Orozco has cancelled his appointments with Chanetta Marshall, NP (07/19/17, 08/28/17). You can call [phone] to arrange a Physician Pacer Check with Amber or with Dr. Rayann Heman as he is overdue. I can see that his monitor is up to date, he got lost to follow up at the end of 2017 due to a failed transmission. We've attempted to contact himbut his mailbox was full. I'll get him back on the remote transmission rotation. As Sricharan Lacomb as his monitor is plugged in within 5 feet of where he sleeps, they should transmit automatically. FYI- Oct 2016 he declined remote monitoring against our recommendations.  Typically, patients follow up with Electrophysiology providers annually.  [facility] RN

## 2018-02-03 ENCOUNTER — Ambulatory Visit (INDEPENDENT_AMBULATORY_CARE_PROVIDER_SITE_OTHER): Payer: Medicare Other | Admitting: *Deleted

## 2018-02-03 DIAGNOSIS — I5042 Chronic combined systolic (congestive) and diastolic (congestive) heart failure: Secondary | ICD-10-CM

## 2018-02-03 DIAGNOSIS — I442 Atrioventricular block, complete: Secondary | ICD-10-CM

## 2018-02-03 NOTE — Progress Notes (Signed)
Remote ICD transmission.   

## 2018-02-04 ENCOUNTER — Encounter: Payer: Self-pay | Admitting: Cardiology

## 2018-02-28 ENCOUNTER — Non-Acute Institutional Stay (SKILLED_NURSING_FACILITY): Payer: Medicare Other | Admitting: Adult Health

## 2018-02-28 ENCOUNTER — Encounter: Payer: Self-pay | Admitting: Adult Health

## 2018-02-28 DIAGNOSIS — M8448XG Pathological fracture, other site, subsequent encounter for fracture with delayed healing: Secondary | ICD-10-CM

## 2018-02-28 DIAGNOSIS — F0151 Vascular dementia with behavioral disturbance: Secondary | ICD-10-CM | POA: Diagnosis not present

## 2018-02-28 DIAGNOSIS — R634 Abnormal weight loss: Secondary | ICD-10-CM | POA: Diagnosis not present

## 2018-02-28 DIAGNOSIS — F01518 Vascular dementia, unspecified severity, with other behavioral disturbance: Secondary | ICD-10-CM

## 2018-02-28 DIAGNOSIS — I442 Atrioventricular block, complete: Secondary | ICD-10-CM | POA: Diagnosis not present

## 2018-02-28 NOTE — Progress Notes (Signed)
Location:  Occupational psychologist of Service:  SNF (31) Provider:   Cindi Carbon, ANP [place] [phone]   Gayland Curry, DO  Patient Care Team: Gayland Curry, DO as PCP - General (Geriatric Medicine)  Extended Emergency Contact Information Primary Emergency Contact: [facility] Address: [address]          [place] [address] [name] of [place] Phone: [phone] Mobile Phone: [phone] Relation: Son Secondary Emergency Contact: [facility] Address: [place], [place] [address] [name] Mobile Phone: [phone] Relation: Daughter  Code Status:  DNR Goals of care: Advanced Directive information Advanced Directives 09/17/2017  Does Patient Have a Medical Advance Directive? Yes  Type of Paramedic of [place];Living will;Out of facility DNR (pink MOST or yellow form)  Does patient want to make changes to medical advance directive? No - Patient declined  Copy of [place] in Chart? Yes  Would patient like information on creating a medical advance directive? -  Pre-existing out of facility DNR order (yellow form or pink MOST form) Yellow form placed in chart (order not valid for inpatient use)     Chief Complaint  Patient presents with  . Medical Management of Chronic Issues    HPI:  Pt is a 84 y.o. male seen today for medical management of chronic diseases.    Vascular dementia: MMSE 24/30 in July Currently on aricept  Weight loss: has lost 8 lbs since April. He has low appetite in the afternoon and he has worked with the nutritionist to find supplements that he likes such as boost or unjury.  Continues with dry mouth and skin. He has tried Remeron in the past and failed.  Complete heart block with hx of pacemaker.  Hx of lumbar compression fracture with previously uncontrolled pain that improved with Norco BID Lumbar spine xray  10/28/17: bony structures are osteoporotic, compression fractures noted at L1-L2 and there is narrowing of L5-S1 intervertebral space   Functional status: periodically incontinent but ambulatory and can get to the bathroom for voiding at times   Past Medical History:  Diagnosis Date  . Arthritis   . BPH (benign prostatic hyperplasia)   . CHF (congestive heart failure) ([place])   . Chronic combined systolic and diastolic congestive heart failure ([place]) 05/31/2014  . Complete heart block ([place]) 08/2013   s/p STJ dual chamber pacemaker implant  . Paroxysmal atrial flutter ([place])   . Pathologic lumbar vertebral fracture 07/10/2017  . Prostate cancer ([place])    radiation  . Senile osteoporosis 07/10/2017  . Vascular dementia with behavior disturbance 07/10/2017  . Weight loss, unintentional 07/10/2017   Past Surgical History:  Procedure Laterality Date  . ANTERIOR CRUCIATE LIGAMENT REPAIR Right ~ 1950  . HIP ARTHROPLASTY Left 06/01/2014   Procedure: LEFT HIP HEMI ARTHROPLASTY ;  Surgeon: Renette Butters, MD;  Location: [place];  Service: Orthopedics;  Laterality: Left;  Requesting Larkin Ina or April RNFA  . NM MYOCAR PERF WALL MOTION  07/2010   bruce myoview - perfusion defect in basal-apical inferior region (more at rest than stress) - non-gated (A-Flutter), low risk  . PERMANENT PACEMAKER INSERTION N/A 08/21/2013   STJ dual chamber PPM implanted for CHB  . TONSILLECTOMY  ~ 1935  . TOTAL HIP ARTHROPLASTY Right 08/2010   Dr. French Ana  . TRANSTHORACIC ECHOCARDIOGRAM  08/2010   EF 55-60%, mod conc hypertrophy, grade  1 diastolic dysfunction, AV sclerosis, mild regurg; RA mildly dilated    No Known Allergies  Outpatient Encounter Medications as of 02/28/2018  Medication Sig  . acetaminophen (TYLENOL) 325 MG tablet Take 650 mg by mouth 3 (three) times daily.   Marland Kitchen donepezil (ARICEPT) 10 MG tablet Take 10 mg by mouth at bedtime.  Marland Kitchen HYDROcodone-acetaminophen (NORCO/VICODIN) 5-325 MG tablet Take 1 tablet by  mouth 2 (two) times daily.   Marland Kitchen lactose free nutrition (BOOST) LIQD Take 237 mLs by mouth 3 (three) times daily with meals.   . Multiple Vitamin (MULTIVITAMIN) tablet Take 1 tablet by mouth daily.  . Protein (UNJURY PO) Take 0.5 Scoops by mouth 2 (two) times daily.  . sertraline (ZOLOFT) 100 MG tablet Take 100 mg by mouth daily.   No facility-administered encounter medications on file as of 02/28/2018.     Review of Systems  Constitutional: Positive for appetite change and unexpected weight change. Negative for activity change, chills, diaphoresis, fatigue and fever.  Respiratory: Negative for cough, choking, shortness of breath, wheezing and stridor.   Cardiovascular: Negative for chest pain, palpitations and leg swelling.  Gastrointestinal: Negative for abdominal distention, abdominal pain, constipation and diarrhea.  Genitourinary: Negative for difficulty urinating and dysuria.       Reports incontinence  Musculoskeletal: Positive for back pain and gait problem. Negative for arthralgias, joint swelling and myalgias.  Skin: Negative for wound.  Neurological: Negative for dizziness, seizures, syncope, facial asymmetry, speech difficulty, weakness and headaches.  Hematological: Negative for adenopathy. Does not bruise/bleed easily.  Psychiatric/Behavioral: Positive for behavioral problems and confusion. Negative for agitation.    Immunization History  Administered Date(s) Administered  . Influenza-Unspecified 03/08/2010, 04/23/2011, 02/21/2012, 03/27/2013, 02/09/2014, 02/26/2014, 08/08/2017  . Pneumococcal Conjugate-13 03/09/2015  . Pneumococcal Polysaccharide-23 07/11/2009, 07/16/2012  . Td 07/11/2009  . Tdap 06/15/2017  . Zoster 06/28/2009   Pertinent  Health Maintenance Due  Topic Date Due  . INFLUENZA VACCINE  01/09/2018  . PNA vac Low Risk Adult  Completed   Fall Risk  08/07/2017 07/10/2017  Falls in the past year? No No   Functional Status Survey:    Vitals:   02/28/18  1210  Weight: 98 lb (44.5 kg)   Body mass index is 14.9 kg/m. Physical Exam  Constitutional: No distress.  HENT:  Head: Normocephalic and atraumatic.  Mouth/Throat: No oropharyngeal exudate.  Very dry mouth  Neck: No JVD present. No tracheal deviation present. No thyromegaly present.  Cardiovascular: Normal rate and regular rhythm.  No murmur heard. irregular  Pulmonary/Chest: Effort normal and breath sounds normal. No respiratory distress. He has no wheezes.  Abdominal: Soft. Bowel sounds are normal. He exhibits no distension. There is no tenderness.  Musculoskeletal: He exhibits no edema or tenderness.  Lymphadenopathy:    He has no cervical adenopathy.  Neurological: He is alert.  Oriented to self and place, not time.   Skin: Skin is warm and dry. He is not diaphoretic.  Psychiatric: He has a normal mood and affect.  Nursing note and vitals reviewed.   Labs reviewed: Recent Labs    07/11/17 11/11/17  NA 143 144  K 3.7 4.2  BUN 37* 36*  CREATININE 1.3 1.4*   Recent Labs    07/11/17 11/11/17  AST 19 11*  ALT 11 11  ALKPHOS 70 112   Recent Labs    07/11/17  WBC 10.9  HGB 11.8*  HCT 35*  PLT 231   Lab Results  Component Value Date   TSH 2.90  11/11/2017   Lab Results  Component Value Date   HGBA1C 5.6 12/06/2015   Lab Results  Component Value Date   CHOL 125 12/07/2015   HDL 42 12/07/2015   LDLCALC 75 12/07/2015   TRIG 38 12/07/2015   CHOLHDL 3.0 12/07/2015    Significant Diagnostic Results in last 30 days:  No results found.  Assessment/Plan  1. Vascular dementia with behavior disturbance Due to his weight loss and dehydration will discontinue the aricept   2. Weight loss, unintentional Monitor weight after aricept is discontinued.  Continue dietary supplements Consider re- trying low dose remeron in the future  3. Complete heart block ([place]) S/p pacemaker, remote transmission now documented in epic  4. Pathological fracture of lumbar  vertebra with delayed healing, subsequent encounter Has chronic low back pain due to this issue. Improved with Norco BID   Family/ staff Communication: discussed with Dr. Mariea Clonts  Labs/tests ordered:  NA

## 2018-03-05 LAB — CUP PACEART REMOTE DEVICE CHECK
Battery Remaining Percentage: 95.5 %
Battery Voltage: 2.98 V
Brady Statistic AP VP Percent: 38 %
Brady Statistic AS VS Percent: 1 %
Brady Statistic RA Percent Paced: 37 %
Brady Statistic RV Percent Paced: 99 %
Implantable Lead Implant Date: 20150313
Implantable Lead Location: 753859
Implantable Lead Location: 753860
Implantable Lead Model: 1948
Implantable Pulse Generator Implant Date: 20150313
Lead Channel Impedance Value: 490 Ohm
Lead Channel Pacing Threshold Amplitude: 1 V
Lead Channel Pacing Threshold Pulse Width: 0.5 ms
Lead Channel Sensing Intrinsic Amplitude: 8.4 mV
Lead Channel Setting Pacing Amplitude: 2 V
MDC IDC LEAD IMPLANT DT: 20150313
MDC IDC MSMT BATTERY REMAINING LONGEVITY: 121 mo
MDC IDC MSMT LEADCHNL RA SENSING INTR AMPL: 5 mV
MDC IDC MSMT LEADCHNL RV IMPEDANCE VALUE: 560 Ohm
MDC IDC MSMT LEADCHNL RV PACING THRESHOLD AMPLITUDE: 0.625 V
MDC IDC MSMT LEADCHNL RV PACING THRESHOLD PULSEWIDTH: 0.5 ms
MDC IDC PG SERIAL: 7596067
MDC IDC SESS DTM: 20190826185113
MDC IDC SET LEADCHNL RV PACING AMPLITUDE: 0.875
MDC IDC SET LEADCHNL RV PACING PULSEWIDTH: 0.5 ms
MDC IDC SET LEADCHNL RV SENSING SENSITIVITY: 5 mV
MDC IDC STAT BRADY AP VS PERCENT: 1 %
MDC IDC STAT BRADY AS VP PERCENT: 62 %

## 2018-03-11 ENCOUNTER — Encounter: Payer: Self-pay | Admitting: Internal Medicine

## 2018-03-11 ENCOUNTER — Non-Acute Institutional Stay (SKILLED_NURSING_FACILITY): Payer: Medicare Other | Admitting: Internal Medicine

## 2018-03-11 DIAGNOSIS — R54 Age-related physical debility: Secondary | ICD-10-CM

## 2018-03-11 DIAGNOSIS — M8448XG Pathological fracture, other site, subsequent encounter for fracture with delayed healing: Secondary | ICD-10-CM

## 2018-03-11 DIAGNOSIS — Z7189 Other specified counseling: Secondary | ICD-10-CM

## 2018-03-11 DIAGNOSIS — I5042 Chronic combined systolic (congestive) and diastolic (congestive) heart failure: Secondary | ICD-10-CM

## 2018-03-11 DIAGNOSIS — R410 Disorientation, unspecified: Secondary | ICD-10-CM

## 2018-03-11 DIAGNOSIS — E43 Unspecified severe protein-calorie malnutrition: Secondary | ICD-10-CM | POA: Diagnosis not present

## 2018-03-11 DIAGNOSIS — F0151 Vascular dementia with behavioral disturbance: Secondary | ICD-10-CM

## 2018-03-11 DIAGNOSIS — F01518 Vascular dementia, unspecified severity, with other behavioral disturbance: Secondary | ICD-10-CM

## 2018-03-11 NOTE — Progress Notes (Signed)
Patient ID: Justin Orozco, male   DOB: 1926/12/26, 84 y.o.   MRN: [id number]  Location:  [place] Room Number: 250 Place of Service:  SNF ([phone]) Provider:   Gayland Curry, DO  Patient Care Team: Justin Curry, DO as PCP - General (Geriatric Medicine)  Extended Emergency Contact Information Primary Emergency Contact: Orozco,Justin Address: [address]          [place] [address] [name] of [place] Phone: [phone] Mobile Phone: [phone] Relation: Son Secondary Emergency Contact: [facility] Address: [address]          [place], [place] [address] [name] Mobile Phone: [phone] Relation: Daughter  Code Status:  DNR Goals of care: Advanced Directive information Advanced Directives 03/11/2018  Does Patient Have a Medical Advance Directive? Yes  Type of Paramedic of [place];Living will;Out of facility DNR (pink MOST or yellow form)  Does patient want to make changes to medical advance directive? No - Patient declined  Copy of [place] in Chart? Yes  Would patient like information on creating a medical advance directive? -  Pre-existing out of facility DNR order (yellow form or pink MOST form) Yellow form placed in chart (order not valid for inpatient use);Pink MOST form placed in chart (order not valid for inpatient use)   Chief Complaint  Patient presents with  . Acute Visit    change in mental status    HPI:  Pt is a 84 y.o. male with h/o osteoporosis, lumbar vertebral fracture, prostate cancer s/p xrt, frailty and vascular dementia seen today for an acute visit for increased confusion, lethargy, complete change in condition over the past 2 weeks.  He has not eaten well for over a year, but in the past day, he's had just a few bites of food.  I met with his daughter today.  In the past two weeks, he's not been retelling stories like he'd been.    He is  drinking some orange juice with protein added and other beverages.  He's been so weak, he's unable to hold himself up in bed.  His back pain has been controlled.  He is not c/o pain, but he is off and on alert and sleepy.  He is not concerned about combing his hair, has been resistant to care (like being shaven) which is totally unlike him.  He's been talking out loud when no one is in the room (?hallucinating). Comfort goals have already been set in place at a prior meeting with his son, Justin Orozco.  His daughter is also on board with this plan.  We discussed the option of attempting to get blood work only to determine if a potentially reversible cause was contributing to his delirium on dementia.  He is typically resistant to bloodwork and testing and due to this and his overall declining condition even before these past two weeks, family impression that he'd also be embarrassed and humiliated by his current state and would not want to live that way, we opted not to investigate his current condition further, but rather to manage symptoms and keep him comfortable.  His daughter was going to call her brother and update him.  I actually ran into him on my way out of the facility and he also agreed with the plan.  I discussed that I would be sure to narrow his med list to comfort only (was only taking pain medication and zoloft).  His daughter  also asked that we offer to feed him, and it's ok if he refuses or resists and staff do not have to "force the issue", but at least to try if he's alert enough to take oral intake.  This remains the case right now. We discussed his prognosis is likely in days to weeks depending on how his intake changes over the next few days.        Past Medical History:  Diagnosis Date  . Arthritis   . BPH (benign prostatic hyperplasia)   . CHF (congestive heart failure) ([place])   . Chronic combined systolic and diastolic congestive heart failure ([place]) 05/31/2014  . Complete heart block ([place])  08/2013   s/p STJ dual chamber pacemaker implant  . Paroxysmal atrial flutter ([place])   . Pathologic lumbar vertebral fracture 07/10/2017  . Prostate cancer ([place])    radiation  . Senile osteoporosis 07/10/2017  . Vascular dementia with behavior disturbance ([place]) 07/10/2017  . Weight loss, unintentional 07/10/2017   Past Surgical History:  Procedure Laterality Date  . ANTERIOR CRUCIATE LIGAMENT REPAIR Right ~ 1950  . HIP ARTHROPLASTY Left 06/01/2014   Procedure: LEFT HIP HEMI ARTHROPLASTY ;  Surgeon: Renette Butters, MD;  Location: [place];  Service: Orthopedics;  Laterality: Left;  Requesting Larkin Ina or April RNFA  . NM MYOCAR PERF WALL MOTION  07/2010   bruce myoview - perfusion defect in basal-apical inferior region (more at rest than stress) - non-gated (A-Flutter), low risk  . PERMANENT PACEMAKER INSERTION N/A 08/21/2013   STJ dual chamber PPM implanted for CHB  . TONSILLECTOMY  ~ 1935  . TOTAL HIP ARTHROPLASTY Right 08/2010   Dr. French Ana  . TRANSTHORACIC ECHOCARDIOGRAM  08/2010   EF 55-60%, mod conc hypertrophy, grade 1 diastolic dysfunction, AV sclerosis, mild regurg; RA mildly dilated    No Known Allergies  Outpatient Encounter Medications as of 03/11/2018  Medication Sig  . acetaminophen (TYLENOL) 325 MG tablet Take 650 mg by mouth 2 (two) times daily.   Marland Kitchen HYDROcodone-acetaminophen (NORCO/VICODIN) 5-325 MG tablet Take 1 tablet by mouth 2 (two) times daily.   Marland Kitchen lactose free nutrition (BOOST) LIQD Take 237 mLs by mouth 3 (three) times daily with meals.   . Multiple Vitamin (MULTIVITAMIN) tablet Take 1 tablet by mouth daily.  . Protein (UNJURY PO) Take 0.5 Scoops by mouth 2 (two) times daily.  . sertraline (ZOLOFT) 100 MG tablet Take 100 mg by mouth daily.  . [DISCONTINUED] donepezil (ARICEPT) 10 MG tablet Take 10 mg by mouth at bedtime.   No facility-administered encounter medications on file as of 03/11/2018.     Review of Systems  Reason unable to perform ROS: obtained from his  daughter and SNF nurse.  Constitutional: Positive for activity change, appetite change and fatigue. Negative for fever.  HENT: Negative for congestion.   Respiratory: Negative for shortness of breath.   Gastrointestinal: Negative for constipation.  Genitourinary: Negative for hematuria.  Musculoskeletal: Positive for back pain and gait problem.       Denying pain now  Skin: Positive for pallor.       Ashen color  Neurological: Positive for weakness.  Psychiatric/Behavioral: Positive for agitation, confusion and hallucinations.    Immunization History  Administered Date(s) Administered  . Influenza-Unspecified 03/08/2010, 04/23/2011, 02/21/2012, 03/27/2013, 02/09/2014, 02/26/2014, 08/08/2017  . Pneumococcal Conjugate-13 03/09/2015  . Pneumococcal Polysaccharide-23 07/11/2009, 07/16/2012  . Td 07/11/2009  . Tdap 06/15/2017  . Zoster 06/28/2009   Pertinent  Health Maintenance Due  Topic Date Due  .  INFLUENZA VACCINE  01/09/2018  . PNA vac Low Risk Adult  Completed   Fall Risk  08/07/2017 07/10/2017  Falls in the past year? No No   Functional Status Survey:    Vitals:   03/11/18 1350  BP: 132/70  Pulse: 71  Resp: 17  Temp: (!) 97.4 F (36.3 C)  TempSrc: Oral  SpO2: 95%  Weight: 97 lb (44 kg)  Height: _0  (1.753 m)   Body mass index is 14.32 kg/m. Physical Exam  Constitutional:  Frail cachectic mail sitting on bed about to be transferred to chair to attempt meal, unable to support his upper body so leaning back on bed on elbows; disheveled  Cardiovascular: Normal rate, regular rhythm and normal heart sounds.  Pulmonary/Chest: Effort normal and breath sounds normal. No respiratory distress.  Musculoskeletal: Normal range of motion.  Requiring staff to help with transfers, push him in wheelchair and even feed him now  Neurological:  Intermittently alert and drowsy; not usual talkative self   Skin: Skin is warm and dry. There is pallor.  Grayish tone to skin    Psychiatric:  Appears distracted (?hallucinations), looking upward    Labs reviewed: Recent Labs    07/11/17 11/11/17  NA 143 144  K 3.7 4.2  BUN 37* 36*  CREATININE 1.3 1.4*   Recent Labs    07/11/17 11/11/17  AST 19 11*  ALT 11 11  ALKPHOS 70 112   Recent Labs    07/11/17  WBC 10.9  HGB 11.8*  HCT 35*  PLT 231   Lab Results  Component Value Date   TSH 2.90 11/11/2017   Lab Results  Component Value Date   HGBA1C 5.6 12/06/2015   Lab Results  Component Value Date   CHOL 125 12/07/2015   HDL 42 12/07/2015   LDLCALC 75 12/07/2015   TRIG 38 12/07/2015   CHOLHDL 3.0 12/07/2015   Assessment/Plan 1. Delirium -seems this is a sign he is near end of life -educated his daughter and son about his condition -they are in favor of full comfort care for him -due to pt's overall decline even prior to these 2 wks, his children have opted not to investigate for a cause like infection at this point  2. Vascular dementia with behavior disturbance (HCC) -has had significant decline in past two weeks -continue purely comfort care -stop zoloft now due to difficulty with po intake and lethargy  3. Frailty syndrome in geriatric patient -ongoing, but much progressed  4. Severe protein-calorie malnutrition ([place]) -staff will cont to attempt to feed him when he desires and when he's alert enough to swallow  5. Chronic combined systolic and diastolic congestive heart failure (HCC) -does not have signs of over acute chf at this time and intake has been so minimal that he's not been requiring any diuretics for a long long time  6. Pathological fracture of lumbar vertebra with delayed healing, subsequent encounter -back pain has been controlled with current regimen, will require change to liquid shortly  7.  ACP:  20 mins spent with pt and his daughter discussing ACP.    Family/ staff Communication: discussed with his daughter, Daine Floras, and son, Justin Orozco, as well as SNF  nurse  Labs/tests ordered:  Comfort care  Lynn. Limmie Schoenberg, D.O. [facility] [address], [place] [address] Cell Phone (Mon-Fri 8am-5pm):  [phone] On Call:  [phone] & follow prompts after 5pm & weekends Office Phone:  [phone] Office  Fax:  [phone]

## 2018-03-14 NOTE — ACP (Advance Care Planning) (Signed)
Pt is a 84 y.o. male with h/o osteoporosis, lumbar vertebral fracture, prostate cancer s/p xrt, frailty and vascular dementia seen today for an acute visit for increased confusion, lethargy, complete change in condition over the past 2 weeks.  He has not eaten well for over a year, but in the past day, he's had just a few bites of food.  I met with his daughter today.  In the past two weeks, he's not been retelling stories like he'd been.    He is drinking some orange juice with protein added and other beverages.  He's been so weak, he's unable to hold himself up in bed.  His back pain has been controlled.  He is not c/o pain, but he is off and on alert and sleepy.  He is not concerned about combing his hair, has been resistant to care (like being shaven) which is totally unlike him.  He's been talking out loud when no one is in the room (?hallucinating). Comfort goals have already been set in place at a prior meeting with his son, [name].  His daughter is also on board with this plan.  We discussed the option of attempting to get blood work only to determine if a potentially reversible cause was contributing to his delirium on dementia.  He is typically resistant to bloodwork and testing and due to this and his overall declining condition even before these past two weeks, family impression that he'd also be embarrassed and humiliated by his current state and would not want to live that way, we opted not to investigate his current condition further, but rather to manage symptoms and keep him comfortable.  His daughter was going to call her brother and update him.  I actually ran into him on my way out of the facility and he also agreed with the plan.  I discussed that I would be sure to narrow his med list to comfort only (was only taking pain medication and zoloft).  His daughter also asked that we offer to feed him, and it's ok if he refuses or resists and staff do not have to "force the issue", but at least to  try if he's alert enough to take oral intake.  This remains the case right now. We discussed his prognosis is likely in days to weeks depending on how his intake changes over the next few days.    Has MOST, DNR, living will, HCPOA.  MOST not scanned in system at present.    20 mins spent with pt and his daughter discussing ACP.

## 2018-04-04 ENCOUNTER — Encounter: Payer: Self-pay | Admitting: Adult Health

## 2018-04-04 ENCOUNTER — Non-Acute Institutional Stay (SKILLED_NURSING_FACILITY): Payer: Medicare Other | Admitting: Adult Health

## 2018-04-04 DIAGNOSIS — E43 Unspecified severe protein-calorie malnutrition: Secondary | ICD-10-CM | POA: Diagnosis not present

## 2018-04-04 DIAGNOSIS — M8448XG Pathological fracture, other site, subsequent encounter for fracture with delayed healing: Secondary | ICD-10-CM

## 2018-04-04 NOTE — Progress Notes (Signed)
Location:  Occupational psychologist of Service:  SNF (31) Provider:   Cindi Carbon, ANP [place] [phone]   Gayland Curry, DO  Patient Care Team: Gayland Curry, DO as PCP - General (Geriatric Medicine)  Extended Emergency Contact Information Primary Emergency Contact: [facility] Address: [address]          [place] [address] [name] Phone: [phone] Mobile Phone: [phone] Relation: Son Secondary Emergency Contact: [facility] Address: [place], [place] [address] [name] Mobile Phone: [phone] Relation: Daughter  Code Status:  DNR Goals of care: Advanced Directive information Advanced Directives 03/11/2018  Does Patient Have a Medical Advance Directive? Yes  Type of Paramedic of [place];Living will;Out of facility DNR (pink MOST or yellow form)  Does patient want to make changes to medical advance directive? No - Patient declined  Copy of [employer] in Chart? Yes  Would patient like information on creating a medical advance directive? -  Pre-existing out of facility DNR order (yellow form or pink MOST form) Yellow form placed in chart (order not valid for inpatient use);Pink MOST form placed in chart (order not valid for inpatient use)     Chief Complaint  Patient presents with  . Acute Visit    acute back pain    HPI:  Pt is a 84 y.o. male seen today for an acute visit for back pain. He has a hx of lumbar compression fracture and has chronic back pain. He is currently on Norco 1 tab BID. He has been losing weight due to decreased intake and underlying progressive vascular dementia. His daughter is at the bedside and is requesting something stronger for pain and would like him to be comfortable. He is having significant mid and low back and is having difficulty turning due to pain. He is not able to get out of bed  this morning and appears more confused and weak. His goals of care are comfort based, DNR with most form and no hospitalizations.    Past Medical History:  Diagnosis Date  . Arthritis   . BPH (benign prostatic hyperplasia)   . CHF (congestive heart failure) ([place])   . Chronic combined systolic and diastolic congestive heart failure ([place]) 05/31/2014  . Complete heart block ([place]) 08/2013   s/p STJ dual chamber pacemaker implant  . Paroxysmal atrial flutter ([place])   . Pathologic lumbar vertebral fracture 07/10/2017  . Prostate cancer ([place])    radiation  . Senile osteoporosis 07/10/2017  . Vascular dementia with behavior disturbance ([place]) 07/10/2017  . Weight loss, unintentional 07/10/2017   Past Surgical History:  Procedure Laterality Date  . ANTERIOR CRUCIATE LIGAMENT REPAIR Right ~ 1950  . HIP ARTHROPLASTY Left 06/01/2014   Procedure: LEFT HIP HEMI ARTHROPLASTY ;  Surgeon: Renette Butters, MD;  Location: [place] [facility];  Service: Orthopedics;  Laterality: Left;  Requesting Larkin Ina or April RNFA  . NM MYOCAR PERF WALL MOTION  07/2010   bruce myoview - perfusion defect in basal-apical inferior region (more at rest than stress) - non-gated (A-Flutter), low risk  . PERMANENT PACEMAKER INSERTION N/A 08/21/2013   STJ dual chamber PPM implanted for CHB  . TONSILLECTOMY  ~ 1935  . TOTAL HIP ARTHROPLASTY Right 08/2010   Dr. French Ana  . TRANSTHORACIC ECHOCARDIOGRAM  08/2010   EF 55-60%, mod conc hypertrophy, grade 1 diastolic dysfunction, AV sclerosis,  mild regurg; RA mildly dilated    No Known Allergies  Outpatient Encounter Medications as of 04/04/2018  Medication Sig  . acetaminophen (TYLENOL) 325 MG tablet Take 650 mg by mouth 2 (two) times daily.   Marland Kitchen HYDROcodone-acetaminophen (NORCO/VICODIN) 5-325 MG tablet Take 1 tablet by mouth 2 (two) times daily.   Marland Kitchen lactose free nutrition (BOOST) LIQD Take 237 mLs by mouth 3 (three) times daily with meals.   . [DISCONTINUED] Multiple Vitamin (MULTIVITAMIN) tablet  Take 1 tablet by mouth daily.  . [DISCONTINUED] Protein (UNJURY PO) Take 0.5 Scoops by mouth 2 (two) times daily.  . [DISCONTINUED] sertraline (ZOLOFT) 100 MG tablet Take 100 mg by mouth daily.   No facility-administered encounter medications on file as of 04/04/2018.     Review of Systems  Unable to perform ROS: Dementia    Immunization History  Administered Date(s) Administered  . Influenza-Unspecified 03/08/2010, 04/23/2011, 02/21/2012, 03/27/2013, 02/09/2014, 02/26/2014, 08/08/2017  . Pneumococcal Conjugate-13 03/09/2015  . Pneumococcal Polysaccharide-23 07/11/2009, 07/16/2012  . Td 07/11/2009  . Tdap 06/15/2017  . Zoster 06/28/2009   Pertinent  Health Maintenance Due  Topic Date Due  . INFLUENZA VACCINE  01/09/2018  . PNA vac Low Risk Adult  Completed   Fall Risk  08/07/2017 07/10/2017  Falls in the past year? No No   Functional Status Survey:    There were no vitals filed for this visit. There is no height or weight on file to calculate BMI. Physical Exam  Constitutional: He appears distressed (grimacing due to pain).  Thin and frail male with very dry mucous membranes  HENT:  Head: Normocephalic and atraumatic.  Cardiovascular: Normal rate and regular rhythm.  No murmur heard. Pulmonary/Chest: Effort normal and breath sounds normal. No respiratory distress. He has no wheezes.  Abdominal: Soft. Bowel sounds are normal. He exhibits no distension. There is no tenderness.  Musculoskeletal: He exhibits tenderness (lumbar spine, hurts to turn) and deformity (kyphosis). He exhibits no edema.  Neurological: He is alert.  Oriented to self and place but not time  Skin: Skin is warm and dry. He is not diaphoretic. There is pallor.  Psychiatric: He has a normal mood and affect.  Nursing note and vitals reviewed.   Labs reviewed: Recent Labs    07/11/17 11/11/17  NA 143 144  K 3.7 4.2  BUN 37* 36*  CREATININE 1.3 1.4*   Recent Labs    07/11/17 11/11/17  AST 19 11*    ALT 11 11  ALKPHOS 70 112   Recent Labs    07/11/17  WBC 10.9  HGB 11.8*  HCT 35*  PLT 231   Lab Results  Component Value Date   TSH 2.90 11/11/2017   Lab Results  Component Value Date   HGBA1C 5.6 12/06/2015   Lab Results  Component Value Date   CHOL 125 12/07/2015   HDL 42 12/07/2015   LDLCALC 75 12/07/2015   TRIG 38 12/07/2015   CHOLHDL 3.0 12/07/2015    Significant Diagnostic Results in last 30 days:  No results found.  Assessment/Plan  1. Pathological fracture of lumbar vertebra with delayed healing, subsequent encounter D/C Norco Roxanol 5 mg TID scheduled and q 2 hrs prn pain Please obtain air mattress for comfort  2. Severe protein-calorie malnutrition ([place]) Significant weight loss associated with progressive dementia. No longer able to take supplements. End of life.    Family/ staff Communication: daughter  Labs/tests ordered:  NA

## 2018-04-10 DIAGNOSIS — Z23 Encounter for immunization: Secondary | ICD-10-CM | POA: Diagnosis not present

## 2018-04-11 DEATH — deceased
